# Patient Record
Sex: Female | Born: 1955 | Race: White | Hispanic: No | Marital: Married
Health system: Midwestern US, Academic
[De-identification: ages and names within clinical notes are randomized; demographics above are authoritative.]

---

## 2016-08-02 ENCOUNTER — Encounter: Admit: 2016-08-02 | Discharge: 2016-08-02 | Payer: Private Health Insurance - Indemnity

## 2016-08-02 NOTE — Telephone Encounter
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 Per her PCP's note, she also has chronic leukopenia with her most recent WBC count of 4.0 on 07/21/16, decreased from 4.7 on 05/18/16. She is now being referred to hematology for further evaluation.

## 2016-08-08 ENCOUNTER — Encounter: Admit: 2016-08-08 | Discharge: 2016-08-08 | Payer: Private Health Insurance - Indemnity

## 2016-08-08 DIAGNOSIS — E785 Hyperlipidemia, unspecified: Principal | ICD-10-CM

## 2016-08-08 DIAGNOSIS — Z87891 Personal history of nicotine dependence: ICD-10-CM

## 2016-08-08 DIAGNOSIS — D696 Thrombocytopenia, unspecified: ICD-10-CM

## 2016-08-08 DIAGNOSIS — N904 Leukoplakia of vulva: ICD-10-CM

## 2016-08-08 DIAGNOSIS — Z0389 Encounter for observation for other suspected diseases and conditions ruled out: ICD-10-CM

## 2016-08-08 DIAGNOSIS — D693 Immune thrombocytopenic purpura: ICD-10-CM

## 2016-08-08 DIAGNOSIS — D72819 Decreased white blood cell count, unspecified: ICD-10-CM

## 2016-08-08 DIAGNOSIS — I10 Essential (primary) hypertension: ICD-10-CM

## 2016-08-08 DIAGNOSIS — I519 Heart disease, unspecified: ICD-10-CM

## 2016-08-08 LAB — CBC AND DIFF
Lab: 0.1 10*3/uL (ref 0–0.20)
Lab: 0.1 10*3/uL (ref 0–0.45)
Lab: 0.4 10*3/uL (ref 0–0.80)
Lab: 1.3 10*3/uL (ref 1.0–4.8)
Lab: 29 % (ref 24–44)
Lab: 4 M/UL (ref 4.0–5.0)
Lab: 4.6 10*3/uL (ref 4.5–11.0)
Lab: 7.5 FL (ref 7–11)

## 2016-08-09 LAB — TSH WITH FREE T4 REFLEX: Lab: 1.5 uU/mL (ref 0.35–5.00)

## 2016-08-09 LAB — HEPATITIS PANEL, ACUTE
Lab: NEGATIVE % (ref 0–2)
Lab: NEGATIVE % (ref 0–5)
Lab: NEGATIVE % (ref 4–12)

## 2016-08-09 LAB — VITAMIN B12: Lab: 313 pg/mL — ABNORMAL LOW (ref 180–914)

## 2016-08-09 LAB — IRON + BINDING CAPACITY + %SAT+ FERRITIN
Lab: 207 ng/mL — ABNORMAL HIGH (ref 10–200)
Lab: 362 ug/dL (ref 270–380)
Lab: 75 ug/dL (ref 50–160)

## 2016-08-09 LAB — PERIPHERAL SMEAR

## 2016-08-09 LAB — FOLATE, SERUM: Lab: 12 ng/mL — ABNORMAL LOW (ref >3.9)

## 2016-08-16 ENCOUNTER — Encounter: Admit: 2016-08-16 | Discharge: 2016-08-16 | Payer: Private Health Insurance - Indemnity

## 2016-08-16 MED ORDER — LOSARTAN 25 MG PO TAB
25 mg | ORAL_TABLET | Freq: Every day | ORAL | 3 refills | 90.00000 days | Status: AC
Start: 2016-08-16 — End: 2017-03-23

## 2016-10-26 ENCOUNTER — Ambulatory Visit: Admit: 2016-10-26 | Discharge: 2016-10-26 | Payer: Private Health Insurance - Indemnity

## 2016-10-26 ENCOUNTER — Encounter: Admit: 2016-10-26 | Discharge: 2016-10-26 | Payer: Private Health Insurance - Indemnity

## 2016-10-26 DIAGNOSIS — I519 Heart disease, unspecified: ICD-10-CM

## 2016-10-26 DIAGNOSIS — N904 Leukoplakia of vulva: ICD-10-CM

## 2016-10-26 DIAGNOSIS — I10 Essential (primary) hypertension: ICD-10-CM

## 2016-10-26 DIAGNOSIS — Z0389 Encounter for observation for other suspected diseases and conditions ruled out: ICD-10-CM

## 2016-10-26 DIAGNOSIS — D693 Immune thrombocytopenic purpura: ICD-10-CM

## 2016-10-26 DIAGNOSIS — Z1231 Encounter for screening mammogram for malignant neoplasm of breast: Principal | ICD-10-CM

## 2016-10-26 DIAGNOSIS — D72819 Decreased white blood cell count, unspecified: ICD-10-CM

## 2016-10-26 DIAGNOSIS — E785 Hyperlipidemia, unspecified: Principal | ICD-10-CM

## 2016-10-26 NOTE — Progress Notes
Date of Service: 10/26/2016    Subjective:             Teresa Trevino is a 61 y.o. female.    History of Present Illness  For 6 month f/u. Flare LS started 10/11/16. Sore hard area with white pimple on right inner labia minora. Pain largely resolved. On clobetasol BID since. No bleeding,itching or dc     Review of Systems   Constitutional: Negative.  Negative for fatigue, fever and unexpected weight change.   HENT: Negative.    Eyes: Negative.    Respiratory: Negative.  Negative for cough and shortness of breath.    Cardiovascular: Negative.  Negative for chest pain and leg swelling.   Gastrointestinal: Negative.  Negative for abdominal pain, blood in stool, constipation, diarrhea, nausea and vomiting.   Endocrine: Negative.    Genitourinary: Negative.  Negative for difficulty urinating, dyspareunia, dysuria, enuresis, frequency, genital sores, hematuria, menstrual problem, pelvic pain, urgency, vaginal bleeding, vaginal discharge and vaginal pain.   Musculoskeletal: Negative.  Negative for arthralgias and back pain.   Skin: Negative.    Allergic/Immunologic: Negative.    Neurological: Negative.  Negative for light-headedness and headaches.   Hematological: Negative.  Negative for adenopathy. Does not bruise/bleed easily.   Psychiatric/Behavioral: Negative.  Negative for confusion. The patient is not nervous/anxious.    All other systems reviewed and are negative.    Past Medical History:   Diagnosis Date   ??? Chronic leukopenia    ??? Heart disease    ??? Hyperlipemia 08/20/2015   ??? Hypertension    ??? ITP (idiopathic thrombocytopenic purpura)     as a kid, resolved   ??? Lichen sclerosus et atrophicus of the vulva 09/22/2015   ??? Observation for suspected cardiovascular disease 08/20/2015    04/21/15  Stress Echo :  1.  No exercise induced chest pain.  2.  No ECG evidence of ischemia.  3.  No echocardiographic evidence of ischemia.  4.  Normal  Hemodynamic response to exercise.  5.  No significant exercise induced arrhythmias.  6.  Low risk scan.  7.  Consider medical management if indicated.     Past Surgical History:   Procedure Laterality Date   ??? HX HYSTERECTOMY  2008   ??? BLADDER SURGERY  2013    bladder sling   ??? COLONOSCOPY     ??? FOOT SURGERY     ??? TONSILLECTOMY       Social History   Substance Use Topics   ??? Smoking status: Former Smoker     Types: Cigarettes   ??? Smokeless tobacco: Never Used      Comment: only smoked very briefly asa teen   ??? Alcohol use 1.0 oz/week     2 Standard drinks or equivalent per week     Family History   Problem Relation Age of Onset   ??? Diabetes Mother    ??? Hypertension Mother    ??? High Cholesterol Mother    ??? Stroke Mother    ??? Thyroid Disease Mother    ??? Kidney Failure Mother         on HD   ??? Diabetes Father         since age 3   ??? Heart Failure Father    ??? Diabetes Paternal Uncle    ??? Cancer Maternal Grandmother    ??? Diabetes Maternal Grandfather    ??? None Reported Sister          Objective:         ???  ALPRAZolam (XANAX) 0.25 mg tablet Take 0.25 mg by mouth at bedtime as needed.   ??? clobetasol (TEMOVATE) 0.05 % topical ointment Apply to vulva for flares twice daily for 4 weeks then taper as directed   ??? ergocalciferol (VITAMIN D-2) 50,000 unit capsule Take 1 Cap by mouth every 7 days.   ??? estrogens, conjugated(+) (PREMARIN) 0.625 mg/g vaginal cream Apply 0.5 gms to the vaginal opening daily for 2 weeks then twice weekly for maintenance   ??? losartan (COZAAR) 25 mg tablet Take 1 tablet by mouth daily.   ??? omeprazole DR(+) (PRILOSEC) 20 mg capsule Take 20 mg by mouth daily before breakfast.   ??? rosuvastatin (CRESTOR) 20 mg tablet Take 1 tablet by mouth daily.     Vitals:    10/26/16 1325   Weight: 88.7 kg (195 lb 9.6 oz)   Height: 176.8 cm (69.6)     Body mass index is 28.39 kg/m???.     Physical Exam  Pleasant female in NAD  Normal external female genitalia  Vulva wickham's striae at hart's line bilaterally without mass  Vagina physiologic d/c no lesions Cervix uterus and adnexa absent           Assessment and Plan:  LS flare. topifcal clobetasol BID x 2 more weeks then taper as directed. Recommend BIW application clobetasol at baseline to prevent VIN/SCCA

## 2016-11-15 ENCOUNTER — Encounter: Admit: 2016-11-15 | Discharge: 2016-11-15 | Payer: Private Health Insurance - Indemnity

## 2016-11-15 DIAGNOSIS — E781 Pure hyperglyceridemia: Principal | ICD-10-CM

## 2016-11-24 ENCOUNTER — Encounter: Admit: 2016-11-24 | Discharge: 2016-11-24 | Payer: Private Health Insurance - Indemnity

## 2016-11-24 DIAGNOSIS — E781 Pure hyperglyceridemia: Principal | ICD-10-CM

## 2016-11-24 LAB — LIPID PROFILE
Lab: 155
Lab: 210
Lab: 38
Lab: 113

## 2016-11-24 NOTE — Progress Notes
Labs are to be reviewed at the patients upcoming office visit.  , RN

## 2016-12-19 ENCOUNTER — Encounter: Admit: 2016-12-19 | Discharge: 2016-12-19 | Payer: Private Health Insurance - Indemnity

## 2016-12-19 DIAGNOSIS — R69 Illness, unspecified: Principal | ICD-10-CM

## 2016-12-20 ENCOUNTER — Ambulatory Visit: Admit: 2016-12-20 | Discharge: 2016-12-20 | Payer: Private Health Insurance - Indemnity

## 2016-12-20 DIAGNOSIS — Z1231 Encounter for screening mammogram for malignant neoplasm of breast: Principal | ICD-10-CM

## 2016-12-23 ENCOUNTER — Encounter: Admit: 2016-12-23 | Discharge: 2016-12-23 | Payer: Private Health Insurance - Indemnity

## 2016-12-23 DIAGNOSIS — R928 Other abnormal and inconclusive findings on diagnostic imaging of breast: Principal | ICD-10-CM

## 2017-01-04 ENCOUNTER — Encounter: Admit: 2017-01-04 | Discharge: 2017-01-04 | Payer: Private Health Insurance - Indemnity

## 2017-01-04 ENCOUNTER — Ambulatory Visit: Admit: 2017-01-04 | Discharge: 2017-01-05 | Payer: Private Health Insurance - Indemnity

## 2017-01-04 ENCOUNTER — Ambulatory Visit: Admit: 2017-01-04 | Discharge: 2017-01-04 | Payer: Private Health Insurance - Indemnity

## 2017-01-04 DIAGNOSIS — R928 Other abnormal and inconclusive findings on diagnostic imaging of breast: Principal | ICD-10-CM

## 2017-01-04 NOTE — Progress Notes
Helped pt get scheduled for biopsy 01/19/17 Verified time, date and location of service. Pt agreed to arrive at 1230 for a 1300 procedure start time. Pt educated on procedure and post-procedure care. Confirmed that pt does not take ASA or Anticoagulant. Urged pt to eat breakfast and wear a supportive bra to appointment. Pt states understanding and denies any questions or concerns at this time.

## 2017-01-17 NOTE — Progress Notes
Called pt to confirm appointment with [facility] on 01/19/17. Pt did not answer. Left message containing date, time, address, call back number. Asked pt to arrive at 1230 for a 1300 procedure start time.

## 2017-01-18 ENCOUNTER — Encounter: Admit: 2017-01-18 | Discharge: 2017-01-18 | Payer: Private Health Insurance - Indemnity

## 2017-01-18 DIAGNOSIS — N63 Unspecified lump in unspecified breast: Principal | ICD-10-CM

## 2017-01-19 ENCOUNTER — Ambulatory Visit: Admit: 2017-01-19 | Discharge: 2017-01-20 | Payer: Private Health Insurance - Indemnity

## 2017-01-19 ENCOUNTER — Ambulatory Visit: Admit: 2017-01-19 | Discharge: 2017-01-19 | Payer: Private Health Insurance - Indemnity

## 2017-01-19 DIAGNOSIS — N63 Unspecified lump in unspecified breast: Principal | ICD-10-CM

## 2017-01-19 DIAGNOSIS — R928 Other abnormal and inconclusive findings on diagnostic imaging of breast: ICD-10-CM

## 2017-01-19 MED ORDER — LIDOCAINE 1% (BUFFERED) SYRINGE (BREAST CENTER)
1 mL | Freq: Once | INTRAMUSCULAR | 0 refills | Status: CP
Start: 2017-01-19 — End: ?

## 2017-01-19 MED ORDER — LIDOCAINE 1%-EPINEPHRINE 1:100000 (BUFFERED) VIAL (BREAST CENTER)
7 mL | Freq: Once | INTRAMUSCULAR | 0 refills | Status: CP
Start: 2017-01-19 — End: ?

## 2017-01-19 NOTE — Progress Notes
1320- Pt arrived in [place] in stable condition. Pt educated on Ultrasound Breast Biopsy, procedure, possible complications. Pt states understanding and denies questions or concerns. Pt verified does not take aspirin or anticoagulant.   1340- Consent signes with Dr. Francesco Sor, Pt, this RN. Skin above right breast marked per protocol. Pt denies questions at this time, agrees to proceed.  1410- Stereotactic biopsy complete, pressure held to biopsy site x10 minutes. Insicion closed with skin glue. Discharge instructions given at this time. Pt provided with discharge instruction sheet, phone number and Ice pack. Post clip placement mammogram to follow.

## 2017-01-20 ENCOUNTER — Encounter: Admit: 2017-01-20 | Discharge: 2017-01-20 | Payer: Private Health Insurance - Indemnity

## 2017-01-20 NOTE — Progress Notes
I spoke with Teresa Trevino and relayed her pathology results.  Unfortunately, she has been diagnosed with breast cancer. Emotional support provided, and I gave her my call back number for any questions prior to her appointment.  I informed her that I have relayed this information to her ordering provider.  She verbalized understanding and all of her questions were answered.  I transferred her to Breast Nurse Navigation, so that she could make the appropriate appointments.

## 2017-01-24 ENCOUNTER — Encounter: Admit: 2017-01-24 | Discharge: 2017-01-24 | Payer: Private Health Insurance - Indemnity

## 2017-01-24 DIAGNOSIS — R69 Illness, unspecified: Principal | ICD-10-CM

## 2017-01-24 NOTE — Telephone Encounter
Navigation Intake Assessment    Patient Name:  Teresa Trevino  DOB: October 22, 1955  Insurance:  [facility]  Direct Referral: NA  Appointment Info:    Future Appointments   Date Time Provider Department Center   01/25/2017 10:15 AM Ruthy Dick, DO CCC2 [place] Exam   01/26/2017  1:20 PM Alwyn Pea, DO [facility] [place] Exam   01/30/2017  2:00 PM Sabino Gasser, MD [facility] [place]   05/17/2017  1:30 PM Tilden Fossa, MD [facility]         Diagnosis & Reason for Visit:  Invasive ductal carcinoma, right breast, grade 3    Physician Info:  ? Referring Physician:  Rockwell Germany PA  ??? Contact Name & Number:  [phone]  ??? Surgeon:  NA  ??? Medical Oncologist:  NA  ??? Radiation Oncologist:  NA  ??? PCP:  Melissa Huntington PA  ??? Other:   NA    Location of Films:   IN Lobbyist of Pathology:  IN HOUSE  History of Present Illness:  Patient is a 61 yo female referred to medical and surgical oncology for invasive ductal carcinoma of the right breast. No prior history of any breast biopsies or breast surgery.   TIMELINE:  12/20/2016 Screening/Tomo Bilateral Mammogram   [place]    01/04/2017 Diagnostic Right Mammogram   [place]    01/04/2017 [place] Right Breast  [place]    01/19/2017 US-Guided Right Breast Needle Biopsy   [facility]  A. Breast, right breast 4:00, 4cm FTN, needle biopsy:   Invasive ductal carcinoma, histologic grade 3.       Family History of Breast Cancer:   Maternal Grandmother- Breast cancer dx age late 49s  No known family history of ovarian or prostate cancers.    Personal History of Other Cancers: Patient denies a personal history of any other cancers        NEEDS Assessment:    Genetic Counseling:  Assessment:  Genetic Assessment: No identified risk factors      Intervention:  Genetic Intervention: Provided information about available services     Nutrition:  Assessment:  Recent Weight Loss Without Trying?: No  Eating Poorly Due to Decreased Appetite?: No  Score: Malnutrition Screening Tool (MST): 0 Intervention:  Nutrition Intervention: Provided information about available services    Social & Financial:  Assessment:  Social and Financial Assessment: Reports adequate support system;No needs identified    Intervention:  Social and Financial Intervention: Provided information about available services;Services declined at this time       Spiritual & Emotional:  Assessment:   Spiritual and Emotional Assessment: Reports adequate support system;No needs identified    Intervention:  Spiritual and Emotional Intervention: Provided information about available services;Services declined at this time    Physical:  Assessment:  Fall Risk: None identified       Communication:  Assessment:  Communication Barrier: No       Onc Fertility:   Assessment:  Onc Fertility Assessment: Not applicable

## 2017-01-25 ENCOUNTER — Encounter: Admit: 2017-01-25 | Discharge: 2017-01-25 | Payer: Private Health Insurance - Indemnity

## 2017-01-25 DIAGNOSIS — N904 Leukoplakia of vulva: ICD-10-CM

## 2017-01-25 DIAGNOSIS — I10 Essential (primary) hypertension: ICD-10-CM

## 2017-01-25 DIAGNOSIS — C50311 Malignant neoplasm of lower-inner quadrant of right female breast: Principal | ICD-10-CM

## 2017-01-25 DIAGNOSIS — Z87891 Personal history of nicotine dependence: ICD-10-CM

## 2017-01-25 DIAGNOSIS — D693 Immune thrombocytopenic purpura: ICD-10-CM

## 2017-01-25 DIAGNOSIS — D72819 Decreased white blood cell count, unspecified: ICD-10-CM

## 2017-01-25 DIAGNOSIS — Z171 Estrogen receptor negative status [ER-]: ICD-10-CM

## 2017-01-25 DIAGNOSIS — C50919 Malignant neoplasm of unspecified site of unspecified female breast: ICD-10-CM

## 2017-01-25 DIAGNOSIS — I519 Heart disease, unspecified: ICD-10-CM

## 2017-01-25 DIAGNOSIS — Z01818 Encounter for other preprocedural examination: Secondary | ICD-10-CM

## 2017-01-25 DIAGNOSIS — E785 Hyperlipidemia, unspecified: Principal | ICD-10-CM

## 2017-01-25 DIAGNOSIS — Z0389 Encounter for observation for other suspected diseases and conditions ruled out: ICD-10-CM

## 2017-01-25 LAB — COMPREHENSIVE METABOLIC PANEL
Lab: 135 MMOL/L — ABNORMAL LOW (ref 137–147)
Lab: 4.3 MMOL/L (ref 3.5–5.1)

## 2017-01-25 LAB — CBC AND DIFF
Lab: 14 g/dL (ref 12.0–15.0)
Lab: 32 pg (ref 26–34)
Lab: 34 g/dL (ref 32.0–36.0)
Lab: 4.3 M/UL (ref 4.0–5.0)
Lab: 41 % (ref 36–45)
Lab: 5.5 K/UL (ref 4.5–11.0)
Lab: 94 FL (ref 80–100)

## 2017-01-25 NOTE — Progress Notes
Name: Teresa Trevino          MRN: [id number]      DOB: [birth year]      AGE: 61 y.o.   DATE OF SERVICE: 01/25/2017                    Cancer Staging  Malignant neoplasm of lower-inner quadrant of right breast of female, estrogen receptor negative (HCC)  Staging form: Breast, AJCC 8th Edition  - Clinical stage from 01/19/2017: Stage IB (cT1b, cN0, cM0, G3, ER: Negative, PR: Negative, HER2: Negative) - Signed by Dimas Alexandria, PA-C on 01/24/2017    DIAGNOSIS:  Right grade 3 IDC (ER/PR0%, HER2 1+, Ki-67 88%) at 4:00, dx 01/2017    History of Present Illness        Teresa Trevino is a caucasian female who presented to the [facility] on 01/25/2017 at age 61 for evaluation of right breast cancer. Teresa Trevino had no complaints prior to her screening mammogram. A new right breast asymmetry was present on screening mammogram. There are was suspicious on diagnostic imaging and biopsy was recommended. Right breast sono-guided biopsy 01/19/17 ([employer]) revealed grade 3 invasive ductal carcinoma.    BREAST IMAGING:  Mammogram:    -- Bilateral screening mammogram 12/20/16 ([facility]) revealed scattered fibroglandular densities. There was a focal asymmetry present within the central posterior right breast. Additional diagnostic images and ultrasound were recommended. No abnormalities were present on the left breast.  -- Right diagnostic mammogram 01/04/17 ([facility]) revealed an approximately 1.2 cm x 0.6 cm x 0.5 cm low-density mass at 4:00 with associated calcifications. This was considered suspicious and further evaluation with ultrasound will be performed.    Ultrasound:    -- Targeted right breast ultrasound 01/04/17 ([place]) revealed at 4:00 there was an irregular hypoechoic mass measuring approximately 6 mm x 9 mm x 8 mm with associated microcalcifications. This was thought to likely correlate with the mammographic abnormality and was considered somewhat suspicious. Ultrasound-guided biopsy was recommended. If the clip marker form sono guided biopsy of this mass did not correlate to the mammographic finding, additional stereotactic biopsy might be required. These findings recommendations were discussed in detail with the patient at the time of today's procedure.     REPRODUCTIVE HEALTH:  Age at first Menarche: Unknown   Age at Menopause:  Hysterectomy/BSO at 21, HRT cream for several months    PROCEDURE: pending  PERTINENT PMH:  HTN, chronic leukopenia  FAMILY HISTORY:  Maternal Grandmother- Breast cancer dx age late 50s  PHYSICAL EXAM on PRESENTATION: Right - No palpable breast masses. No skin, nipple, or areolar change. Left - No palpable breast masses. No skin, nipple, or areolar change. No supraclavicular or axillary adenopathy.   MEDICAL ONCOLOGY:  Dr. Olin Pia  REFERRED BY:  [facility]       Review of Systems    Constitutional: Negative for fever, chills, appetite change and fatigue.   HENT: Negative for hearing loss, congestion, rhinorrhea and tinnitus.    Eyes: Negative for pain, discharge and itching.   Respiratory: Negative for cough, chest tightness and shortness of breath.    Cardiovascular: Negative for chest pain and palpitations.   Gastrointestinal: Negative for abdominal distention, pain, nausea, vomiting, and diarrhea.   Genitourinary: Negative for frequency, vaginal bleeding, difficulty urinating and pelvic pain.   Musculoskeletal: Negative for myalgias, back pain, joint swelling and arthralgias.   Skin: Negative for rash.   Neurological: Negative for dizziness, weakness, light-headedness and headaches.  Hematological: Does not bruise/bleed easily.   Psychiatric/Behavioral: Negative for disturbed wake/sleep cycle. The patient is not nervous/anxious.    Allergies   Allergen Reactions   ??? Erythromycin NAUSEA AND VOMITING   ??? Macrobid [Nitrofurantoin Monohyd/M-Cryst] NAUSEA AND VOMITING   ??? Bactrim [Sulfamethoxazole-Trimethoprim] SEE COMMENTS     Makes me feel ill       Past Medical History: Diagnosis Date   ??? Breast cancer (HCC) 01/2017    right IDC   ??? Chronic leukopenia    ??? Heart disease    ??? Hyperlipemia 08/20/2015   ??? Hypertension    ??? ITP (idiopathic thrombocytopenic purpura)     as a kid, resolved   ??? Lichen sclerosus et atrophicus of the vulva 09/22/2015   ??? Observation for suspected cardiovascular disease 08/20/2015    04/21/15  Stress Echo :  1.  No exercise induced chest pain.  2.  No ECG evidence of ischemia.  3.  No echocardiographic evidence of ischemia.  4.  Normal  Hemodynamic response to exercise.  5.  No significant exercise induced arrhythmias.  6.  Low risk scan.  7.  Consider medical management if indicated.     Past Surgical History:   Procedure Laterality Date   ??? HX HYSTERECTOMY  2008   ??? BLADDER SURGERY  2013    bladder sling   ??? COLONOSCOPY     ??? FOOT SURGERY     ??? TONSILLECTOMY       Family History   Problem Relation Age of Onset   ??? Diabetes Mother    ??? Hypertension Mother    ??? High Cholesterol Mother    ??? Stroke Mother    ??? Thyroid Disease Mother    ??? Kidney Failure Mother         on HD   ??? Diabetes Father         since age 59   ??? Heart Failure Father    ??? Diabetes Paternal Uncle    ??? Cancer Maternal Grandmother    ??? Diabetes Maternal Grandfather    ??? None Reported Sister      Social History     Socioeconomic History   ??? Marital status: Married     Spouse name: Not on file   ??? Number of children: Not on file   ??? Years of education: Not on file   ??? Highest education level: Not on file   Social Needs   ??? Financial resource strain: Not on file   ??? Food insecurity - worry: Not on file   ??? Food insecurity - inability: Not on file   ??? Transportation needs - medical: Not on file   ??? Transportation needs - non-medical: Not on file   Occupational History   ??? Not on file   Tobacco Use   ??? Smoking status: Former Smoker     Types: Cigarettes   ??? Smokeless tobacco: Never Used   ??? Tobacco comment: only smoked very briefly asa teen   Substance and Sexual Activity   ??? Alcohol use: Yes Alcohol/week: 1.0 oz     Types: 2 Standard drinks or equivalent per week   ??? Drug use: No   ??? Sexual activity: Yes     Partners: Male   Other Topics Concern   ??? Not on file   Social History Narrative   ??? Not on file           Objective:         ???  ALPRAZolam (XANAX) 0.25 mg tablet Take 0.25 mg by mouth at bedtime as needed.   ??? clobetasol (TEMOVATE) 0.05 % topical ointment Apply to vulva for flares twice daily for 4 weeks then taper as directed   ??? ergocalciferol (VITAMIN D-2) 50,000 unit capsule Take 1 Cap by mouth every 7 days.   ??? estrogens, conjugated(+) (PREMARIN) 0.625 mg/g vaginal cream Apply 0.5 gms to the vaginal opening daily for 2 weeks then twice weekly for maintenance   ??? losartan (COZAAR) 25 mg tablet Take 1 tablet by mouth daily.   ??? omeprazole DR(+) (PRILOSEC) 20 mg capsule Take 20 mg by mouth daily before breakfast.     Vitals:    01/25/17 1022 01/25/17 1026   BP: (P) 155/74    Pulse: (P) 75    Resp: (P) 16    Temp: (P) 36.8 ???C (98.2 ???F)    TempSrc: (P) Oral    SpO2: (P) 100%    Weight: (P) 85.4 kg (188 lb 3.2 oz)    Height: (P) 176.8 cm (69.61) 174.5 cm (68.7)     Body mass index is 28.03 kg/m??? (pended).     Pain Score: (P) Zero         Pain Addressed:  N/A    Patient Evaluated for a Clinical Trial: Discussed clinical trial evaluation with patient and patient declines     [place] Cooperative Oncology Group performance status is 0, Fully active, able to carry on all pre-disease performance without restriction.Marland Kitchen     Physical Exam   Vitals reviewed.  RIGHT BREAST EXAM:  Breast:  No palpable masses  Skin Erythema:  No  Attachment of Overlying Skin:  No  Peau d' orange:  No  Chest Wall Attachment:  No  Nipple Inversion:  No  Nipple Discharge: No    LEFT BREAST EXAM:  Breast: No palpable masses  Skin Erythema:  No  Attachment of Overlying Skin:  No  Peau d' orange:  No  Chest Wall Attachment: No  Nipple Inversion:  No  Nipple Discharge:  No    RIGHT NODAL BASIN EXAM:  Axillary:  negative Infraclavicular:  negative  Supraclavicular:  negative    LEFT NODAL BASIN EXAM:  Axillary:  negative  Infraclavicular: negative  Supraclavicular:  negative    Constitutional: Well-developed and well-nourished. No acute distress.  HEENT:  Head: Normocephalic and atraumatic.  Ears: Tympanic membranes pearly-gray. No effusion.  Eyes: Pupils are equal, round and reactive to light. No discharge. No scleral icterus.  Cardiovascular: Normal rate, regular rhythm and normal heart sounds. No murmur or gallop.  Pulmonary/Chest: Effort normal and breath sounds normal. No respiratory distress. No wheezes. No rales.   Musculoskeletal: Normal range of motion. No edema.  Neurological: Alert and oriented to person, place and time. No cranial nerve deficit.  Skin: Warm and dry. No rash noted. No erythema. No pallor.  Psychiatric: Normal mood and affect. Behavior is normal. Judgement and thought content normal.              Assessment and Plan:  Right grade 3 IDC (ER/PR0%, HER2 1+, Ki-67 88%) at 4:00, dx 01/2017    The diagnosis, including type of breast cancer, tumor markers, grade, and stage were discussed today. We discussed the need for a multidisciplinary approach to breast cancer treatment with local and systemic therapy.  We reviewed her imaging results including the additional targeted imaging. Breast MRI was discussed including the breast MRI trial we are currently enrolling. It was explained that the advantage  of breast MRI is increased sensitivity over other breast imaging and therefore, may find something that was not previously seen. The disadvantage, is that due to the increased sensitivity there is a 20% rate of false positives, which may lead to additional biopsies that are negative. There is a 10-15% risk of finding another breast cancer in the ipsilateral breast and a 3-4% chance in the contralateral breast. Ms. Fassnacht is not interested in participating in the trial. We then discussed coordination of local and systemic therapy. Chemotherapy can be given in the neoadjuvant or adjuvant setting. Based on the very small size of the cancer she would most benefit from proceeding with surgery first to obtain complete pathologic staging to include the pathologic tumor size and nodal assessment which can help tailor the chemotherapy needed. We then discussed local treatment options in detail. Discussed options of BCT versus mastectomy. Similar risk of local/regional recurrence with BCT and mastectomy, with no survival benefit for mastectomy.  Lumpectomy was discussed in detail including the use of local tissue rearrangement to improve cosmesis and reduce seroma formation. The details of mastectomy with and without reconstruction were discussed including paresthesia, NAC removal and reconstruction as a phased process. Ms. Cypert is an excellent candidate for BCT and would like to proceed. A SLNB at the time of lumpectomy is recommended to evaluate for nodal metastasis and an ALND if a SLN is found to be positive. We discussed the risk of arm lymphedema associated with nodal surgery and breast lymphedema with RT. She will be seen in the Lymphedema Clinic pre op for education and measurements. We will refer her to radiation oncology post op. Ms. Liggins has a history of chronic leukopenia and has seen Dr. Olin Pia in the past and did not need further follow up. She has a follow up with Dr. Freida Busman tomorrow for treatment of her new breast cancer. Ms. Trembley has a history of hypertension and an elevated calcium score. She follows with Dr. Doristine Counter in cardiology. She has an appointment with him on Monday, 01/30/17, and we will contact his office to obtain a letter for cardiac clearance. We discussed genetic testing, but do not feel Ms. Wickware qualifies at this time base on her personal and family history. Ms. Cale will have a CBC and CMP today for surgical clearance. She is to return to the clinic 1-2 weeks post op. Ms. Omeara, her husband and daughter expressed their understanding of our conversation. They were given ample time to ask questions all of which were answered to their satisfaction.    1. Right RSL lumpectomy/SLNB/pALND  2. Keep appointment with Dr. Freida Busman  3. Refer to radiation oncology post op  4. Lymphedema Clinic pre op  5. CBC/CMP today  6. Cardiac clearance from Dr. Doristine Counter  7. RTC 1-2 weeks post op    Dimas Alexandria, PA-C  ATTESTATION    I personally performed the key portions of the E/M visit, discussed case with Physician Assistant and concur with documentation of history, physical exam, assessment, and treatment plan unless otherwise noted.    Staff name:  Ruthy Dick, DO Date:  01/25/2017

## 2017-01-25 NOTE — Progress Notes
Spoke with Ms. Binion regarding RIGHT RSL Lumpectomy, IOLM SLNB and the confirmed surgery date of 02/20/17.  The patient was informed that she would receive a call from the surgery department the day prior to surgery to confirm time of arrival.  The patient was given detailed instructions about where and when to check in the day of surgery.    A pre op packet describing the surgical procedure and pre and post operative instructions has been given to the patient and reviewed in detail. Informed pt that we will see her 1-2 weeks post op to review final pathology report in detail and assess surgical recovery.     The patient has been informed of the medications that need to be stopped 7-10 days prior to surgery and the NPO requirements starting at midnight the night before surgery. Also informed that a driver will need to accompany pt due to the influence of anesthesia including narcotics post procedure. Informed pt that the PAT department will call to discuss lab work, medication review, health history, anesthesia/ surgical history, and any other additional recommended testing for clearance for surgery.     The patient verbalized understanding of the information given.  The patient was encouraged to call with any questions or concerns.

## 2017-01-26 ENCOUNTER — Encounter: Admit: 2017-01-26 | Discharge: 2017-01-26 | Payer: Private Health Insurance - Indemnity

## 2017-01-26 DIAGNOSIS — C50311 Malignant neoplasm of lower-inner quadrant of right female breast: Principal | ICD-10-CM

## 2017-01-26 DIAGNOSIS — I10 Essential (primary) hypertension: ICD-10-CM

## 2017-01-26 DIAGNOSIS — Z171 Estrogen receptor negative status [ER-]: ICD-10-CM

## 2017-01-26 DIAGNOSIS — D72819 Decreased white blood cell count, unspecified: ICD-10-CM

## 2017-01-26 DIAGNOSIS — N904 Leukoplakia of vulva: ICD-10-CM

## 2017-01-26 DIAGNOSIS — D693 Immune thrombocytopenic purpura: ICD-10-CM

## 2017-01-26 DIAGNOSIS — D696 Thrombocytopenia, unspecified: ICD-10-CM

## 2017-01-26 DIAGNOSIS — Z0389 Encounter for observation for other suspected diseases and conditions ruled out: ICD-10-CM

## 2017-01-26 DIAGNOSIS — C50919 Malignant neoplasm of unspecified site of unspecified female breast: ICD-10-CM

## 2017-01-26 DIAGNOSIS — I519 Heart disease, unspecified: ICD-10-CM

## 2017-01-26 DIAGNOSIS — E785 Hyperlipidemia, unspecified: Principal | ICD-10-CM

## 2017-01-26 MED ORDER — APREPITANT 7.2 MG/ML IV EMUL
130 mg | Freq: Once | INTRAVENOUS | 0 refills | Status: CN
Start: 2017-01-26 — End: ?

## 2017-01-26 MED ORDER — DEXAMETHASONE IVPB
12 mg | Freq: Once | INTRAVENOUS | 0 refills | Status: CN
Start: 2017-01-26 — End: ?

## 2017-01-26 MED ORDER — PEGFILGRASTIM 6 MG/0.6ML SC SYRG
6 mg | Freq: Once | SUBCUTANEOUS | 0 refills | Status: CN
Start: 2017-01-26 — End: ?

## 2017-01-26 MED ORDER — PALONOSETRON 0.25 MG/5 ML IV SOLN
.25 mg | Freq: Once | INTRAVENOUS | 0 refills | Status: CN
Start: 2017-01-26 — End: ?

## 2017-01-26 MED ORDER — DOCETAXEL IVPB
75 mg/m2 | Freq: Once | INTRAVENOUS | 0 refills | Status: CN
Start: 2017-01-26 — End: ?

## 2017-01-26 MED ORDER — ONDANSETRON HCL 4 MG PO TAB
4 mg | ORAL_TABLET | ORAL | 1 refills | 8.00000 days | Status: AC | PRN
Start: 2017-01-26 — End: 2017-06-22

## 2017-01-26 MED ORDER — LIDOCAINE-PRILOCAINE 2.5-2.5 % TP CREA
0 refills | Status: AC | PRN
Start: 2017-01-26 — End: 2017-06-28

## 2017-01-26 MED ORDER — CARBOPLATIN IVPB (BY AUC-SWOG)
762.6 mg | Freq: Once | INTRAVENOUS | 0 refills | Status: CN
Start: 2017-01-26 — End: ?

## 2017-01-26 MED ORDER — OLANZAPINE 10 MG PO TAB
ORAL_TABLET | ORAL | 0 refills | 30.00000 days | Status: AC
Start: 2017-01-26 — End: 2017-06-22

## 2017-01-26 NOTE — Progress Notes
Social Work Case Management Note    PLAN:  New patient assessment- support & financial resources for breast cancer patients, [place].      INTERVENTIONS:   Review of patient & information, Dx with breast cancer. Patient is married, lives in [place], [place], [place]. Patient has a daughter. Patient has [employer] with Rx. SW provided information on support & financial resources for breast cancer patients & [facility]. SW checked mileage and patient is 50 miles away so is eligible for [facility]. Introduced SW role, services & number, will follow.      Anice Paganini,  LMSW  Social Work Case Manager  Phone: [phone]

## 2017-01-26 NOTE — Progress Notes
01/26/2017    Name: Teresa Trevino  DOB: [birth year]  MRN: [id number]    Primary Care Physician: Rockwell Germany   Surgeon: Dr. Ruthy Dick     Chief Complaint:   Chief Complaint   Patient presents with   ??? Heme/Onc Care       Hematology/Oncology History:  Cancer Staging  Malignant neoplasm of lower-inner quadrant of right breast of female, estrogen receptor negative (HCC)  Staging form: Breast, AJCC 8th Edition  - Clinical stage from 01/19/2017: Stage IB (cT1b, cN0, cM0, G3, ER: Negative, PR: Negative, HER2: Negative) - Signed by Dimas Alexandria, PA-C on 01/24/2017    1.  History of chronic intermittent leukopenia and thrombocytopenia, clinically insignificant.  May have autoimmune component and appeared to be her baseline.  However, labs in 01/2017 were unremarkable.  2.  November 2018: Noted on screening mammogram to have 1.2 cm abnormality in the right breast.  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  3.  Current plan: Neoadjuvant carboplatin/docetaxel to be followed by surgery/radiation    Interval Events  Teresa Trevino returns to the clinic today with her husband and daughter for further discussion on her recently found breast cancer.  I had previously followed her for mild cytopenias which have resolved on her most recent labs.  I did again review and update her past medical, surgical, family, and social histories.  I also reviewed both PCP and surgical oncology notes, mammograms, ultrasound, labs, pathology for the visit today.  This small lesion in her right breast was found on routine screening mammography.  She has not had any recurrent headaches, vision changes, shortness of breath, chest pain, abdominal pain.  She chronically has had some low back pain on her left for several years.  She saw Dr. Loreta Ave yesterday and is planning to undergo lumpectomy in the future.  She is interested in neoadjuvant chemotherapy.    Past medical, surgical, and social histories were reviewed and updated. See EMR.      Medications    Current Outpatient Medications:   ???  ALPRAZolam (XANAX) 0.25 mg tablet, Take 0.25 mg by mouth at bedtime as needed., Disp: , Rfl:   ???  clobetasol (TEMOVATE) 0.05 % topical ointment, Apply to vulva for flares twice daily for 4 weeks then taper as directed, Disp: 30 g, Rfl: 2  ???  ergocalciferol (VITAMIN D-2) 50,000 unit capsule, Take 1 Cap by mouth every 7 days., Disp: , Rfl:   ???  lidocaine/prilocaine (EMLA) 2.5/2.5 % topical cream, Apply to port site as needed before chemotherapy, Disp: 30 g, Rfl: 0  ???  losartan (COZAAR) 25 mg tablet, Take 1 tablet by mouth daily., Disp: 90 tablet, Rfl: 3  ???  OLANZapine (ZYPREXA) 10 mg tablet, Take 1 tab each evening on days 1-4 of each cycle, Disp: 30 tablet, Rfl: 0  ???  omeprazole DR(+) (PRILOSEC) 20 mg capsule, Take 20 mg by mouth daily before breakfast., Disp: , Rfl:   ???  ondansetron (ZOFRAN) 4 mg tablet, Take one tablet by mouth every 6 hours as needed for Nausea or Vomiting., Disp: 60 tablet, Rfl: 1     Allergies:   Allergies   Allergen Reactions   ??? Erythromycin NAUSEA AND VOMITING   ??? Macrobid [Nitrofurantoin Monohyd/M-Cryst] NAUSEA AND VOMITING   ??? Bactrim [Sulfamethoxazole-Trimethoprim] SEE COMMENTS     Makes me feel ill       Review of Systems  Review of Systems   Constitutional: Negative.    HENT: Negative.  Eyes: Negative.    Respiratory: Negative.    Cardiovascular: Negative.    Gastrointestinal: Negative.    Endocrine: Negative.    Genitourinary: Negative.    Musculoskeletal: Positive for arthralgias and joint swelling.   Skin: Negative.    Allergic/Immunologic: Negative.    Neurological: Negative.    Hematological: Negative.    Psychiatric/Behavioral: Negative.    All other systems reviewed and are negative.      Pain Score: 0     Pain Addressed: N/A    Patient Evaluated for a Clinical Trial: No treatment clinical trial available for this patient. Size < 1 cm on ultrasound [place] Cooperative Oncology Group performance status is 0, Fully active, able to carry on all pre-disease performance without restriction.    Physical Exam  Vitals:    01/26/17 1313 01/26/17 1314   BP: 142/82    Pulse: 105    Resp: 18    Temp: 36.7 ???C (98 ???F)    TempSrc: Oral Oral   SpO2: 98%    Weight: 87.6 kg (193 lb 3.2 oz)    Height: 174.5 cm (68.7)       Physical Exam   Constitutional: She is oriented to person, place, and time and well-developed, well-nourished, and in no distress. No distress.   Eyes: EOM are normal. No scleral icterus.   Cardiovascular: Normal rate, regular rhythm, normal heart sounds and intact distal pulses.   Pulmonary/Chest: Effort normal and breath sounds normal. No respiratory distress. She has no wheezes. Right breast exhibits no inverted nipple, no mass, no nipple discharge, no skin change and no tenderness. Left breast exhibits no inverted nipple, no mass, no nipple discharge, no skin change and no tenderness.   Abdominal: Soft. She exhibits no distension. There is no tenderness.   Musculoskeletal: She exhibits no edema.   Lymphadenopathy:     She has no cervical adenopathy.     She has no axillary adenopathy.        Right: No supraclavicular adenopathy present.        Left: No supraclavicular adenopathy present.   Neurological: She is alert and oriented to person, place, and time. No cranial nerve deficit. She exhibits normal muscle tone. Gait normal. Coordination normal.   Skin: No pallor.   Psychiatric: Mood, memory, affect and judgment normal.   Vitals reviewed.         Labs/ Imaging /Pathology     Reviewed as above    Assessment & Plan:  Teresa Trevino is a 61 year old female with the following medical problems:    1.  T1b N0 M0 stage IB triple negative cancer of the RIGHT breast  2.  Prior mild leukopenia, thrombocytopenia-- Normal on recent labs.    Current plan:  1.  Curative neoadjuvant IV carboplatin/docetaxel every 3 weeks x 6 cycles. 2.  Referral to Dr. Arlana Pouch, general surgeon in [place], for a port placement.  3.  NP teach   4.  Repeat CBC, CMP on day of NP teach.  5.  Prescription sent to her local pharmacy for dexamethasone, olanzapine, Zofran, EMLA cream.  6.  If possible, she would like to receive Neulasta at home with her daughter, who is a Engineer, civil (consulting). Will attempt insurance authorization.  7.  In the future, she plans to undergo lumpectomy/radiation after chemo is completed.  8.  I will see her again for a 1 week toxicity check and also on return just prior to cycle 2.    Thank you for allowing me to  participate in her care.    I spent 45 minutes with the patient and her husband from 120 to 2:05 PM discussing her diagnosis, staging, prognosis, treatment options, general side effects, plans for monitoring.  >50% of the time was spent in patient counseling.    Parts of this note were created with voice recognition software. Please excuse any grammatical or typographical errors.

## 2017-01-30 ENCOUNTER — Encounter: Admit: 2017-01-30 | Discharge: 2017-01-30 | Payer: Private Health Insurance - Indemnity

## 2017-01-30 ENCOUNTER — Ambulatory Visit: Admit: 2017-01-30 | Discharge: 2017-01-31 | Payer: Private Health Insurance - Indemnity

## 2017-01-30 DIAGNOSIS — Z9189 Other specified personal risk factors, not elsewhere classified: Principal | ICD-10-CM

## 2017-01-30 DIAGNOSIS — D72819 Decreased white blood cell count, unspecified: ICD-10-CM

## 2017-01-30 DIAGNOSIS — C50919 Malignant neoplasm of unspecified site of unspecified female breast: ICD-10-CM

## 2017-01-30 DIAGNOSIS — R931 Abnormal findings on diagnostic imaging of heart and coronary circulation: ICD-10-CM

## 2017-01-30 DIAGNOSIS — Z0389 Encounter for observation for other suspected diseases and conditions ruled out: ICD-10-CM

## 2017-01-30 DIAGNOSIS — E785 Hyperlipidemia, unspecified: Principal | ICD-10-CM

## 2017-01-30 DIAGNOSIS — E781 Pure hyperglyceridemia: Principal | ICD-10-CM

## 2017-01-30 DIAGNOSIS — I519 Heart disease, unspecified: ICD-10-CM

## 2017-01-30 DIAGNOSIS — I2510 Atherosclerotic heart disease of native coronary artery without angina pectoris: ICD-10-CM

## 2017-01-30 DIAGNOSIS — N904 Leukoplakia of vulva: ICD-10-CM

## 2017-01-30 DIAGNOSIS — D693 Immune thrombocytopenic purpura: ICD-10-CM

## 2017-01-30 DIAGNOSIS — I10 Essential (primary) hypertension: ICD-10-CM

## 2017-01-30 MED ORDER — ATORVASTATIN 20 MG PO TAB
20 mg | ORAL_TABLET | Freq: Every day | ORAL | 3 refills | Status: AC
Start: 2017-01-30 — End: 2018-01-17

## 2017-01-30 NOTE — Progress Notes
LYMPHEDEMA DATASHEET-BASELINE    DATE:  01/30/2017    PATIENT NAME:   Teresa Trevino  DATE OF BIRTH:   [birth year]  MRN:   [id number]    Enrollment Visit Type:  Pre-Operative  Patient Completed Questionnaire?:  No    BASELINE DIAGNOSTIC ASSESSMENT    Diagnosis performed at [facility]?  Yes  Diagnostic Procedure:  Core Biopsy:  Ultrasound guided       Surgery performed at [facility]?   Yes  Surgical Procedure:  Other LN Dissection, Seed Localization, Segmental Mastectomy and SLN Mapping:     Plan:  Neo Adjuvant and Radiation    BASELINE MEASUREMENTS      Handedness:  right handed    Circumferential Measurements  Bioimpedance Analysis   RUE/LUE  Unilateral:    Hand:  19.0/18.0   1.3 baseline    Wrist:  15.0/14.1       8 cm:  19.5/18.5     16 cm:  22.0/21.0     Elbow cm:  24.5/24.0     8 cm:  25.5/24.5     16 cm:  28.0/27.0     24 cm:  29.5/29.0     BMI 28.8  Notes:                 Medical Team:   Surgeon: Loreta Ave  Plastic surgeon:   Medical oncologist: Freida Busman  Radiation oncologist:     Huntley Dec and her daughter, who is an LPN waiting to take her boards for RN are here today prior to starting neoadjuvant chemotherapy for her triple negative right breast cancer for pre-operative lymphedema education/evaluation.     Risk factors for the development of breast cancer treatment related lymphedema include:   1. Right  lumpectomy   2. Right SLNBX/pALND   3. Radiation Therapy  4. Chemotherapy - TC x 6 to start 02/09/17     Lymphedema evaluation, teaching and care plan:     1. General review of Lymphedema pathophysiology in breast cancer patients. Patient reports no previous history or experience/personal knowledge of lymphedema. No barriers to learning noted, patient willing and active participant.  Very interested in learning about topic, prevention.  Thoughtful questions demonstrated good insight into etiology and prevention.      2. Baseline assessment and measurements completed including:  Bilateral arm circumference utilizing a Juzo tape measure and L-Dex Bioimpedence test.   Strength, ROM and functional status assessed. All Assessments WNL.     3. Signs and Symptoms to watch for once initial symptoms from surgery have resolved reviewed including but not limited to:  achiness, tingling, discomfort, or increased warmth in the hand, arm, chest, breast, or underarm areas. Feelings of fullness or heaviness in the hand, arm, chest, breast, or underarm. Tightness or decreased flexibility in nearby joints, such as the shoulder, hand, or wrist. Trouble fitting the arm into a jacket or shirt sleeve that fit well before. Difficulty getting watches, rings, or bracelets on and off    4. Risk Factors:  BMI, Obesity and weight gain.  Optimal weight management is ideal for overall health. Discussed current BMI.   Nutritional resources available if needed.  Exercise when approved by surgeon maybe resumed.  Exercise is beneficial as well as the benefit of upper body strengthening.  Precautions regarding meticulous skin care reviewed.  This includes but not limited to sunscreen, burns, gardening, manicures, cuts, scrapes, signs of infection.  Avoidance of affect arm  needlestick's, blood pressures, finger sticks and other applicable prevention discussion completed.  5. When to call the office:  Sudden swelling or swelling that appears very quickly. Hand, arm, or other body parts seem to suddenly ???blow up??? may necessitate an evaluation.     6. RTC : Routine follow up with BIS surveillance. BIS to be coordinated with routine surgical follow ups. Written handouts regarding covered material provided at time of today's visit. Information regarding local support chapters including [place] also provided.     Total time 40 minutes. Estimated counseling time 35 minutes regarding signs symptoms, risk factors, monitoring, when to contact us and follow up plan.  Patient agreeable to follow up plan. Provided patient with contact information for follow up should questions or concerns arise.

## 2017-01-31 ENCOUNTER — Encounter: Admit: 2017-01-31 | Discharge: 2017-01-31 | Payer: Private Health Insurance - Indemnity

## 2017-02-01 NOTE — Telephone Encounter
Call returned to [place]. She was inquiring about getting a flu shot prior to starting treatment.   I have reviewed that with the plan to start chemo as well as get a port in the next few days we will hold off and wait until a later date. It will take about 14 days for her to build an immune response once she gets the injection and I do not want to risk her becoming ill prior to starting chemo.  She has verbalized an understanding and will call back with further questions.

## 2017-02-03 ENCOUNTER — Encounter: Admit: 2017-02-03 | Discharge: 2017-02-03 | Payer: Private Health Insurance - Indemnity

## 2017-02-03 DIAGNOSIS — Z171 Estrogen receptor negative status [ER-]: ICD-10-CM

## 2017-02-03 DIAGNOSIS — C50919 Malignant neoplasm of unspecified site of unspecified female breast: ICD-10-CM

## 2017-02-03 DIAGNOSIS — I10 Essential (primary) hypertension: ICD-10-CM

## 2017-02-03 DIAGNOSIS — Z87891 Personal history of nicotine dependence: ICD-10-CM

## 2017-02-03 DIAGNOSIS — C50311 Malignant neoplasm of lower-inner quadrant of right female breast: Principal | ICD-10-CM

## 2017-02-03 DIAGNOSIS — M545 Low back pain: ICD-10-CM

## 2017-02-03 DIAGNOSIS — D72819 Decreased white blood cell count, unspecified: ICD-10-CM

## 2017-02-03 DIAGNOSIS — I519 Heart disease, unspecified: ICD-10-CM

## 2017-02-03 DIAGNOSIS — N904 Leukoplakia of vulva: ICD-10-CM

## 2017-02-03 DIAGNOSIS — Z0389 Encounter for observation for other suspected diseases and conditions ruled out: ICD-10-CM

## 2017-02-03 DIAGNOSIS — D693 Immune thrombocytopenic purpura: ICD-10-CM

## 2017-02-03 DIAGNOSIS — E785 Hyperlipidemia, unspecified: Principal | ICD-10-CM

## 2017-02-03 LAB — CBC AND DIFF
Lab: 12 % (ref 11–15)
Lab: 13 g/dL (ref 12.0–15.0)
Lab: 32 pg (ref 26–34)
Lab: 34 g/dL (ref 32.0–36.0)
Lab: 39 % (ref 36–45)
Lab: 4.2 M/UL — ABNORMAL HIGH (ref >60)
Lab: 4.3 K/UL — ABNORMAL LOW (ref >60)
Lab: 7.6 FL (ref 7–11)

## 2017-02-03 LAB — COMPREHENSIVE METABOLIC PANEL
Lab: 137 MMOL/L — ABNORMAL HIGH (ref 137–147)
Lab: 4.2 MMOL/L (ref 3.5–5.1)

## 2017-02-03 MED ORDER — PROCHLORPERAZINE MALEATE 10 MG PO TAB
10 mg | ORAL_TABLET | ORAL | 3 refills | Status: AC | PRN
Start: 2017-02-03 — End: 2017-06-22

## 2017-02-03 MED ORDER — DEXAMETHASONE 4 MG PO TAB
ORAL_TABLET | INTRAMUSCULAR | 0 refills | 14.00000 days | Status: AC
Start: 2017-02-03 — End: 2017-06-22

## 2017-02-03 NOTE — Progress Notes
Name: Teresa Trevino          MRN: [id number]      DOB: [birth year]      AGE: 61 y.o.   DATE OF SERVICE: 02/03/2017    Subjective:             Reason for Visit:  Heme/Onc Care      Teresa Trevino is a 61 y.o. female.     Cancer Staging  Malignant neoplasm of lower-inner quadrant of right breast of female, estrogen receptor negative (HCC)  Staging form: Breast, AJCC 8th Edition  - Clinical stage from 01/19/2017: Stage IB (cT1b, cN0, cM0, G3, ER: Negative, PR: Negative, HER2: Negative) - Signed by Dimas Alexandria, PA-C on 01/24/2017      History of Present Illness  Teresa Trevino is a 61 y.o. female patient of Dr. Freida Busman with breast cancer. She presents for education on taxotere/carboplatin and neulasta. Her treatment is scheduled for 02/09/17. She will have her port placed on 02/06/17 by general surgeon in [place]. She would like to do her neulasta injections at home. Her husband is a Education officer, community and feels he can give her the shots. She is mildly anxious which is to be expected. She is accompanied by her husband today.       Oncology history:  1.  History of chronic intermittent leukopenia and thrombocytopenia, clinically insignificant.  May have autoimmune component and appeared to be her baseline.  However, labs in 01/2017 were unremarkable.  2.  November 2018: Noted on screening mammogram to have 1.2 cm abnormality in the right breast.  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  3.  Current plan: Neoadjuvant carboplatin/docetaxel to be followed by surgery/radiation  ???  Interval Events  Teresa Trevino returns to the clinic today with her husband and daughter for further discussion on her recently found breast cancer.  I had previously followed her for mild cytopenias which have resolved on her most recent labs.  I did again review and update her past medical, surgical, family, and social histories.  I also reviewed both PCP and surgical oncology notes, mammograms, ultrasound, labs, pathology for the visit today.  This small lesion in her right breast was found on routine screening mammography.  She has not had any recurrent headaches, vision changes, shortness of breath, chest pain, abdominal pain.  She chronically has had some low back pain on her left for several years.  She saw Dr. Loreta Ave yesterday and is planning to undergo lumpectomy in the future.  She is interested in neoadjuvant chemotherapy.  ???  02/06/17: Port to be placed.   02/09/17: Plan to start taxotere/carboplatin+neulasta.       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  No exercise induced chest pain.  2.  No ECG evidence of ischemia.  3.  No echocardiographic evidence of ischemia.  4.  Normal  Hemodynamic response to exercise.  5.  No significant exercise induced arrhythmias.  6.  Low risk scan.  7.  Consider medical management if indicated.).  Past surgical history:  has a past surgical history that includes Bladder surgery (2013) (bladder sling); hysterectomy (2008); colonoscopy; tonsillectomy; and Foot surgery.  Family history:  family history includes Cancer in her maternal grandmother;  Diabetes in her father, maternal grandfather, mother, and paternal uncle; Heart Failure in her father; High Cholesterol in her mother; Hypertension in her mother; Kidney Failure in her mother; None Reported in her sister; Stroke in her mother; Thyroid Disease in her mother.  Social history:  reports that she has quit smoking. Her smoking use included cigarettes. she has never used smokeless tobacco. She reports that she drinks about 1.0 oz of alcohol per week. She reports that she does not use drugs.       Review of Systems   Constitutional: Negative.    HENT: Negative.    Eyes: Negative.    Respiratory: Negative.    Cardiovascular: Negative.    Gastrointestinal: Negative.    Endocrine: Negative.    Genitourinary: Negative.    Musculoskeletal: Negative.    Allergic/Immunologic: Negative.    Neurological: Negative.    Hematological: Negative.    Psychiatric/Behavioral: Negative for agitation and behavioral problems. The patient is nervous/anxious.          Objective:         ??? ALPRAZolam (XANAX) 0.25 mg tablet Take 0.25 mg by mouth at bedtime as needed.   ??? atorvastatin (LIPITOR) 20 mg tablet Take one tablet by mouth daily.   ??? ciprofloxacin (CIPRO) 500 mg tablet Take 500 mg by mouth twice daily.   ??? clobetasol (TEMOVATE) 0.05 % topical ointment Apply to vulva for flares twice daily for 4 weeks then taper as directed   ??? dexamethasone (DECADRON) 4 mg tablet Take 12 mg po 12 hr before, morning of, and 12 hr after chemo   ??? ergocalciferol (VITAMIN D-2) 50,000 unit capsule Take 1 Cap by mouth every 7 days.   ??? lidocaine/prilocaine (EMLA) 2.5/2.5 % topical cream Apply to port site as needed before chemotherapy   ??? losartan (COZAAR) 25 mg tablet Take 1 tablet by mouth daily.   ??? OLANZapine (ZYPREXA) 10 mg tablet Take 1 tab each evening on days 1-4 of each cycle   ??? omeprazole DR(+) (PRILOSEC) 20 mg capsule Take 20 mg by mouth daily before breakfast.   ??? ondansetron (ZOFRAN) 4 mg tablet Take one tablet by mouth every 6 hours as needed for Nausea or Vomiting.   ??? prochlorperazine maleate (COMPAZINE) 10 mg tablet Take one tablet by mouth every 6 hours as needed for Nausea or Vomiting.     Vitals:    02/03/17 0800 02/03/17 0801   BP: 128/82    Pulse: 80    Resp: 18    Temp: 36.7 ???C (98 ???F)    TempSrc: Oral Oral   SpO2: 98%    Weight: 84.9 kg (187 lb 3.2 oz)    Height: 172.7 cm (68)      Body mass index is 28.46 kg/m???. Pain Score: Zero       Hospital Outpatient Visit on 02/03/2017   Component Date Value Ref Range Status   ??? White Blood Cells 02/03/2017 4.3* 4.5 - 11.0 K/UL Final   ??? RBC 02/03/2017 4.26  4.0 - 5.0 M/UL Final   ??? Hemoglobin 02/03/2017 13.7  12.0 - 15.0 GM/DL Final   ??? Hematocrit 02/03/2017 39.6  36 - 45 % Final   ??? MCV 02/03/2017 93.1  80 - 100 FL Final   ??? MCH 02/03/2017 32.2  26 - 34 PG Final   ??? MCHC 02/03/2017 34.6  32.0 - 36.0 G/DL Final   ??? RDW 16/11/9602 12.1  11 - 15 % Final   ??? Platelet Count 02/03/2017 144*  150 - 400 K/UL Final   ??? MPV 02/03/2017 7.6  7 - 11 FL Final   ??? Neutrophils 02/03/2017 60  41 - 77 % Final   ??? Lymphocytes 02/03/2017 29  24 - 44 % Final   ??? Monocytes 02/03/2017 9  4 - 12 % Final   ??? Eosinophils 02/03/2017 1  0 - 5 % Final   ??? Basophils 02/03/2017 1  0 - 2 % Final   ??? Absolute Neutrophil Count 02/03/2017 2.60  1.8 - 7.0 K/UL Final   ??? Absolute Lymph Count 02/03/2017 1.20  1.0 - 4.8 K/UL Final   ??? Absolute Monocyte Count 02/03/2017 0.40  0 - 0.80 K/UL Final   ??? Absolute Eosinophil Count 02/03/2017 0.00  0 - 0.45 K/UL Final   ??? Absolute Basophil Count 02/03/2017 0.00  0 - 0.20 K/UL Final       Pain Addressed:  N/A    Patient Evaluated for a Clinical Trial: No treatment clinical trial available for this patient.     [place] Cooperative Oncology Group performance status is 0, Fully active, able to carry on all pre-disease performance without restriction.Marland Kitchen     Physical Exam   Constitutional: She is oriented to person, place, and time. She appears well-developed and well-nourished. No distress.   HENT:   Head: Normocephalic and atraumatic.   Eyes: EOM are normal.   Neck: Normal range of motion.   Cardiovascular: Normal rate.   Pulmonary/Chest: Effort normal. No respiratory distress.   Abdominal: She exhibits no distension.   Musculoskeletal: Normal range of motion.   Lymphadenopathy:     She has no cervical adenopathy.        Right: No inguinal adenopathy present. Left: No inguinal adenopathy present.   Neurological: She is alert and oriented to person, place, and time.   Skin: Skin is warm and dry.   Psychiatric: She has a normal mood and affect. Her behavior is normal.   Vitals reviewed.            Assessment and Plan:    ICD-9-CM ICD-10-CM    1. Malignant neoplasm of lower-inner quadrant of right breast of female, estrogen receptor negative (HCC) 174.3 C50.311 dexamethasone (DECADRON) 4 mg tablet    V86.1 Z17.1 CBC AND DIFF      COMPREHENSIVE METABOLIC PANEL          Teresa Trevino is a 61 year old female with the following medical problems:  ???  1.  T1b N0 M0 stage IB triple negative cancer of the RIGHT breast  2.  Prior mild leukopenia, thrombocytopenia-- Normal on recent labs.  3. UTI  4. History of lichen sclerosus  ???  Current plan:  1.  Curative neoadjuvant IV carboplatin/docetaxel every 3 weeks x 6 cycles.  2.  Referral to Dr. Arlana Pouch, general surgeon in [place], for a port placement.  3.  NP teach   4.  Repeat CBC, CMP on day of NP teach.  5.  Prescription sent to her local pharmacy for dexamethasone, olanzapine, Zofran, EMLA cream.  6.  If possible, she would like to receive Neulasta at home with her daughter, who is a Engineer, civil (consulting). Will attempt insurance authorization.  7.  In the future, she plans to undergo lumpectomy/radiation after chemo is completed.  8. UTI- currently on cipro. Plans to start probiotic as she has diarrhea with abx.   9. History of lichen sclerosus- uses topical clobetasol when she has a flare. She has not had a flare in 4 months.  She follows with Dr. Garlan Fair ([place]). If she has issues we can evaluate in office or have her return to Dr. Garlan Fair.   10. Return to see Dr. Freida Busman for 1 week toxicity check.       Education Encounter  IV Chemotherapy: The following is a summary of the patient's IV Chemotherapy Education.    A thorough pre-assessment and teaching session explaining the mechanism of action, possible side effects, precautions and instructions regarding taxotere, Ledell Noss, neulasta for curative intent was conducted. The patient will return on 02/09/17 to initiate treatment. The cycle will repeat every 21 days for a total of 6  cycles.  Plan of administration was reviewed.    Both written and verbal information were given to the patient.    The planned course of treatment, anticipated benefits, material risks and potential side effects that may occur with this course of treatment were explained to the patient.  Side effects and their management were discussed in detail and include, but are not limited to: fatigue, hair loss, low blood counts, nausea, vomiting, loss of appetite, diarrhea, constipation, neuropathy, rash.      Prescriptions for supportive medications including compazine and dexamethasone were e-scripted to their pharmacy and discussed in detail how to take. Drug-drug interactions were reviewed as applicable.     The patient has received contact information for the clinic and was instructed on when and who to call.     The patient verbalized understanding, was given the opportunity to ask questions, and the consent form was signed.     Follow up appointment with physician in 1 week after treatment.    Lab 1 week after treatment.    This was a 60 minute face to face encounter with 60 minutes spent in counseling and coordination of care.                The above assessment and plan was reviewed with the patient and she verbalizes understanding and questions were answered to their satisfaction. she has contact information for the clinic and knows to call with any worsening symptoms or any questions or concerns.

## 2017-02-06 ENCOUNTER — Encounter: Admit: 2017-02-06 | Discharge: 2017-02-06 | Payer: Private Health Insurance - Indemnity

## 2017-02-06 NOTE — Telephone Encounter
Teresa Trevino has been called back with the directions on her dexamethasone. Clarification has been provided as there were 3 different instructions she had gotten on how to take the dexamethasone.  Also let Victor know that I have reached out to [employer] and due to the [place] the office will be closed until after Christmas.  She has verbalized and understanding of both items and will  call if she has further questions.

## 2017-02-09 ENCOUNTER — Encounter: Admit: 2017-02-09 | Discharge: 2017-02-09 | Payer: Private Health Insurance - Indemnity

## 2017-02-09 ENCOUNTER — Encounter: Admit: 2017-02-09 | Discharge: 2017-02-10 | Payer: Private Health Insurance - Indemnity

## 2017-02-09 DIAGNOSIS — Z5111 Encounter for antineoplastic chemotherapy: ICD-10-CM

## 2017-02-09 DIAGNOSIS — Z171 Estrogen receptor negative status [ER-]: ICD-10-CM

## 2017-02-09 DIAGNOSIS — C50311 Malignant neoplasm of lower-inner quadrant of right female breast: Principal | ICD-10-CM

## 2017-02-09 LAB — CBC AND DIFF
Lab: 0 % (ref 0–2)
Lab: 0 10*3/uL (ref 0–0.20)
Lab: 0 10*3/uL (ref 0–0.45)
Lab: 12 % (ref 11–15)
Lab: 34 g/dL — ABNORMAL HIGH (ref 32.0–36.0)
Lab: 4.1 M/UL — ABNORMAL LOW (ref 4.0–5.0)
Lab: 9.7 10*3/uL — ABNORMAL LOW (ref 4.5–11.0)

## 2017-02-09 LAB — COMPREHENSIVE METABOLIC PANEL
Lab: 0.4 mg/dL (ref 0.3–1.2)
Lab: 100 MMOL/L — ABNORMAL HIGH (ref 98–110)
Lab: 13 mg/dL — ABNORMAL HIGH (ref 7–25)
Lab: 16 U/L — ABNORMAL LOW (ref >60)
Lab: 3.9 MMOL/L — ABNORMAL HIGH (ref 3.5–5.1)
Lab: 37 U/L — ABNORMAL LOW (ref >60)
Lab: 4.6 g/dL — ABNORMAL HIGH (ref 3.5–5.0)
Lab: 7 K/UL — ABNORMAL HIGH (ref 3–12)
Lab: 7.2 g/dL (ref 6.0–8.0)
Lab: >60 mL/min (ref >60)
Lab: >60 mL/min — ABNORMAL LOW (ref >60)

## 2017-02-09 LAB — POC CREATININE, RAD: Lab: 0.5 mg/dL (ref 0.4–1.00)

## 2017-02-09 MED ORDER — DEXAMETHASONE IVPB
12 mg | Freq: Once | INTRAVENOUS | 0 refills | Status: CP
Start: 2017-02-09 — End: ?
  Administered 2017-02-09 (×2): 12 mg via INTRAVENOUS

## 2017-02-09 MED ORDER — PALONOSETRON 0.25 MG/5 ML IV SOLN
.25 mg | Freq: Once | INTRAVENOUS | 0 refills | Status: CP
Start: 2017-02-09 — End: ?
  Administered 2017-02-09: 17:00:00 0.25 mg via INTRAVENOUS

## 2017-02-09 MED ORDER — CARBOPLATIN IVPB (BY AUC-SWOG)
752.4 mg | Freq: Once | INTRAVENOUS | 0 refills | Status: CP
Start: 2017-02-09 — End: ?
  Administered 2017-02-09: 19:00:00 752.4 mg via INTRAVENOUS

## 2017-02-09 MED ORDER — APREPITANT 7.2 MG/ML IV EMUL
130 mg | Freq: Once | INTRAVENOUS | 0 refills | Status: CP
Start: 2017-02-09 — End: ?
  Administered 2017-02-09: 17:00:00 130 mg via INTRAVENOUS

## 2017-02-09 MED ORDER — DOCETAXEL IVPB
75 mg/m2 | Freq: Once | INTRAVENOUS | 0 refills | Status: CP
Start: 2017-02-09 — End: ?
  Administered 2017-02-09 (×2): 154.5 mg via INTRAVENOUS

## 2017-02-09 MED ORDER — HEPARIN, PORCINE (PF) 100 UNIT/ML IV SYRG
500 [IU] | Freq: Once | 0 refills | Status: CP
Start: 2017-02-09 — End: ?

## 2017-02-09 NOTE — Progress Notes
CHEMO NOTE  Verified chemo consent signed and in chart.    Verified initiate chemo order in O2    Blood return positive via: Port (Single)    BSA and dose double checked (agree with orders as written) with: yes T. Clydene Fake, RN and Clint Guy, RN     Labs/applicable tests checked: CBC and Comprehensive Metabolic Panel (CMP)    Chemo regime: Drug/cycle/day C1 D1 Taxotere and Carbo     Rate verified and armband double checkwith second RN: yes    Patient education offered and stated understanding. Denies questions at this time.

## 2017-02-10 ENCOUNTER — Encounter: Admit: 2017-02-10 | Discharge: 2017-02-10 | Payer: Private Health Insurance - Indemnity

## 2017-02-10 DIAGNOSIS — Z171 Estrogen receptor negative status [ER-]: ICD-10-CM

## 2017-02-10 DIAGNOSIS — Z5111 Encounter for antineoplastic chemotherapy: ICD-10-CM

## 2017-02-10 DIAGNOSIS — C50311 Malignant neoplasm of lower-inner quadrant of right female breast: Principal | ICD-10-CM

## 2017-02-10 MED ORDER — PEGFILGRASTIM 6 MG/0.6ML SC SYRG
6 mg | Freq: Once | SUBCUTANEOUS | 0 refills | Status: CP
Start: 2017-02-10 — End: ?
  Administered 2017-02-10: 15:00:00 6 mg via SUBCUTANEOUS

## 2017-02-10 NOTE — Progress Notes
Pt presents today for neulasta injection. Pt complains of diarrhea, encouraged to start imodium. Pt denies N/V or other symptoms. Pt tolerated injection without difficulty. Pt released in stable condition.

## 2017-02-10 NOTE — Telephone Encounter
Teresa Trevino was contacted in regard to cycle 1, day 1 chemotherapy.     Patient was asked:    1. Are you experiencing any symptoms or side effects (nausea/vomiting/diarrhea/joint pain)? diarrhea    2. Are you utilizing your supportive medications? imodium    3. Are you able to eat and drink? yes - it seems as though the diarrhea happens very soon after she eats or drinks anything. She will continue to eat and hydrate. The loose BM have slowed with the use of the imodium      4. Were there any prescriptions you were unable to pick up? no    5. Do you have any questions I can answer for you? only the question about diarrhea. this has been reviewed and let [name] know that if she needs to take additional imodium she can but not to exceed 4 tabs daily.    6. Are you scheduled for your next treatment and/or office visit? Tox check 02-16-17    Teresa Trevino verbalized understanding and denied further need at this time.

## 2017-02-16 ENCOUNTER — Encounter: Admit: 2017-02-16 | Discharge: 2017-02-17 | Payer: Private Health Insurance - Indemnity

## 2017-02-16 ENCOUNTER — Encounter: Admit: 2017-02-16 | Discharge: 2017-02-16 | Payer: Private Health Insurance - Indemnity

## 2017-02-16 DIAGNOSIS — R197 Diarrhea, unspecified: ICD-10-CM

## 2017-02-16 DIAGNOSIS — Z171 Estrogen receptor negative status [ER-]: ICD-10-CM

## 2017-02-16 DIAGNOSIS — R5383 Other fatigue: ICD-10-CM

## 2017-02-16 DIAGNOSIS — I519 Heart disease, unspecified: ICD-10-CM

## 2017-02-16 DIAGNOSIS — C50311 Malignant neoplasm of lower-inner quadrant of right female breast: Principal | ICD-10-CM

## 2017-02-16 DIAGNOSIS — D72819 Decreased white blood cell count, unspecified: ICD-10-CM

## 2017-02-16 DIAGNOSIS — D6959 Other secondary thrombocytopenia: ICD-10-CM

## 2017-02-16 DIAGNOSIS — T732XXA Exhaustion due to exposure, initial encounter: ICD-10-CM

## 2017-02-16 DIAGNOSIS — R110 Nausea: ICD-10-CM

## 2017-02-16 DIAGNOSIS — Z0389 Encounter for observation for other suspected diseases and conditions ruled out: ICD-10-CM

## 2017-02-16 DIAGNOSIS — I10 Essential (primary) hypertension: ICD-10-CM

## 2017-02-16 DIAGNOSIS — D693 Immune thrombocytopenic purpura: ICD-10-CM

## 2017-02-16 DIAGNOSIS — E785 Hyperlipidemia, unspecified: Principal | ICD-10-CM

## 2017-02-16 DIAGNOSIS — C50919 Malignant neoplasm of unspecified site of unspecified female breast: ICD-10-CM

## 2017-02-16 DIAGNOSIS — N904 Leukoplakia of vulva: ICD-10-CM

## 2017-02-16 LAB — CBC AND DIFF
Lab: 12 % (ref 11–15)
Lab: 13 g/dL (ref 12.0–15.0)
Lab: 32 pg (ref 26–34)
Lab: 35 g/dL (ref 32.0–36.0)
Lab: 4 M/UL (ref 4.0–5.0)
Lab: 5.4 10*3/uL — ABNORMAL LOW (ref 4.5–11.0)
Lab: 92 FL (ref 80–100)

## 2017-02-16 MED ORDER — SODIUM CHLORIDE 0.9 % IV SOLP
1000 mL | Freq: Once | INTRAVENOUS | 0 refills | Status: CP
Start: 2017-02-16 — End: ?
  Administered 2017-02-16: 20:00:00 1000 mL via INTRAVENOUS

## 2017-02-16 MED ORDER — HEPARIN, PORCINE (PF) 100 UNIT/ML IV SYRG
500 [IU] | Freq: Once | 0 refills | Status: CP
Start: 2017-02-16 — End: ?

## 2017-02-16 MED ORDER — SODIUM CHLORIDE 0.9 % IV SOLP
1000 mL | Freq: Once | INTRAVENOUS | 0 refills | Status: CN
Start: 2017-02-16 — End: ?

## 2017-02-17 ENCOUNTER — Encounter: Admit: 2017-02-17 | Discharge: 2017-02-17 | Payer: Private Health Insurance - Indemnity

## 2017-02-17 DIAGNOSIS — Z171 Estrogen receptor negative status [ER-]: ICD-10-CM

## 2017-02-17 DIAGNOSIS — K521 Toxic gastroenteritis and colitis: Principal | ICD-10-CM

## 2017-02-17 DIAGNOSIS — C50311 Malignant neoplasm of lower-inner quadrant of right female breast: ICD-10-CM

## 2017-02-17 DIAGNOSIS — T451X5A Adverse effect of antineoplastic and immunosuppressive drugs, initial encounter: ICD-10-CM

## 2017-02-17 LAB — COMPREHENSIVE METABOLIC PANEL
Lab: 123 MMOL/L — ABNORMAL LOW (ref 137–147)
Lab: 4 MMOL/L (ref 3.5–5.1)

## 2017-02-17 MED ORDER — HEPARIN, PORCINE (PF) 100 UNIT/ML IV SYRG
500 [IU] | Freq: Once | 0 refills | Status: CP
Start: 2017-02-17 — End: ?

## 2017-02-17 MED ORDER — SODIUM CHLORIDE 0.9 % IV SOLP
1000 mL | Freq: Once | INTRAVENOUS | 0 refills | Status: CP
Start: 2017-02-17 — End: ?
  Administered 2017-02-17: 19:00:00 1000 mL via INTRAVENOUS

## 2017-02-22 ENCOUNTER — Encounter: Admit: 2017-02-22 | Discharge: 2017-02-22 | Payer: Private Health Insurance - Indemnity

## 2017-02-26 ENCOUNTER — Encounter: Admit: 2017-02-26 | Discharge: 2017-02-26 | Payer: Private Health Insurance - Indemnity

## 2017-03-01 ENCOUNTER — Encounter: Admit: 2017-03-01 | Discharge: 2017-03-01 | Payer: Private Health Insurance - Indemnity

## 2017-03-02 ENCOUNTER — Encounter: Admit: 2017-03-02 | Discharge: 2017-03-02 | Payer: Private Health Insurance - Indemnity

## 2017-03-02 ENCOUNTER — Encounter: Admit: 2017-03-02 | Discharge: 2017-03-03 | Payer: Private Health Insurance - Indemnity

## 2017-03-02 DIAGNOSIS — T732XXA Exhaustion due to exposure, initial encounter: ICD-10-CM

## 2017-03-02 DIAGNOSIS — R197 Diarrhea, unspecified: ICD-10-CM

## 2017-03-02 DIAGNOSIS — Z171 Estrogen receptor negative status [ER-]: ICD-10-CM

## 2017-03-02 DIAGNOSIS — C50311 Malignant neoplasm of lower-inner quadrant of right female breast: Principal | ICD-10-CM

## 2017-03-02 DIAGNOSIS — D72819 Decreased white blood cell count, unspecified: ICD-10-CM

## 2017-03-02 DIAGNOSIS — D6959 Other secondary thrombocytopenia: ICD-10-CM

## 2017-03-02 DIAGNOSIS — R110 Nausea: ICD-10-CM

## 2017-03-02 DIAGNOSIS — L280 Lichen simplex chronicus: ICD-10-CM

## 2017-03-02 DIAGNOSIS — I10 Essential (primary) hypertension: ICD-10-CM

## 2017-03-02 DIAGNOSIS — R5383 Other fatigue: ICD-10-CM

## 2017-03-02 DIAGNOSIS — I519 Heart disease, unspecified: ICD-10-CM

## 2017-03-02 DIAGNOSIS — Z5111 Encounter for antineoplastic chemotherapy: ICD-10-CM

## 2017-03-02 DIAGNOSIS — N904 Leukoplakia of vulva: ICD-10-CM

## 2017-03-02 DIAGNOSIS — Z0389 Encounter for observation for other suspected diseases and conditions ruled out: ICD-10-CM

## 2017-03-02 DIAGNOSIS — D693 Immune thrombocytopenic purpura: ICD-10-CM

## 2017-03-02 DIAGNOSIS — E785 Hyperlipidemia, unspecified: Principal | ICD-10-CM

## 2017-03-02 DIAGNOSIS — C50919 Malignant neoplasm of unspecified site of unspecified female breast: ICD-10-CM

## 2017-03-02 LAB — CBC AND DIFF: Lab: 11 10*3/uL — ABNORMAL HIGH (ref 4.5–11.0)

## 2017-03-02 LAB — COMPREHENSIVE METABOLIC PANEL
Lab: 0.8 mg/dL (ref 0.4–1.00)
Lab: 101 MMOL/L — ABNORMAL LOW (ref 98–110)
Lab: 131 mg/dL — ABNORMAL HIGH (ref 70–100)
Lab: 135 MMOL/L — ABNORMAL LOW (ref 137–147)
Lab: 20 mg/dL (ref 7–25)
Lab: 4.5 MMOL/L — ABNORMAL LOW (ref 3.5–5.1)
Lab: 9.7 mg/dL (ref 8.5–10.6)

## 2017-03-02 LAB — POC CREATININE, RAD: Lab: 0.8 mg/dL (ref 0.4–1.00)

## 2017-03-02 MED ORDER — CARBOPLATIN IVPB (BY AUC-SWOG)
610.08 mg | Freq: Once | INTRAVENOUS | 0 refills | Status: CP
Start: 2017-03-02 — End: ?
  Administered 2017-03-02 (×2): 610.08 mg via INTRAVENOUS

## 2017-03-02 MED ORDER — DEXAMETHASONE IVPB
12 mg | Freq: Once | INTRAVENOUS | 0 refills | Status: CN
Start: 2017-03-02 — End: ?

## 2017-03-02 MED ORDER — SODIUM CHLORIDE 0.9 % IV SOLP
1000 mL | Freq: Once | INTRAVENOUS | 0 refills | Status: CN
Start: 2017-03-02 — End: ?

## 2017-03-02 MED ORDER — APREPITANT 7.2 MG/ML IV EMUL
130 mg | Freq: Once | INTRAVENOUS | 0 refills | Status: CP
Start: 2017-03-02 — End: ?
  Administered 2017-03-02: 16:00:00 130 mg via INTRAVENOUS

## 2017-03-02 MED ORDER — APREPITANT 7.2 MG/ML IV EMUL
130 mg | Freq: Once | INTRAVENOUS | 0 refills | Status: CN
Start: 2017-03-02 — End: ?

## 2017-03-02 MED ORDER — HEPARIN, PORCINE (PF) 100 UNIT/ML IV SYRG
500 [IU] | Freq: Once | 0 refills | Status: CP
Start: 2017-03-02 — End: ?

## 2017-03-02 MED ORDER — PALONOSETRON 0.25 MG/5 ML IV SOLN
.25 mg | Freq: Once | INTRAVENOUS | 0 refills | Status: CP
Start: 2017-03-02 — End: ?
  Administered 2017-03-02: 16:00:00 0.25 mg via INTRAVENOUS

## 2017-03-02 MED ORDER — CARBOPLATIN IVPB (BY AUC-SWOG)
610.08 mg | Freq: Once | INTRAVENOUS | 0 refills | Status: CN
Start: 2017-03-02 — End: ?

## 2017-03-02 MED ORDER — ALTEPLASE 2 MG IK SOLR
2 mg | Freq: Once | INTRAMUSCULAR | 0 refills | Status: CP
Start: 2017-03-02 — End: ?
  Administered 2017-03-02: 15:00:00 2 mg via INTRAMUSCULAR

## 2017-03-02 MED ORDER — DOCETAXEL IVPB
60 mg/m2 | Freq: Once | INTRAVENOUS | 0 refills | Status: CN
Start: 2017-03-02 — End: ?

## 2017-03-02 MED ORDER — DEXAMETHASONE IVPB
12 mg | Freq: Once | INTRAVENOUS | 0 refills | Status: CP
Start: 2017-03-02 — End: ?
  Administered 2017-03-02 (×2): 12 mg via INTRAVENOUS

## 2017-03-02 MED ORDER — PALONOSETRON 0.25 MG/5 ML IV SOLN
.25 mg | Freq: Once | INTRAVENOUS | 0 refills | Status: CN
Start: 2017-03-02 — End: ?

## 2017-03-02 MED ORDER — DOCETAXEL IVPB
60 mg/m2 | Freq: Once | INTRAVENOUS | 0 refills | Status: CP
Start: 2017-03-02 — End: ?
  Administered 2017-03-02 (×2): 123.6 mg via INTRAVENOUS

## 2017-03-02 MED ORDER — PEGFILGRASTIM 6 MG/0.6ML SC SYRG
6 mg | Freq: Once | SUBCUTANEOUS | 0 refills | Status: CN
Start: 2017-03-02 — End: ?

## 2017-03-03 ENCOUNTER — Encounter: Admit: 2017-03-03 | Discharge: 2017-03-03 | Payer: Private Health Insurance - Indemnity

## 2017-03-03 DIAGNOSIS — C50311 Malignant neoplasm of lower-inner quadrant of right female breast: ICD-10-CM

## 2017-03-03 DIAGNOSIS — Z5111 Encounter for antineoplastic chemotherapy: Principal | ICD-10-CM

## 2017-03-03 DIAGNOSIS — Z171 Estrogen receptor negative status [ER-]: ICD-10-CM

## 2017-03-03 MED ORDER — PEGFILGRASTIM 6 MG/0.6ML SC SYRG
6 mg | Freq: Once | SUBCUTANEOUS | 0 refills | Status: CP
Start: 2017-03-03 — End: ?
  Administered 2017-03-03: 15:00:00 6 mg via SUBCUTANEOUS

## 2017-03-07 ENCOUNTER — Encounter: Admit: 2017-03-07 | Discharge: 2017-03-07 | Payer: Private Health Insurance - Indemnity

## 2017-03-07 DIAGNOSIS — C50311 Malignant neoplasm of lower-inner quadrant of right female breast: Principal | ICD-10-CM

## 2017-03-07 DIAGNOSIS — Z171 Estrogen receptor negative status [ER-]: ICD-10-CM

## 2017-03-07 MED ORDER — HEPARIN, PORCINE (PF) 100 UNIT/ML IV SYRG
500 [IU] | Freq: Once | INTRAVENOUS | 0 refills | Status: CP
Start: 2017-03-07 — End: ?
  Administered 2017-03-07: 20:00:00 500 [IU] via INTRAVENOUS

## 2017-03-07 MED ORDER — SODIUM CHLORIDE 0.9 % IV SOLP
1000 mL | Freq: Once | INTRAVENOUS | 0 refills | Status: CP
Start: 2017-03-07 — End: ?

## 2017-03-09 ENCOUNTER — Encounter: Admit: 2017-03-09 | Discharge: 2017-03-09 | Payer: Private Health Insurance - Indemnity

## 2017-03-09 DIAGNOSIS — C50311 Malignant neoplasm of lower-inner quadrant of right female breast: Principal | ICD-10-CM

## 2017-03-09 DIAGNOSIS — Z171 Estrogen receptor negative status [ER-]: ICD-10-CM

## 2017-03-09 MED ORDER — SODIUM CHLORIDE 0.9 % IV SOLP
1000 mL | Freq: Once | INTRAVENOUS | 0 refills | Status: CP
Start: 2017-03-09 — End: ?

## 2017-03-09 MED ORDER — HEPARIN, PORCINE (PF) 100 UNIT/ML IV SYRG
500 [IU] | Freq: Once | 0 refills | Status: CP
Start: 2017-03-09 — End: ?

## 2017-03-14 ENCOUNTER — Encounter: Admit: 2017-03-14 | Discharge: 2017-03-14 | Payer: Private Health Insurance - Indemnity

## 2017-03-16 ENCOUNTER — Encounter: Admit: 2017-03-16 | Discharge: 2017-03-16 | Payer: Private Health Insurance - Indemnity

## 2017-03-16 ENCOUNTER — Ambulatory Visit: Admit: 2017-03-16 | Discharge: 2017-03-17 | Payer: Private Health Insurance - Indemnity

## 2017-03-16 DIAGNOSIS — I10 Essential (primary) hypertension: ICD-10-CM

## 2017-03-16 DIAGNOSIS — E785 Hyperlipidemia, unspecified: Principal | ICD-10-CM

## 2017-03-16 DIAGNOSIS — N904 Leukoplakia of vulva: ICD-10-CM

## 2017-03-16 DIAGNOSIS — C50919 Malignant neoplasm of unspecified site of unspecified female breast: ICD-10-CM

## 2017-03-16 DIAGNOSIS — D693 Immune thrombocytopenic purpura: ICD-10-CM

## 2017-03-16 DIAGNOSIS — Z0389 Encounter for observation for other suspected diseases and conditions ruled out: ICD-10-CM

## 2017-03-16 DIAGNOSIS — I519 Heart disease, unspecified: ICD-10-CM

## 2017-03-16 DIAGNOSIS — D72819 Decreased white blood cell count, unspecified: ICD-10-CM

## 2017-03-16 MED ORDER — CLOBETASOL 0.05 % TP OINT
Freq: Two times a day (BID) | OPHTHALMIC | 2 refills | 30.00000 days | Status: AC
Start: 2017-03-16 — End: 2017-10-25

## 2017-03-17 ENCOUNTER — Ambulatory Visit: Admit: 2017-03-17 | Discharge: 2017-03-31 | Payer: Private Health Insurance - Indemnity

## 2017-03-17 DIAGNOSIS — N904 Leukoplakia of vulva: Principal | ICD-10-CM

## 2017-03-17 DIAGNOSIS — Z171 Estrogen receptor negative status [ER-]: Secondary | ICD-10-CM

## 2017-03-20 ENCOUNTER — Encounter: Admit: 2017-03-20 | Discharge: 2017-03-20 | Payer: Private Health Insurance - Indemnity

## 2017-03-20 DIAGNOSIS — Z0389 Encounter for observation for other suspected diseases and conditions ruled out: ICD-10-CM

## 2017-03-20 DIAGNOSIS — N904 Leukoplakia of vulva: ICD-10-CM

## 2017-03-20 DIAGNOSIS — C50311 Malignant neoplasm of lower-inner quadrant of right female breast: Principal | ICD-10-CM

## 2017-03-20 DIAGNOSIS — C50919 Malignant neoplasm of unspecified site of unspecified female breast: ICD-10-CM

## 2017-03-20 DIAGNOSIS — Z171 Estrogen receptor negative status [ER-]: ICD-10-CM

## 2017-03-20 DIAGNOSIS — D693 Immune thrombocytopenic purpura: ICD-10-CM

## 2017-03-20 DIAGNOSIS — Z87891 Personal history of nicotine dependence: ICD-10-CM

## 2017-03-20 DIAGNOSIS — I10 Essential (primary) hypertension: ICD-10-CM

## 2017-03-20 DIAGNOSIS — D72819 Decreased white blood cell count, unspecified: ICD-10-CM

## 2017-03-20 DIAGNOSIS — I519 Heart disease, unspecified: ICD-10-CM

## 2017-03-20 DIAGNOSIS — E785 Hyperlipidemia, unspecified: Principal | ICD-10-CM

## 2017-03-20 MED ORDER — CEFAZOLIN INJ 1GM IVP
2 g | Freq: Once | INTRAVENOUS | 0 refills | Status: CN
Start: 2017-03-20 — End: ?

## 2017-03-22 ENCOUNTER — Encounter: Admit: 2017-03-22 | Discharge: 2017-03-22 | Payer: Private Health Insurance - Indemnity

## 2017-03-23 ENCOUNTER — Encounter: Admit: 2017-03-23 | Discharge: 2017-03-23 | Payer: Private Health Insurance - Indemnity

## 2017-03-23 ENCOUNTER — Encounter: Admit: 2017-03-23 | Discharge: 2017-03-24 | Payer: Private Health Insurance - Indemnity

## 2017-03-23 DIAGNOSIS — Z171 Estrogen receptor negative status [ER-]: ICD-10-CM

## 2017-03-23 DIAGNOSIS — T732XXA Exhaustion due to exposure, initial encounter: ICD-10-CM

## 2017-03-23 DIAGNOSIS — C50311 Malignant neoplasm of lower-inner quadrant of right female breast: Principal | ICD-10-CM

## 2017-03-23 DIAGNOSIS — I10 Essential (primary) hypertension: ICD-10-CM

## 2017-03-23 DIAGNOSIS — D693 Immune thrombocytopenic purpura: ICD-10-CM

## 2017-03-23 DIAGNOSIS — K521 Toxic gastroenteritis and colitis: ICD-10-CM

## 2017-03-23 DIAGNOSIS — Z0389 Encounter for observation for other suspected diseases and conditions ruled out: ICD-10-CM

## 2017-03-23 DIAGNOSIS — D72819 Decreased white blood cell count, unspecified: ICD-10-CM

## 2017-03-23 DIAGNOSIS — E785 Hyperlipidemia, unspecified: Principal | ICD-10-CM

## 2017-03-23 DIAGNOSIS — I519 Heart disease, unspecified: ICD-10-CM

## 2017-03-23 DIAGNOSIS — N904 Leukoplakia of vulva: ICD-10-CM

## 2017-03-23 DIAGNOSIS — C50919 Malignant neoplasm of unspecified site of unspecified female breast: ICD-10-CM

## 2017-03-23 LAB — CBC AND DIFF
Lab: 0 10*3/uL (ref 0–0.20)
Lab: 0 10*3/uL (ref 0–0.45)
Lab: 9.5 10*3/uL (ref 4.5–11.0)

## 2017-03-23 LAB — COMPREHENSIVE METABOLIC PANEL
Lab: 0.4 mg/dL (ref 0.3–1.2)
Lab: 0.8 mg/dL (ref 0.4–1.00)
Lab: 10 10*3/uL — ABNORMAL HIGH (ref 3–12)
Lab: 101 MMOL/L — ABNORMAL LOW (ref 98–110)
Lab: 135 MMOL/L — ABNORMAL LOW (ref 137–147)
Lab: 14 U/L — ABNORMAL LOW (ref 7–40)
Lab: 15 U/L (ref 7–56)
Lab: 16 mg/dL (ref 7–25)
Lab: 162 mg/dL — ABNORMAL HIGH (ref 70–100)
Lab: 24 MMOL/L (ref 21–30)
Lab: 4 MMOL/L — ABNORMAL LOW (ref 3.5–5.1)
Lab: 4.1 g/dL — ABNORMAL HIGH (ref 3.5–5.0)
Lab: 53 U/L — ABNORMAL LOW (ref 25–110)
Lab: 6.2 g/dL — ABNORMAL LOW (ref 6.0–8.0)
Lab: 9.3 mg/dL (ref 8.5–10.6)
Lab: >60 mL/min (ref >60)
Lab: >60 mL/min — ABNORMAL LOW (ref >60)

## 2017-03-23 MED ORDER — PALONOSETRON 0.25 MG/5 ML IV SOLN
.25 mg | Freq: Once | INTRAVENOUS | 0 refills | Status: CP
Start: 2017-03-23 — End: ?
  Administered 2017-03-23: 17:00:00 0.25 mg via INTRAVENOUS

## 2017-03-23 MED ORDER — PEGFILGRASTIM 6 MG/0.6ML SC SYRG
6 mg | Freq: Once | SUBCUTANEOUS | 0 refills | Status: CN
Start: 2017-03-23 — End: ?

## 2017-03-23 MED ORDER — DEXAMETHASONE IVPB
12 mg | Freq: Once | INTRAVENOUS | 0 refills | Status: CN
Start: 2017-03-23 — End: ?

## 2017-03-23 MED ORDER — APREPITANT 7.2 MG/ML IV EMUL
130 mg | Freq: Once | INTRAVENOUS | 0 refills | Status: CP
Start: 2017-03-23 — End: ?
  Administered 2017-03-23: 17:00:00 130 mg via INTRAVENOUS

## 2017-03-23 MED ORDER — DOCETAXEL IVPB
60 mg/m2 | Freq: Once | INTRAVENOUS | 0 refills | Status: CP
Start: 2017-03-23 — End: ?
  Administered 2017-03-23 (×2): 123.6 mg via INTRAVENOUS

## 2017-03-23 MED ORDER — CARBOPLATIN IVPB (BY AUC-SWOG)
633 mg | Freq: Once | INTRAVENOUS | 0 refills | Status: CN
Start: 2017-03-23 — End: ?

## 2017-03-23 MED ORDER — DEXAMETHASONE IVPB
12 mg | Freq: Once | INTRAVENOUS | 0 refills | Status: CP
Start: 2017-03-23 — End: ?
  Administered 2017-03-23 (×2): 12 mg via INTRAVENOUS

## 2017-03-23 MED ORDER — PALONOSETRON 0.25 MG/5 ML IV SOLN
.25 mg | Freq: Once | INTRAVENOUS | 0 refills | Status: CN
Start: 2017-03-23 — End: ?

## 2017-03-23 MED ORDER — CARBOPLATIN IVPB (BY AUC-SWOG)
633 mg | Freq: Once | INTRAVENOUS | 0 refills | Status: CP
Start: 2017-03-23 — End: ?
  Administered 2017-03-23: 19:00:00 633 mg via INTRAVENOUS

## 2017-03-23 MED ORDER — DOCETAXEL IVPB
60 mg/m2 | Freq: Once | INTRAVENOUS | 0 refills | Status: CN
Start: 2017-03-23 — End: ?

## 2017-03-23 MED ORDER — HEPARIN, PORCINE (PF) 100 UNIT/ML IV SYRG
500 [IU] | Freq: Once | 0 refills | Status: CP
Start: 2017-03-23 — End: ?

## 2017-03-23 MED ORDER — APREPITANT 7.2 MG/ML IV EMUL
130 mg | Freq: Once | INTRAVENOUS | 0 refills | Status: CN
Start: 2017-03-23 — End: ?

## 2017-03-24 ENCOUNTER — Encounter: Admit: 2017-03-24 | Discharge: 2017-03-25 | Payer: Private Health Insurance - Indemnity

## 2017-03-24 ENCOUNTER — Encounter: Admit: 2017-03-24 | Discharge: 2017-03-24 | Payer: Private Health Insurance - Indemnity

## 2017-03-24 ENCOUNTER — Ambulatory Visit: Admit: 2017-03-24 | Discharge: 2017-03-24 | Payer: Private Health Insurance - Indemnity

## 2017-03-24 DIAGNOSIS — D72819 Decreased white blood cell count, unspecified: ICD-10-CM

## 2017-03-24 DIAGNOSIS — I519 Heart disease, unspecified: ICD-10-CM

## 2017-03-24 DIAGNOSIS — N904 Leukoplakia of vulva: ICD-10-CM

## 2017-03-24 DIAGNOSIS — D693 Immune thrombocytopenic purpura: ICD-10-CM

## 2017-03-24 DIAGNOSIS — Z0389 Encounter for observation for other suspected diseases and conditions ruled out: ICD-10-CM

## 2017-03-24 DIAGNOSIS — I10 Essential (primary) hypertension: ICD-10-CM

## 2017-03-24 DIAGNOSIS — C50919 Malignant neoplasm of unspecified site of unspecified female breast: ICD-10-CM

## 2017-03-24 DIAGNOSIS — E785 Hyperlipidemia, unspecified: Principal | ICD-10-CM

## 2017-03-24 DIAGNOSIS — C50311 Malignant neoplasm of lower-inner quadrant of right female breast: Principal | ICD-10-CM

## 2017-03-24 MED ORDER — PEGFILGRASTIM 6 MG/0.6ML SC SYRG
6 mg | Freq: Once | SUBCUTANEOUS | 0 refills | Status: CP
Start: 2017-03-24 — End: ?
  Administered 2017-03-24: 16:00:00 6 mg via SUBCUTANEOUS

## 2017-03-28 ENCOUNTER — Encounter: Admit: 2017-03-28 | Discharge: 2017-03-28 | Payer: Private Health Insurance - Indemnity

## 2017-03-28 DIAGNOSIS — Z171 Estrogen receptor negative status [ER-]: ICD-10-CM

## 2017-03-28 DIAGNOSIS — R300 Dysuria: ICD-10-CM

## 2017-03-28 DIAGNOSIS — R350 Frequency of micturition: ICD-10-CM

## 2017-03-28 DIAGNOSIS — C50311 Malignant neoplasm of lower-inner quadrant of right female breast: Principal | ICD-10-CM

## 2017-03-28 LAB — URINALYSIS DIPSTICK
Lab: 1 (ref 1.003–1.035)
Lab: 6.5 (ref 5.0–8.0)
Lab: NEGATIVE
Lab: NEGATIVE
Lab: NEGATIVE
Lab: NEGATIVE
Lab: NEGATIVE
Lab: POSITIVE — AB

## 2017-03-28 MED ORDER — SODIUM CHLORIDE 0.9 % IV SOLP
1000 mL | Freq: Once | INTRAVENOUS | 0 refills | Status: CP
Start: 2017-03-28 — End: ?
  Administered 2017-03-28: 20:00:00 1000 mL via INTRAVENOUS

## 2017-03-28 MED ORDER — HEPARIN, PORCINE (PF) 100 UNIT/ML IV SYRG
500 [IU] | Freq: Once | 0 refills | Status: CP
Start: 2017-03-28 — End: ?

## 2017-03-28 MED ORDER — SODIUM CHLORIDE 0.9 % IV SOLP
1000 mL | Freq: Once | INTRAVENOUS | 0 refills | Status: CN
Start: 2017-03-28 — End: ?

## 2017-03-28 MED ORDER — AMOXICILLIN-POT CLAVULANATE 500-125 MG PO TAB
1 | ORAL_TABLET | Freq: Two times a day (BID) | ORAL | 0 refills | 7.00000 days | Status: AC
Start: 2017-03-28 — End: 2017-03-29

## 2017-03-29 ENCOUNTER — Encounter: Admit: 2017-03-29 | Discharge: 2017-03-29 | Payer: Private Health Insurance - Indemnity

## 2017-03-29 DIAGNOSIS — Z171 Estrogen receptor negative status [ER-]: ICD-10-CM

## 2017-03-29 DIAGNOSIS — C50311 Malignant neoplasm of lower-inner quadrant of right female breast: Principal | ICD-10-CM

## 2017-03-29 MED ORDER — HEPARIN, PORCINE (PF) 100 UNIT/ML IV SYRG
500 [IU] | Freq: Once | 0 refills | Status: CP
Start: 2017-03-29 — End: ?

## 2017-03-29 MED ORDER — SODIUM CHLORIDE 0.9 % IV SOLP
1000 mL | Freq: Once | INTRAVENOUS | 0 refills | Status: CP
Start: 2017-03-29 — End: ?
  Administered 2017-03-29: 19:00:00 1000 mL via INTRAVENOUS

## 2017-03-29 MED ORDER — CEFDINIR 300 MG PO CAP
300 mg | ORAL_CAPSULE | Freq: Two times a day (BID) | ORAL | 0 refills | 8.00000 days | Status: AC
Start: 2017-03-29 — End: 2017-04-13

## 2017-03-30 LAB — CULTURE-URINE W/SENSITIVITY

## 2017-03-31 DIAGNOSIS — Z5111 Encounter for antineoplastic chemotherapy: ICD-10-CM

## 2017-03-31 DIAGNOSIS — C50311 Malignant neoplasm of lower-inner quadrant of right female breast: Principal | ICD-10-CM

## 2017-03-31 DIAGNOSIS — Z171 Estrogen receptor negative status [ER-]: ICD-10-CM

## 2017-04-12 ENCOUNTER — Encounter: Admit: 2017-04-12 | Discharge: 2017-04-12 | Payer: Private Health Insurance - Indemnity

## 2017-04-12 DIAGNOSIS — E781 Pure hyperglyceridemia: Principal | ICD-10-CM

## 2017-04-13 ENCOUNTER — Encounter: Admit: 2017-04-13 | Discharge: 2017-04-14 | Payer: Private Health Insurance - Indemnity

## 2017-04-13 ENCOUNTER — Encounter: Admit: 2017-04-13 | Discharge: 2017-04-13 | Payer: Private Health Insurance - Indemnity

## 2017-04-13 DIAGNOSIS — Z171 Estrogen receptor negative status [ER-]: ICD-10-CM

## 2017-04-13 DIAGNOSIS — Z5111 Encounter for antineoplastic chemotherapy: ICD-10-CM

## 2017-04-13 DIAGNOSIS — E785 Hyperlipidemia, unspecified: Principal | ICD-10-CM

## 2017-04-13 DIAGNOSIS — N904 Leukoplakia of vulva: ICD-10-CM

## 2017-04-13 DIAGNOSIS — C50311 Malignant neoplasm of lower-inner quadrant of right female breast: Principal | ICD-10-CM

## 2017-04-13 DIAGNOSIS — Z0389 Encounter for observation for other suspected diseases and conditions ruled out: ICD-10-CM

## 2017-04-13 DIAGNOSIS — I10 Essential (primary) hypertension: ICD-10-CM

## 2017-04-13 DIAGNOSIS — D693 Immune thrombocytopenic purpura: ICD-10-CM

## 2017-04-13 DIAGNOSIS — I519 Heart disease, unspecified: ICD-10-CM

## 2017-04-13 DIAGNOSIS — D72819 Decreased white blood cell count, unspecified: ICD-10-CM

## 2017-04-13 DIAGNOSIS — C50919 Malignant neoplasm of unspecified site of unspecified female breast: ICD-10-CM

## 2017-04-13 LAB — COMPREHENSIVE METABOLIC PANEL
Lab: 0.4 mg/dL (ref 0.3–1.2)
Lab: 0.7 mg/dL (ref 0.4–1.00)
Lab: 103 MMOL/L — ABNORMAL LOW (ref 98–110)
Lab: 122 mg/dL — ABNORMAL HIGH (ref 70–100)
Lab: 133 MMOL/L — ABNORMAL LOW (ref 137–147)
Lab: 14 U/L — ABNORMAL LOW (ref 7–40)
Lab: 17 U/L (ref 7–56)
Lab: 20 mg/dL (ref 7–25)
Lab: 24 MMOL/L (ref 21–30)
Lab: 4.2 g/dL — ABNORMAL HIGH (ref 3.5–5.0)
Lab: 4.3 MMOL/L — ABNORMAL LOW (ref 3.5–5.1)
Lab: 51 U/L — ABNORMAL LOW (ref 25–110)
Lab: 6 10*3/uL (ref 3–12)
Lab: 6.6 g/dL — ABNORMAL LOW (ref 6.0–8.0)
Lab: 8.6 mg/dL — ABNORMAL HIGH (ref 8.5–10.6)
Lab: >60 mL/min (ref >60)
Lab: >60 mL/min — ABNORMAL LOW (ref >60)

## 2017-04-13 LAB — CBC AND DIFF
Lab: 0 10*3/uL (ref 0–0.20)
Lab: 0 10*3/uL (ref 0–0.45)
Lab: 6.1 10*3/uL (ref 4.5–11.0)

## 2017-04-13 MED ORDER — DEXAMETHASONE IVPB
12 mg | Freq: Once | INTRAVENOUS | 0 refills | Status: CN
Start: 2017-04-13 — End: ?

## 2017-04-13 MED ORDER — HEPARIN, PORCINE (PF) 100 UNIT/ML IV SYRG
500 [IU] | Freq: Once | INTRAVENOUS | 0 refills | Status: CP
Start: 2017-04-13 — End: ?
  Administered 2017-04-13: 20:00:00 500 [IU] via INTRAVENOUS

## 2017-04-13 MED ORDER — APREPITANT 7.2 MG/ML IV EMUL
130 mg | Freq: Once | INTRAVENOUS | 0 refills | Status: CP
Start: 2017-04-13 — End: ?
  Administered 2017-04-13: 17:00:00 130 mg via INTRAVENOUS

## 2017-04-13 MED ORDER — APREPITANT 7.2 MG/ML IV EMUL
130 mg | Freq: Once | INTRAVENOUS | 0 refills | Status: CN
Start: 2017-04-13 — End: ?

## 2017-04-13 MED ORDER — DOCETAXEL IVPB
60 mg/m2 | Freq: Once | INTRAVENOUS | 0 refills | Status: CP
Start: 2017-04-13 — End: ?
  Administered 2017-04-13: 18:00:00 123.6 mg via INTRAVENOUS

## 2017-04-13 MED ORDER — PEGFILGRASTIM 6 MG/0.6ML SC SYRG
6 mg | Freq: Once | SUBCUTANEOUS | 0 refills | Status: CN
Start: 2017-04-13 — End: ?

## 2017-04-13 MED ORDER — CARBOPLATIN IVPB (BY AUC-SWOG)
638 mg | Freq: Once | INTRAVENOUS | 0 refills | Status: CP
Start: 2017-04-13 — End: ?
  Administered 2017-04-13 (×2): 638 mg via INTRAVENOUS

## 2017-04-13 MED ORDER — CARBOPLATIN IVPB (BY AUC-SWOG)
638 mg | Freq: Once | INTRAVENOUS | 0 refills | Status: CN
Start: 2017-04-13 — End: ?

## 2017-04-13 MED ORDER — DOCETAXEL IVPB
60 mg/m2 | Freq: Once | INTRAVENOUS | 0 refills | Status: CN
Start: 2017-04-13 — End: ?

## 2017-04-13 MED ORDER — PALONOSETRON 0.25 MG/5 ML IV SOLN
.25 mg | Freq: Once | INTRAVENOUS | 0 refills | Status: CP
Start: 2017-04-13 — End: ?
  Administered 2017-04-13: 17:00:00 0.25 mg via INTRAVENOUS

## 2017-04-13 MED ORDER — DEXAMETHASONE IVPB
12 mg | Freq: Once | INTRAVENOUS | 0 refills | Status: CP
Start: 2017-04-13 — End: ?
  Administered 2017-04-13 (×2): 12 mg via INTRAVENOUS

## 2017-04-13 MED ORDER — PALONOSETRON 0.25 MG/5 ML IV SOLN
.25 mg | Freq: Once | INTRAVENOUS | 0 refills | Status: CN
Start: 2017-04-13 — End: ?

## 2017-04-14 ENCOUNTER — Encounter: Admit: 2017-04-14 | Discharge: 2017-04-14 | Payer: Private Health Insurance - Indemnity

## 2017-04-14 DIAGNOSIS — Z5111 Encounter for antineoplastic chemotherapy: Principal | ICD-10-CM

## 2017-04-14 DIAGNOSIS — C50311 Malignant neoplasm of lower-inner quadrant of right female breast: ICD-10-CM

## 2017-04-14 DIAGNOSIS — Z171 Estrogen receptor negative status [ER-]: ICD-10-CM

## 2017-04-14 MED ORDER — PEGFILGRASTIM 6 MG/0.6ML SC SYRG
6 mg | Freq: Once | SUBCUTANEOUS | 0 refills | Status: CP
Start: 2017-04-14 — End: ?
  Administered 2017-04-14: 20:00:00 6 mg via SUBCUTANEOUS

## 2017-04-18 ENCOUNTER — Encounter: Admit: 2017-04-18 | Discharge: 2017-04-18 | Payer: Private Health Insurance - Indemnity

## 2017-04-18 DIAGNOSIS — Z171 Estrogen receptor negative status [ER-]: ICD-10-CM

## 2017-04-18 DIAGNOSIS — E860 Dehydration: Principal | ICD-10-CM

## 2017-04-18 DIAGNOSIS — C50311 Malignant neoplasm of lower-inner quadrant of right female breast: ICD-10-CM

## 2017-04-18 MED ORDER — SODIUM CHLORIDE 0.9 % IV SOLP
1000 mL | Freq: Once | INTRAVENOUS | 0 refills | Status: CP
Start: 2017-04-18 — End: ?
  Administered 2017-04-18: 19:00:00 1000 mL via INTRAVENOUS

## 2017-04-18 MED ORDER — HEPARIN, PORCINE (PF) 100 UNIT/ML IV SYRG
500 [IU] | Freq: Once | 0 refills | Status: CP
Start: 2017-04-18 — End: ?

## 2017-04-19 ENCOUNTER — Encounter: Admit: 2017-04-19 | Discharge: 2017-04-19 | Payer: Private Health Insurance - Indemnity

## 2017-04-19 DIAGNOSIS — C50311 Malignant neoplasm of lower-inner quadrant of right female breast: Principal | ICD-10-CM

## 2017-04-19 DIAGNOSIS — Z171 Estrogen receptor negative status [ER-]: ICD-10-CM

## 2017-04-19 MED ORDER — SODIUM CHLORIDE 0.9 % IV SOLP
1000 mL | Freq: Once | INTRAVENOUS | 0 refills | Status: CP
Start: 2017-04-19 — End: ?
  Administered 2017-04-19: 19:00:00 1000 mL via INTRAVENOUS

## 2017-04-19 MED ORDER — HEPARIN, PORCINE (PF) 100 UNIT/ML IV SYRG
500 [IU] | Freq: Once | 0 refills | Status: CP
Start: 2017-04-19 — End: ?

## 2017-04-21 ENCOUNTER — Encounter: Admit: 2017-04-21 | Discharge: 2017-04-21 | Payer: Private Health Insurance - Indemnity

## 2017-04-21 DIAGNOSIS — E781 Pure hyperglyceridemia: Principal | ICD-10-CM

## 2017-04-21 LAB — LIPID PROFILE
Lab: 14
Lab: 3
Lab: 33 — ABNORMAL LOW (ref 35–60)
Lab: 43
Lab: 98 — ABNORMAL LOW (ref 150–200)
Lab: 70

## 2017-04-24 ENCOUNTER — Ambulatory Visit: Admit: 2017-04-24 | Discharge: 2017-04-25 | Payer: Private Health Insurance - Indemnity

## 2017-04-24 ENCOUNTER — Encounter: Admit: 2017-04-24 | Discharge: 2017-04-24 | Payer: Private Health Insurance - Indemnity

## 2017-04-24 DIAGNOSIS — R931 Abnormal findings on diagnostic imaging of heart and coronary circulation: ICD-10-CM

## 2017-04-24 DIAGNOSIS — D72819 Decreased white blood cell count, unspecified: ICD-10-CM

## 2017-04-24 DIAGNOSIS — Z0389 Encounter for observation for other suspected diseases and conditions ruled out: ICD-10-CM

## 2017-04-24 DIAGNOSIS — I10 Essential (primary) hypertension: Principal | ICD-10-CM

## 2017-04-24 DIAGNOSIS — C50311 Malignant neoplasm of lower-inner quadrant of right female breast: ICD-10-CM

## 2017-04-24 DIAGNOSIS — E785 Hyperlipidemia, unspecified: Principal | ICD-10-CM

## 2017-04-24 DIAGNOSIS — I2510 Atherosclerotic heart disease of native coronary artery without angina pectoris: ICD-10-CM

## 2017-04-24 DIAGNOSIS — D693 Immune thrombocytopenic purpura: ICD-10-CM

## 2017-04-24 DIAGNOSIS — C50919 Malignant neoplasm of unspecified site of unspecified female breast: ICD-10-CM

## 2017-04-24 DIAGNOSIS — N904 Leukoplakia of vulva: ICD-10-CM

## 2017-04-24 DIAGNOSIS — I519 Heart disease, unspecified: ICD-10-CM

## 2017-04-25 ENCOUNTER — Encounter: Admit: 2017-04-25 | Discharge: 2017-04-25 | Payer: Private Health Insurance - Indemnity

## 2017-04-27 ENCOUNTER — Encounter: Admit: 2017-04-27 | Discharge: 2017-04-27 | Payer: Private Health Insurance - Indemnity

## 2017-04-27 DIAGNOSIS — R0789 Other chest pain: ICD-10-CM

## 2017-04-27 DIAGNOSIS — C50311 Malignant neoplasm of lower-inner quadrant of right female breast: Principal | ICD-10-CM

## 2017-04-27 DIAGNOSIS — R0600 Dyspnea, unspecified: Principal | ICD-10-CM

## 2017-04-27 LAB — CBC AND DIFF

## 2017-04-27 MED ORDER — SODIUM CHLORIDE 0.9 % IV SOLP
1000 mL | Freq: Once | INTRAVENOUS | 0 refills | Status: CN
Start: 2017-04-27 — End: ?

## 2017-04-28 ENCOUNTER — Encounter: Admit: 2017-04-28 | Discharge: 2017-04-28 | Payer: Private Health Insurance - Indemnity

## 2017-04-28 DIAGNOSIS — R1310 Dysphagia, unspecified: Principal | ICD-10-CM

## 2017-04-28 DIAGNOSIS — C50311 Malignant neoplasm of lower-inner quadrant of right female breast: Principal | ICD-10-CM

## 2017-04-28 DIAGNOSIS — K219 Gastro-esophageal reflux disease without esophagitis: ICD-10-CM

## 2017-04-28 DIAGNOSIS — Z171 Estrogen receptor negative status [ER-]: ICD-10-CM

## 2017-04-28 MED ORDER — DIPHENHYDRAMINE(#)/LIDOCAINE/ANTACID 1:1:1 PO SUSP
30 mL | Freq: Three times a day (TID) | ORAL | 0 refills | 30.00000 days | Status: AC | PRN
Start: 2017-04-28 — End: 2017-06-22

## 2017-04-28 MED ORDER — SODIUM CHLORIDE 0.9 % IV SOLP
1000 mL | Freq: Once | INTRAVENOUS | 0 refills | Status: CP
Start: 2017-04-28 — End: ?
  Administered 2017-04-28: 18:00:00 1000 mL via INTRAVENOUS

## 2017-04-28 MED ORDER — HEPARIN, PORCINE (PF) 100 UNIT/ML IV SYRG
500 [IU] | Freq: Once | 0 refills | Status: CP
Start: 2017-04-28 — End: ?

## 2017-04-28 MED ORDER — OMEPRAZOLE 40 MG PO CPDR
40 mg | ORAL_CAPSULE | Freq: Two times a day (BID) | ORAL | 0 refills | Status: AC
Start: 2017-04-28 — End: 2017-06-30

## 2017-05-04 ENCOUNTER — Encounter: Admit: 2017-05-04 | Discharge: 2017-05-04 | Payer: Private Health Insurance - Indemnity

## 2017-05-04 ENCOUNTER — Encounter: Admit: 2017-05-04 | Discharge: 2017-05-05 | Payer: Private Health Insurance - Indemnity

## 2017-05-04 DIAGNOSIS — N904 Leukoplakia of vulva: ICD-10-CM

## 2017-05-04 DIAGNOSIS — K521 Toxic gastroenteritis and colitis: ICD-10-CM

## 2017-05-04 DIAGNOSIS — Z0389 Encounter for observation for other suspected diseases and conditions ruled out: ICD-10-CM

## 2017-05-04 DIAGNOSIS — C50311 Malignant neoplasm of lower-inner quadrant of right female breast: Principal | ICD-10-CM

## 2017-05-04 DIAGNOSIS — D649 Anemia, unspecified: Principal | ICD-10-CM

## 2017-05-04 DIAGNOSIS — C50919 Malignant neoplasm of unspecified site of unspecified female breast: ICD-10-CM

## 2017-05-04 DIAGNOSIS — D6959 Other secondary thrombocytopenia: ICD-10-CM

## 2017-05-04 DIAGNOSIS — R0600 Dyspnea, unspecified: Principal | ICD-10-CM

## 2017-05-04 DIAGNOSIS — R002 Palpitations: ICD-10-CM

## 2017-05-04 DIAGNOSIS — T732XXA Exhaustion due to exposure, initial encounter: ICD-10-CM

## 2017-05-04 DIAGNOSIS — Z87891 Personal history of nicotine dependence: ICD-10-CM

## 2017-05-04 DIAGNOSIS — R110 Nausea: ICD-10-CM

## 2017-05-04 DIAGNOSIS — I10 Essential (primary) hypertension: ICD-10-CM

## 2017-05-04 DIAGNOSIS — Z171 Estrogen receptor negative status [ER-]: ICD-10-CM

## 2017-05-04 DIAGNOSIS — D72819 Decreased white blood cell count, unspecified: ICD-10-CM

## 2017-05-04 DIAGNOSIS — T451X5A Adverse effect of antineoplastic and immunosuppressive drugs, initial encounter: ICD-10-CM

## 2017-05-04 DIAGNOSIS — R0789 Other chest pain: ICD-10-CM

## 2017-05-04 DIAGNOSIS — I519 Heart disease, unspecified: ICD-10-CM

## 2017-05-04 DIAGNOSIS — E785 Hyperlipidemia, unspecified: Principal | ICD-10-CM

## 2017-05-04 DIAGNOSIS — D693 Immune thrombocytopenic purpura: ICD-10-CM

## 2017-05-04 LAB — VITAMIN B12: Lab: 427 pg/mL — ABNORMAL HIGH (ref 180–914)

## 2017-05-04 LAB — IRON + BINDING CAPACITY + %SAT+ FERRITIN
Lab: 27 % — ABNORMAL LOW (ref 28–42)
Lab: 273 ug/dL (ref 270–380)
Lab: 472 ng/mL — ABNORMAL HIGH (ref 10–200)
Lab: 75 ug/dL (ref 50–160)

## 2017-05-04 LAB — CBC AND DIFF: Lab: 6.4 10*3/uL (ref 4.5–11.0)

## 2017-05-04 LAB — COMPREHENSIVE METABOLIC PANEL
Lab: 0.6 mg/dL (ref 0.4–1.00)
Lab: 100 MMOL/L — ABNORMAL LOW (ref 98–110)
Lab: 133 MMOL/L — ABNORMAL LOW (ref 137–147)
Lab: 17 mg/dL — ABNORMAL HIGH (ref 7–25)
Lab: 209 mg/dL — ABNORMAL HIGH (ref 70–100)
Lab: 3.7 MMOL/L — ABNORMAL LOW (ref 3.5–5.1)

## 2017-05-04 LAB — URINALYSIS DIPSTICK
Lab: NEGATIVE
Lab: NEGATIVE
Lab: NEGATIVE
Lab: NEGATIVE

## 2017-05-04 LAB — POC CREATININE, RAD: Lab: 0.5 mg/dL (ref 0.4–1.00)

## 2017-05-04 LAB — FOLATE, SERUM: Lab: 13 ng/mL (ref >3.9)

## 2017-05-04 MED ORDER — DOCETAXEL IVPB
60 mg/m2 | Freq: Once | INTRAVENOUS | 0 refills | Status: CN
Start: 2017-05-04 — End: ?

## 2017-05-04 MED ORDER — LORAZEPAM 0.5 MG PO TAB
1 | ORAL_TABLET | ORAL | 0 refills | 12.00000 days | Status: AC | PRN
Start: 2017-05-04 — End: 2017-06-28

## 2017-05-04 MED ORDER — PALONOSETRON 0.25 MG/5 ML IV SOLN
.25 mg | Freq: Once | INTRAVENOUS | 0 refills | Status: CN
Start: 2017-05-04 — End: ?

## 2017-05-04 MED ORDER — HEPARIN, PORCINE (PF) 100 UNIT/ML IV SYRG
500 [IU] | Freq: Once | 0 refills | Status: CP
Start: 2017-05-04 — End: ?

## 2017-05-04 MED ORDER — PEGFILGRASTIM 6 MG/0.6ML SC SYRG
6 mg | Freq: Once | SUBCUTANEOUS | 0 refills | Status: CN
Start: 2017-05-04 — End: ?

## 2017-05-04 MED ORDER — CARBOPLATIN IVPB (BY AUC-SWOG)
631 mg | Freq: Once | INTRAVENOUS | 0 refills | Status: CN
Start: 2017-05-04 — End: ?

## 2017-05-04 MED ORDER — DEXAMETHASONE IVPB
12 mg | Freq: Once | INTRAVENOUS | 0 refills | Status: CN
Start: 2017-05-04 — End: ?

## 2017-05-04 MED ORDER — APREPITANT 7.2 MG/ML IV EMUL
130 mg | Freq: Once | INTRAVENOUS | 0 refills | Status: CN
Start: 2017-05-04 — End: ?

## 2017-05-05 ENCOUNTER — Encounter: Admit: 2017-05-05 | Discharge: 2017-05-05 | Payer: Private Health Insurance - Indemnity

## 2017-05-05 DIAGNOSIS — R0609 Other forms of dyspnea: ICD-10-CM

## 2017-05-05 DIAGNOSIS — R0600 Dyspnea, unspecified: ICD-10-CM

## 2017-05-05 DIAGNOSIS — Z170 Estrogen receptor positive status [ER+]: ICD-10-CM

## 2017-05-05 DIAGNOSIS — D649 Anemia, unspecified: ICD-10-CM

## 2017-05-05 DIAGNOSIS — C50311 Malignant neoplasm of lower-inner quadrant of right female breast: Principal | ICD-10-CM

## 2017-05-05 LAB — CULTURE-URINE W/SENSITIVITY: Lab: <10

## 2017-05-05 MED ORDER — HEPARIN, PORCINE (PF) 100 UNIT/ML IV SYRG
500 [IU] | Freq: Once | INTRAVENOUS | 0 refills | Status: CP
Start: 2017-05-05 — End: ?
  Administered 2017-05-05: 17:00:00 500 [IU] via INTRAVENOUS

## 2017-05-08 ENCOUNTER — Encounter: Admit: 2017-05-08 | Discharge: 2017-05-08 | Payer: Private Health Insurance - Indemnity

## 2017-05-10 ENCOUNTER — Encounter: Admit: 2017-05-10 | Discharge: 2017-05-10 | Payer: Private Health Insurance - Indemnity

## 2017-05-10 DIAGNOSIS — R0600 Dyspnea, unspecified: Principal | ICD-10-CM

## 2017-05-11 ENCOUNTER — Encounter: Admit: 2017-05-11 | Discharge: 2017-05-12 | Payer: Private Health Insurance - Indemnity

## 2017-05-11 ENCOUNTER — Encounter: Admit: 2017-05-11 | Discharge: 2017-05-11 | Payer: Private Health Insurance - Indemnity

## 2017-05-11 DIAGNOSIS — R110 Nausea: ICD-10-CM

## 2017-05-11 DIAGNOSIS — R5383 Other fatigue: ICD-10-CM

## 2017-05-11 DIAGNOSIS — R002 Palpitations: ICD-10-CM

## 2017-05-11 DIAGNOSIS — D72819 Decreased white blood cell count, unspecified: ICD-10-CM

## 2017-05-11 DIAGNOSIS — R197 Diarrhea, unspecified: ICD-10-CM

## 2017-05-11 DIAGNOSIS — Z171 Estrogen receptor negative status [ER-]: ICD-10-CM

## 2017-05-11 DIAGNOSIS — I10 Essential (primary) hypertension: ICD-10-CM

## 2017-05-11 DIAGNOSIS — D6959 Other secondary thrombocytopenia: ICD-10-CM

## 2017-05-11 DIAGNOSIS — C50311 Malignant neoplasm of lower-inner quadrant of right female breast: Principal | ICD-10-CM

## 2017-05-11 DIAGNOSIS — N904 Leukoplakia of vulva: ICD-10-CM

## 2017-05-11 DIAGNOSIS — D693 Immune thrombocytopenic purpura: ICD-10-CM

## 2017-05-11 DIAGNOSIS — K219 Gastro-esophageal reflux disease without esophagitis: ICD-10-CM

## 2017-05-11 DIAGNOSIS — T451X5A Adverse effect of antineoplastic and immunosuppressive drugs, initial encounter: ICD-10-CM

## 2017-05-11 DIAGNOSIS — D6481 Anemia due to antineoplastic chemotherapy: ICD-10-CM

## 2017-05-11 DIAGNOSIS — R0609 Other forms of dyspnea: ICD-10-CM

## 2017-05-11 DIAGNOSIS — E785 Hyperlipidemia, unspecified: Principal | ICD-10-CM

## 2017-05-11 DIAGNOSIS — C50919 Malignant neoplasm of unspecified site of unspecified female breast: ICD-10-CM

## 2017-05-11 DIAGNOSIS — I519 Heart disease, unspecified: ICD-10-CM

## 2017-05-11 DIAGNOSIS — Z5111 Encounter for antineoplastic chemotherapy: ICD-10-CM

## 2017-05-11 DIAGNOSIS — Z0389 Encounter for observation for other suspected diseases and conditions ruled out: ICD-10-CM

## 2017-05-11 LAB — COMPREHENSIVE METABOLIC PANEL
Lab: 0.4 mg/dL (ref 0.3–1.2)
Lab: 0.8 mg/dL (ref 0.4–1.00)
Lab: 100 MMOL/L — ABNORMAL LOW (ref 98–110)
Lab: 132 MMOL/L — ABNORMAL LOW (ref 137–147)
Lab: 188 mg/dL — ABNORMAL HIGH (ref 70–100)
Lab: 21 mg/dL — ABNORMAL HIGH (ref 7–25)
Lab: 3.9 MMOL/L — ABNORMAL LOW (ref 3.5–5.1)
Lab: 4.2 g/dL — ABNORMAL HIGH (ref 3.5–5.0)
Lab: 41 U/L — ABNORMAL LOW (ref 25–110)
Lab: 6.7 g/dL — ABNORMAL LOW (ref 6.0–8.0)
Lab: 9.1 mg/dL — ABNORMAL HIGH (ref 8.5–10.6)

## 2017-05-11 LAB — CBC AND DIFF: Lab: 6.7 10*3/uL (ref 4.5–11.0)

## 2017-05-11 MED ORDER — DEXAMETHASONE IVPB
12 mg | Freq: Once | INTRAVENOUS | 0 refills | Status: CP
Start: 2017-05-11 — End: ?
  Administered 2017-05-11: 15:00:00 12 mg via INTRAVENOUS

## 2017-05-11 MED ORDER — PALONOSETRON 0.25 MG/5 ML IV SOLN
.25 mg | Freq: Once | INTRAVENOUS | 0 refills | Status: CP
Start: 2017-05-11 — End: ?
  Administered 2017-05-11: 15:00:00 0.25 mg via INTRAVENOUS

## 2017-05-11 MED ORDER — DOCETAXEL IVPB
45 mg/m2 | Freq: Once | INTRAVENOUS | 0 refills | Status: CP
Start: 2017-05-11 — End: ?
  Administered 2017-05-11 (×2): 92.7 mg via INTRAVENOUS

## 2017-05-11 MED ORDER — HEPARIN, PORCINE (PF) 100 UNIT/ML IV SYRG
500 [IU] | Freq: Once | 0 refills | Status: CP
Start: 2017-05-11 — End: ?

## 2017-05-11 MED ORDER — CARBOPLATIN IVPB (BY AUC-SWOG)
457.56 mg | Freq: Once | INTRAVENOUS | 0 refills | Status: CP
Start: 2017-05-11 — End: ?
  Administered 2017-05-11 (×2): 457.56 mg via INTRAVENOUS

## 2017-05-11 MED ORDER — CARBOPLATIN IVPB (BY AUC-SWOG)
457.56 mg | Freq: Once | INTRAVENOUS | 0 refills | Status: CN
Start: 2017-05-11 — End: ?

## 2017-05-11 MED ORDER — APREPITANT 7.2 MG/ML IV EMUL
130 mg | Freq: Once | INTRAVENOUS | 0 refills | Status: CP
Start: 2017-05-11 — End: ?
  Administered 2017-05-11: 15:00:00 130 mg via INTRAVENOUS

## 2017-05-11 MED ORDER — DOCETAXEL IVPB
45 mg/m2 | Freq: Once | INTRAVENOUS | 0 refills | Status: CN
Start: 2017-05-11 — End: ?

## 2017-05-12 ENCOUNTER — Encounter: Admit: 2017-05-12 | Discharge: 2017-05-12 | Payer: Private Health Insurance - Indemnity

## 2017-05-12 DIAGNOSIS — C50311 Malignant neoplasm of lower-inner quadrant of right female breast: ICD-10-CM

## 2017-05-12 DIAGNOSIS — Z5111 Encounter for antineoplastic chemotherapy: Principal | ICD-10-CM

## 2017-05-12 DIAGNOSIS — Z171 Estrogen receptor negative status [ER-]: ICD-10-CM

## 2017-05-12 MED ORDER — SODIUM CHLORIDE 0.9 % IV SOLP
1000 mL | Freq: Once | INTRAVENOUS | 0 refills | Status: CP
Start: 2017-05-12 — End: ?
  Administered 2017-05-12: 18:00:00 1000 mL via INTRAVENOUS

## 2017-05-12 MED ORDER — PEGFILGRASTIM 6 MG/0.6ML SC SYRG
6 mg | Freq: Once | SUBCUTANEOUS | 0 refills | Status: CP
Start: 2017-05-12 — End: ?
  Administered 2017-05-12: 18:00:00 6 mg via SUBCUTANEOUS

## 2017-05-12 MED ORDER — HEPARIN, PORCINE (PF) 100 UNIT/ML IV SYRG
500 [IU] | Freq: Once | 0 refills | Status: CP
Start: 2017-05-12 — End: ?

## 2017-05-15 ENCOUNTER — Encounter: Admit: 2017-05-15 | Discharge: 2017-05-15 | Payer: Private Health Insurance - Indemnity

## 2017-05-15 DIAGNOSIS — C50311 Malignant neoplasm of lower-inner quadrant of right female breast: Secondary | ICD-10-CM

## 2017-05-15 DIAGNOSIS — Z5111 Encounter for antineoplastic chemotherapy: Principal | ICD-10-CM

## 2017-05-15 DIAGNOSIS — Z171 Estrogen receptor negative status [ER-]: ICD-10-CM

## 2017-05-15 MED ORDER — HEPARIN, PORCINE (PF) 100 UNIT/ML IV SYRG
500 [IU] | Freq: Once | 0 refills | Status: CP
Start: 2017-05-15 — End: ?

## 2017-05-15 MED ORDER — PALONOSETRON 0.25 MG/5 ML IV SOLN
0.25 mg | Freq: Once | INTRAVENOUS | 0 refills | Status: CP
Start: 2017-05-15 — End: ?
  Administered 2017-05-15: 18:00:00 0.25 mg via INTRAVENOUS

## 2017-05-15 MED ORDER — SODIUM CHLORIDE 0.9 % IV SOLP
1000 mL | Freq: Once | INTRAVENOUS | 0 refills | Status: CP
Start: 2017-05-15 — End: ?
  Administered 2017-05-15: 18:00:00 1000 mL via INTRAVENOUS

## 2017-05-16 ENCOUNTER — Encounter: Admit: 2017-05-16 | Discharge: 2017-05-16 | Payer: Private Health Insurance - Indemnity

## 2017-05-16 DIAGNOSIS — Z171 Estrogen receptor negative status [ER-]: ICD-10-CM

## 2017-05-16 DIAGNOSIS — C50311 Malignant neoplasm of lower-inner quadrant of right female breast: Principal | ICD-10-CM

## 2017-05-16 MED ORDER — HEPARIN, PORCINE (PF) 100 UNIT/ML IV SYRG
500 [IU] | Freq: Once | 0 refills | Status: CP
Start: 2017-05-16 — End: ?

## 2017-05-16 MED ORDER — SODIUM CHLORIDE 0.9 % IV SOLP
1000 mL | Freq: Once | INTRAVENOUS | 0 refills | Status: CP
Start: 2017-05-16 — End: ?
  Administered 2017-05-16: 18:00:00 1000 mL via INTRAVENOUS

## 2017-05-17 ENCOUNTER — Encounter: Admit: 2017-05-17 | Discharge: 2017-05-17 | Payer: Private Health Insurance - Indemnity

## 2017-05-17 ENCOUNTER — Encounter: Admit: 2017-05-17 | Discharge: 2017-05-18 | Payer: Private Health Insurance - Indemnity

## 2017-05-17 DIAGNOSIS — R309 Painful micturition, unspecified: ICD-10-CM

## 2017-05-17 DIAGNOSIS — C50311 Malignant neoplasm of lower-inner quadrant of right female breast: Principal | ICD-10-CM

## 2017-05-17 DIAGNOSIS — Z171 Estrogen receptor negative status [ER-]: ICD-10-CM

## 2017-05-17 LAB — URINALYSIS DIPSTICK
Lab: 1 (ref 1.003–1.035)
Lab: 6 (ref 5.0–8.0)
Lab: NEGATIVE
Lab: NEGATIVE
Lab: NEGATIVE
Lab: NEGATIVE
Lab: NEGATIVE
Lab: NEGATIVE
Lab: NEGATIVE

## 2017-05-17 MED ORDER — SODIUM CHLORIDE 0.9 % IV SOLP
1000 mL | Freq: Once | INTRAVENOUS | 0 refills | Status: CP
Start: 2017-05-17 — End: ?
  Administered 2017-05-17: 18:00:00 1000 mL via INTRAVENOUS

## 2017-05-17 MED ORDER — HEPARIN, PORCINE (PF) 100 UNIT/ML IV SYRG
500 [IU] | Freq: Once | 0 refills | Status: CP
Start: 2017-05-17 — End: ?

## 2017-05-19 ENCOUNTER — Encounter: Admit: 2017-05-19 | Discharge: 2017-05-19 | Payer: Private Health Insurance - Indemnity

## 2017-05-19 LAB — CULTURE-URINE W/SENSITIVITY: Lab: <10

## 2017-05-24 ENCOUNTER — Encounter: Admit: 2017-05-24 | Discharge: 2017-05-24 | Payer: Private Health Insurance - Indemnity

## 2017-05-26 ENCOUNTER — Ambulatory Visit: Admit: 2017-05-26 | Discharge: 2017-05-26 | Payer: Private Health Insurance - Indemnity

## 2017-05-26 ENCOUNTER — Encounter: Admit: 2017-05-26 | Discharge: 2017-05-26 | Payer: Private Health Insurance - Indemnity

## 2017-06-01 ENCOUNTER — Encounter: Admit: 2017-06-01 | Discharge: 2017-06-01 | Payer: Private Health Insurance - Indemnity

## 2017-06-01 ENCOUNTER — Encounter: Admit: 2017-06-01 | Discharge: 2017-06-02 | Payer: Private Health Insurance - Indemnity

## 2017-06-01 DIAGNOSIS — I10 Essential (primary) hypertension: ICD-10-CM

## 2017-06-01 DIAGNOSIS — K5903 Drug induced constipation: ICD-10-CM

## 2017-06-01 DIAGNOSIS — D61810 Antineoplastic chemotherapy induced pancytopenia: ICD-10-CM

## 2017-06-01 DIAGNOSIS — D6959 Other secondary thrombocytopenia: ICD-10-CM

## 2017-06-01 DIAGNOSIS — T732XXA Exhaustion due to exposure, initial encounter: ICD-10-CM

## 2017-06-01 DIAGNOSIS — R12 Heartburn: ICD-10-CM

## 2017-06-01 DIAGNOSIS — R002 Palpitations: ICD-10-CM

## 2017-06-01 DIAGNOSIS — Z171 Estrogen receptor negative status [ER-]: ICD-10-CM

## 2017-06-01 DIAGNOSIS — C50311 Malignant neoplasm of lower-inner quadrant of right female breast: Principal | ICD-10-CM

## 2017-06-01 DIAGNOSIS — T451X5A Adverse effect of antineoplastic and immunosuppressive drugs, initial encounter: ICD-10-CM

## 2017-06-01 DIAGNOSIS — D693 Immune thrombocytopenic purpura: ICD-10-CM

## 2017-06-01 DIAGNOSIS — R110 Nausea: ICD-10-CM

## 2017-06-01 DIAGNOSIS — E785 Hyperlipidemia, unspecified: Principal | ICD-10-CM

## 2017-06-01 DIAGNOSIS — Z5111 Encounter for antineoplastic chemotherapy: ICD-10-CM

## 2017-06-01 DIAGNOSIS — N904 Leukoplakia of vulva: ICD-10-CM

## 2017-06-01 DIAGNOSIS — K219 Gastro-esophageal reflux disease without esophagitis: ICD-10-CM

## 2017-06-01 DIAGNOSIS — Z0389 Encounter for observation for other suspected diseases and conditions ruled out: ICD-10-CM

## 2017-06-01 DIAGNOSIS — D72819 Decreased white blood cell count, unspecified: ICD-10-CM

## 2017-06-01 DIAGNOSIS — I519 Heart disease, unspecified: ICD-10-CM

## 2017-06-01 DIAGNOSIS — C50919 Malignant neoplasm of unspecified site of unspecified female breast: ICD-10-CM

## 2017-06-01 LAB — CBC AND DIFF
Lab: 1 % (ref 3–12)
Lab: 100 K/UL — ABNORMAL LOW (ref 150–400)
Lab: 4.4 10*3/uL (ref 1.8–7.0)
Lab: 6 % (ref 4–12)

## 2017-06-01 LAB — POC CREATININE, RAD: Lab: 0.6 mg/dL (ref 0.4–1.00)

## 2017-06-01 LAB — COMPREHENSIVE METABOLIC PANEL
Lab: 0.3 mg/dL (ref 0.3–1.2)
Lab: 4 g/dL (ref 3.5–5.0)
Lab: 51 U/L — ABNORMAL HIGH (ref 25–110)
Lab: 59 U/L — ABNORMAL HIGH (ref 7–40)
Lab: 67 U/L — ABNORMAL HIGH (ref 7–56)
Lab: 8.8 mg/dL — ABNORMAL HIGH (ref 8.5–10.6)
Lab: >60 mL/min (ref >60)
Lab: >60 mL/min (ref >60)

## 2017-06-01 MED ORDER — PALONOSETRON 0.25 MG/5 ML IV SOLN
.25 mg | Freq: Once | INTRAVENOUS | 0 refills | Status: CP
Start: 2017-06-01 — End: ?
  Administered 2017-06-01: 15:00:00 0.25 mg via INTRAVENOUS

## 2017-06-01 MED ORDER — CARBOPLATIN IVPB (BY AUC-SWOG)
457.56 mg | Freq: Once | INTRAVENOUS | 0 refills | Status: CN
Start: 2017-06-01 — End: ?

## 2017-06-01 MED ORDER — SODIUM CHLORIDE 0.9 % IV SOLP
1000 mL | Freq: Once | INTRAVENOUS | 0 refills | Status: CN
Start: 2017-06-01 — End: ?

## 2017-06-01 MED ORDER — PEGFILGRASTIM 6 MG/0.6ML SC SYRG
6 mg | Freq: Once | SUBCUTANEOUS | 0 refills | Status: CN
Start: 2017-06-01 — End: ?

## 2017-06-01 MED ORDER — PALONOSETRON 0.25 MG/5 ML IV SOLN
.25 mg | Freq: Once | INTRAVENOUS | 0 refills | Status: CN
Start: 2017-06-01 — End: ?

## 2017-06-01 MED ORDER — CARBOPLATIN IVPB (BY AUC-SWOG)
457.56 mg | Freq: Once | INTRAVENOUS | 0 refills | Status: CP
Start: 2017-06-01 — End: ?
  Administered 2017-06-01 (×2): 457.56 mg via INTRAVENOUS

## 2017-06-01 MED ORDER — DOCETAXEL IVPB
45 mg/m2 | Freq: Once | INTRAVENOUS | 0 refills | Status: CP
Start: 2017-06-01 — End: ?
  Administered 2017-06-01 (×2): 92.7 mg via INTRAVENOUS

## 2017-06-01 MED ORDER — APREPITANT 7.2 MG/ML IV EMUL
130 mg | Freq: Once | INTRAVENOUS | 0 refills | Status: CN
Start: 2017-06-01 — End: ?

## 2017-06-01 MED ORDER — HEPARIN, PORCINE (PF) 100 UNIT/ML IV SYRG
500 [IU] | Freq: Once | 0 refills | Status: CP
Start: 2017-06-01 — End: ?

## 2017-06-01 MED ORDER — APREPITANT 7.2 MG/ML IV EMUL
130 mg | Freq: Once | INTRAVENOUS | 0 refills | Status: CP
Start: 2017-06-01 — End: ?
  Administered 2017-06-01: 15:00:00 130 mg via INTRAVENOUS

## 2017-06-01 MED ORDER — DOCETAXEL IVPB
45 mg/m2 | Freq: Once | INTRAVENOUS | 0 refills | Status: CN
Start: 2017-06-01 — End: ?

## 2017-06-01 MED ORDER — DEXAMETHASONE IVPB
12 mg | Freq: Once | INTRAVENOUS | 0 refills | Status: CP
Start: 2017-06-01 — End: ?
  Administered 2017-06-01 (×2): 12 mg via INTRAVENOUS

## 2017-06-01 MED ORDER — DEXAMETHASONE IVPB
12 mg | Freq: Once | INTRAVENOUS | 0 refills | Status: CN
Start: 2017-06-01 — End: ?

## 2017-06-02 ENCOUNTER — Encounter: Admit: 2017-06-02 | Discharge: 2017-06-02 | Payer: Private Health Insurance - Indemnity

## 2017-06-02 DIAGNOSIS — C50311 Malignant neoplasm of lower-inner quadrant of right female breast: Secondary | ICD-10-CM

## 2017-06-02 DIAGNOSIS — Z5111 Encounter for antineoplastic chemotherapy: Principal | ICD-10-CM

## 2017-06-02 DIAGNOSIS — Z171 Estrogen receptor negative status [ER-]: ICD-10-CM

## 2017-06-02 MED ORDER — HEPARIN, PORCINE (PF) 100 UNIT/ML IV SYRG
500 [IU] | Freq: Once | 0 refills | Status: CP
Start: 2017-06-02 — End: ?

## 2017-06-02 MED ORDER — SODIUM CHLORIDE 0.9 % IV SOLP
1000 mL | Freq: Once | INTRAVENOUS | 0 refills | Status: CP
Start: 2017-06-02 — End: ?
  Administered 2017-06-02: 19:00:00 1000 mL via INTRAVENOUS

## 2017-06-02 MED ORDER — PEGFILGRASTIM 6 MG/0.6ML SC SYRG
6 mg | Freq: Once | SUBCUTANEOUS | 0 refills | Status: CP
Start: 2017-06-02 — End: ?
  Administered 2017-06-02: 19:00:00 6 mg via SUBCUTANEOUS

## 2017-06-05 ENCOUNTER — Encounter: Admit: 2017-06-05 | Discharge: 2017-06-05 | Payer: Private Health Insurance - Indemnity

## 2017-06-05 DIAGNOSIS — Z171 Estrogen receptor negative status [ER-]: ICD-10-CM

## 2017-06-05 DIAGNOSIS — C50311 Malignant neoplasm of lower-inner quadrant of right female breast: Principal | ICD-10-CM

## 2017-06-05 MED ORDER — PALONOSETRON 0.25 MG/5 ML IV SOLN
0.25 mg | Freq: Once | INTRAVENOUS | 0 refills | Status: AC
Start: 2017-06-05 — End: ?

## 2017-06-05 MED ORDER — SODIUM CHLORIDE 0.9 % IV SOLP
1000 mL | Freq: Once | INTRAVENOUS | 0 refills | Status: CP
Start: 2017-06-05 — End: ?
  Administered 2017-06-05: 18:00:00 1000 mL via INTRAVENOUS

## 2017-06-05 MED ORDER — HEPARIN, PORCINE (PF) 100 UNIT/ML IV SYRG
500 [IU] | Freq: Once | 0 refills | Status: CP
Start: 2017-06-05 — End: ?

## 2017-06-07 ENCOUNTER — Encounter: Admit: 2017-06-07 | Discharge: 2017-06-07 | Payer: Private Health Insurance - Indemnity

## 2017-06-07 DIAGNOSIS — Z0389 Encounter for observation for other suspected diseases and conditions ruled out: ICD-10-CM

## 2017-06-07 DIAGNOSIS — N904 Leukoplakia of vulva: ICD-10-CM

## 2017-06-07 DIAGNOSIS — I519 Heart disease, unspecified: ICD-10-CM

## 2017-06-07 DIAGNOSIS — K219 Gastro-esophageal reflux disease without esophagitis: ICD-10-CM

## 2017-06-07 DIAGNOSIS — Z171 Estrogen receptor negative status [ER-]: ICD-10-CM

## 2017-06-07 DIAGNOSIS — D693 Immune thrombocytopenic purpura: ICD-10-CM

## 2017-06-07 DIAGNOSIS — C50919 Malignant neoplasm of unspecified site of unspecified female breast: ICD-10-CM

## 2017-06-07 DIAGNOSIS — D72819 Decreased white blood cell count, unspecified: ICD-10-CM

## 2017-06-07 DIAGNOSIS — Z87891 Personal history of nicotine dependence: ICD-10-CM

## 2017-06-07 DIAGNOSIS — I10 Essential (primary) hypertension: ICD-10-CM

## 2017-06-07 DIAGNOSIS — C50311 Malignant neoplasm of lower-inner quadrant of right female breast: Principal | ICD-10-CM

## 2017-06-07 DIAGNOSIS — E785 Hyperlipidemia, unspecified: Principal | ICD-10-CM

## 2017-06-08 ENCOUNTER — Encounter: Admit: 2017-06-08 | Discharge: 2017-06-08 | Payer: Private Health Insurance - Indemnity

## 2017-06-15 ENCOUNTER — Encounter: Admit: 2017-06-15 | Discharge: 2017-06-15 | Payer: Private Health Insurance - Indemnity

## 2017-06-20 ENCOUNTER — Encounter: Admit: 2017-06-20 | Discharge: 2017-06-20 | Payer: Private Health Insurance - Indemnity

## 2017-06-20 ENCOUNTER — Encounter: Admit: 2017-06-20 | Discharge: 2017-06-21 | Payer: Private Health Insurance - Indemnity

## 2017-06-20 DIAGNOSIS — C50311 Malignant neoplasm of lower-inner quadrant of right female breast: Principal | ICD-10-CM

## 2017-06-20 DIAGNOSIS — Z171 Estrogen receptor negative status [ER-]: ICD-10-CM

## 2017-06-20 MED ORDER — SODIUM CHLORIDE 0.9 % IJ SOLN
50 mL | Freq: Once | INTRAVENOUS | 0 refills | Status: CP
Start: 2017-06-20 — End: ?
  Administered 2017-06-20: 20:00:00 50 mL via INTRAVENOUS

## 2017-06-20 MED ORDER — GADOBENATE DIMEGLUMINE 529 MG/ML (0.1MMOL/0.2ML) IV SOLN
19 mL | Freq: Once | INTRAVENOUS | 0 refills | Status: CP
Start: 2017-06-20 — End: ?
  Administered 2017-06-20: 20:00:00 19 mL via INTRAVENOUS

## 2017-06-22 ENCOUNTER — Encounter: Admit: 2017-06-22 | Discharge: 2017-06-22 | Payer: Private Health Insurance - Indemnity

## 2017-06-22 DIAGNOSIS — D6959 Other secondary thrombocytopenia: ICD-10-CM

## 2017-06-22 DIAGNOSIS — R12 Heartburn: ICD-10-CM

## 2017-06-22 DIAGNOSIS — E785 Hyperlipidemia, unspecified: Principal | ICD-10-CM

## 2017-06-22 DIAGNOSIS — I10 Essential (primary) hypertension: ICD-10-CM

## 2017-06-22 DIAGNOSIS — Z171 Estrogen receptor negative status [ER-]: ICD-10-CM

## 2017-06-22 DIAGNOSIS — N904 Leukoplakia of vulva: ICD-10-CM

## 2017-06-22 DIAGNOSIS — I519 Heart disease, unspecified: ICD-10-CM

## 2017-06-22 DIAGNOSIS — Z0389 Encounter for observation for other suspected diseases and conditions ruled out: ICD-10-CM

## 2017-06-22 DIAGNOSIS — T451X5A Adverse effect of antineoplastic and immunosuppressive drugs, initial encounter: ICD-10-CM

## 2017-06-22 DIAGNOSIS — C50311 Malignant neoplasm of lower-inner quadrant of right female breast: Principal | ICD-10-CM

## 2017-06-22 DIAGNOSIS — D72819 Decreased white blood cell count, unspecified: ICD-10-CM

## 2017-06-22 DIAGNOSIS — K219 Gastro-esophageal reflux disease without esophagitis: ICD-10-CM

## 2017-06-22 DIAGNOSIS — D6481 Anemia due to antineoplastic chemotherapy: ICD-10-CM

## 2017-06-22 DIAGNOSIS — D693 Immune thrombocytopenic purpura: ICD-10-CM

## 2017-06-22 DIAGNOSIS — C50919 Malignant neoplasm of unspecified site of unspecified female breast: ICD-10-CM

## 2017-06-22 LAB — CBC AND DIFF
Lab: 2.8 M/UL — ABNORMAL LOW (ref 4.0–5.0)
Lab: 28 % — ABNORMAL LOW (ref 36–45)
Lab: 6.1 K/UL (ref 4.5–11.0)

## 2017-06-23 ENCOUNTER — Encounter: Admit: 2017-06-23 | Discharge: 2017-06-23 | Payer: Private Health Insurance - Indemnity

## 2017-06-23 LAB — COMPREHENSIVE METABOLIC PANEL
Lab: 137 MMOL/L (ref 137–147)
Lab: 4.2 MMOL/L (ref 3.5–5.1)

## 2017-06-27 ENCOUNTER — Encounter: Admit: 2017-06-27 | Discharge: 2017-06-27 | Payer: Private Health Insurance - Indemnity

## 2017-06-27 DIAGNOSIS — Z9289 Personal history of other medical treatment: ICD-10-CM

## 2017-06-27 DIAGNOSIS — R1310 Dysphagia, unspecified: ICD-10-CM

## 2017-06-27 DIAGNOSIS — Z9221 Personal history of antineoplastic chemotherapy: ICD-10-CM

## 2017-06-27 DIAGNOSIS — Z0389 Encounter for observation for other suspected diseases and conditions ruled out: ICD-10-CM

## 2017-06-27 DIAGNOSIS — I10 Essential (primary) hypertension: ICD-10-CM

## 2017-06-27 DIAGNOSIS — K219 Gastro-esophageal reflux disease without esophagitis: ICD-10-CM

## 2017-06-27 DIAGNOSIS — D693 Immune thrombocytopenic purpura: ICD-10-CM

## 2017-06-27 DIAGNOSIS — N904 Leukoplakia of vulva: ICD-10-CM

## 2017-06-27 DIAGNOSIS — C50919 Malignant neoplasm of unspecified site of unspecified female breast: ICD-10-CM

## 2017-06-27 DIAGNOSIS — D72819 Decreased white blood cell count, unspecified: ICD-10-CM

## 2017-06-27 DIAGNOSIS — E785 Hyperlipidemia, unspecified: Principal | ICD-10-CM

## 2017-06-28 ENCOUNTER — Encounter: Admit: 2017-06-28 | Discharge: 2017-06-28 | Payer: Private Health Insurance - Indemnity

## 2017-06-28 ENCOUNTER — Ambulatory Visit: Admit: 2017-06-28 | Discharge: 2017-06-29 | Payer: Private Health Insurance - Indemnity

## 2017-06-28 DIAGNOSIS — D72819 Decreased white blood cell count, unspecified: ICD-10-CM

## 2017-06-28 DIAGNOSIS — C50919 Malignant neoplasm of unspecified site of unspecified female breast: ICD-10-CM

## 2017-06-28 DIAGNOSIS — Z9221 Personal history of antineoplastic chemotherapy: ICD-10-CM

## 2017-06-28 DIAGNOSIS — Z9289 Personal history of other medical treatment: ICD-10-CM

## 2017-06-28 DIAGNOSIS — E785 Hyperlipidemia, unspecified: Principal | ICD-10-CM

## 2017-06-28 DIAGNOSIS — D693 Immune thrombocytopenic purpura: ICD-10-CM

## 2017-06-28 DIAGNOSIS — K219 Gastro-esophageal reflux disease without esophagitis: ICD-10-CM

## 2017-06-28 DIAGNOSIS — R1310 Dysphagia, unspecified: ICD-10-CM

## 2017-06-28 DIAGNOSIS — Z0389 Encounter for observation for other suspected diseases and conditions ruled out: ICD-10-CM

## 2017-06-28 DIAGNOSIS — I10 Essential (primary) hypertension: ICD-10-CM

## 2017-06-28 DIAGNOSIS — N904 Leukoplakia of vulva: ICD-10-CM

## 2017-06-29 ENCOUNTER — Ambulatory Visit: Admit: 2017-06-29 | Discharge: 2017-06-29 | Payer: Private Health Insurance - Indemnity

## 2017-06-29 ENCOUNTER — Encounter: Admit: 2017-06-29 | Discharge: 2017-06-29 | Payer: Private Health Insurance - Indemnity

## 2017-06-29 DIAGNOSIS — R1310 Dysphagia, unspecified: ICD-10-CM

## 2017-06-29 DIAGNOSIS — Z9289 Personal history of other medical treatment: ICD-10-CM

## 2017-06-29 DIAGNOSIS — C50919 Malignant neoplasm of unspecified site of unspecified female breast: ICD-10-CM

## 2017-06-29 DIAGNOSIS — I10 Essential (primary) hypertension: ICD-10-CM

## 2017-06-29 DIAGNOSIS — R159 Full incontinence of feces: ICD-10-CM

## 2017-06-29 DIAGNOSIS — Z9221 Personal history of antineoplastic chemotherapy: ICD-10-CM

## 2017-06-29 DIAGNOSIS — R197 Diarrhea, unspecified: ICD-10-CM

## 2017-06-29 DIAGNOSIS — Z01818 Encounter for other preprocedural examination: Principal | ICD-10-CM

## 2017-06-29 DIAGNOSIS — K219 Gastro-esophageal reflux disease without esophagitis: ICD-10-CM

## 2017-06-29 DIAGNOSIS — D693 Immune thrombocytopenic purpura: ICD-10-CM

## 2017-06-29 DIAGNOSIS — Z0389 Encounter for observation for other suspected diseases and conditions ruled out: ICD-10-CM

## 2017-06-29 DIAGNOSIS — D72819 Decreased white blood cell count, unspecified: ICD-10-CM

## 2017-06-29 DIAGNOSIS — E785 Hyperlipidemia, unspecified: Principal | ICD-10-CM

## 2017-06-29 DIAGNOSIS — N904 Leukoplakia of vulva: ICD-10-CM

## 2017-06-30 ENCOUNTER — Encounter: Admit: 2017-06-30 | Discharge: 2017-06-30 | Payer: Private Health Insurance - Indemnity

## 2017-06-30 DIAGNOSIS — K219 Gastro-esophageal reflux disease without esophagitis: ICD-10-CM

## 2017-06-30 DIAGNOSIS — R1310 Dysphagia, unspecified: Principal | ICD-10-CM

## 2017-06-30 MED ORDER — OMEPRAZOLE 40 MG PO CPDR
40 mg | ORAL_CAPSULE | Freq: Two times a day (BID) | ORAL | 11 refills | Status: AC
Start: 2017-06-30 — End: 2017-12-11

## 2017-07-03 ENCOUNTER — Encounter: Admit: 2017-07-03 | Discharge: 2017-07-03 | Payer: Private Health Insurance - Indemnity

## 2017-07-04 ENCOUNTER — Encounter: Admit: 2017-07-04 | Discharge: 2017-07-04 | Payer: Private Health Insurance - Indemnity

## 2017-07-04 ENCOUNTER — Encounter: Admit: 2017-07-04 | Discharge: 2017-07-05 | Payer: Private Health Insurance - Indemnity

## 2017-07-04 DIAGNOSIS — Z171 Estrogen receptor negative status [ER-]: ICD-10-CM

## 2017-07-04 DIAGNOSIS — C50311 Malignant neoplasm of lower-inner quadrant of right female breast: Principal | ICD-10-CM

## 2017-07-04 MED ORDER — LIDOCAINE 1% (BUFFERED) SYRINGE (BREAST CENTER)
2.5 mL | Freq: Once | INTRAMUSCULAR | 0 refills | Status: CP
Start: 2017-07-04 — End: ?

## 2017-07-06 ENCOUNTER — Ambulatory Visit: Admit: 2017-06-28 | Discharge: 2017-06-28 | Payer: Private Health Insurance - Indemnity

## 2017-07-06 ENCOUNTER — Encounter: Admit: 2017-07-06 | Discharge: 2017-07-06 | Payer: Private Health Insurance - Indemnity

## 2017-07-06 ENCOUNTER — Ambulatory Visit: Admit: 2017-07-06 | Discharge: 2017-07-06 | Payer: Private Health Insurance - Indemnity

## 2017-07-06 DIAGNOSIS — E785 Hyperlipidemia, unspecified: ICD-10-CM

## 2017-07-06 DIAGNOSIS — I10 Essential (primary) hypertension: ICD-10-CM

## 2017-07-06 DIAGNOSIS — K219 Gastro-esophageal reflux disease without esophagitis: ICD-10-CM

## 2017-07-06 DIAGNOSIS — Z9289 Personal history of other medical treatment: ICD-10-CM

## 2017-07-06 DIAGNOSIS — Z0389 Encounter for observation for other suspected diseases and conditions ruled out: ICD-10-CM

## 2017-07-06 DIAGNOSIS — Z803 Family history of malignant neoplasm of breast: ICD-10-CM

## 2017-07-06 DIAGNOSIS — Z87891 Personal history of nicotine dependence: ICD-10-CM

## 2017-07-06 DIAGNOSIS — R1310 Dysphagia, unspecified: ICD-10-CM

## 2017-07-06 DIAGNOSIS — D693 Immune thrombocytopenic purpura: ICD-10-CM

## 2017-07-06 DIAGNOSIS — D72818 Other decreased white blood cell count: ICD-10-CM

## 2017-07-06 DIAGNOSIS — C50311 Malignant neoplasm of lower-inner quadrant of right female breast: Principal | ICD-10-CM

## 2017-07-06 DIAGNOSIS — Z171 Estrogen receptor negative status [ER-]: ICD-10-CM

## 2017-07-06 DIAGNOSIS — D72819 Decreased white blood cell count, unspecified: ICD-10-CM

## 2017-07-06 DIAGNOSIS — Z90710 Acquired absence of both cervix and uterus: ICD-10-CM

## 2017-07-06 DIAGNOSIS — Z9221 Personal history of antineoplastic chemotherapy: ICD-10-CM

## 2017-07-06 DIAGNOSIS — C50919 Malignant neoplasm of unspecified site of unspecified female breast: ICD-10-CM

## 2017-07-06 DIAGNOSIS — N904 Leukoplakia of vulva: ICD-10-CM

## 2017-07-06 MED ORDER — ONDANSETRON HCL (PF) 4 MG/2 ML IJ SOLN
4 mg | Freq: Once | INTRAVENOUS | 0 refills | Status: DC | PRN
Start: 2017-07-06 — End: 2017-07-06

## 2017-07-06 MED ORDER — ACETAMINOPHEN 500 MG PO TAB
1000 mg | Freq: Once | ORAL | 0 refills | Status: CP
Start: 2017-07-06 — End: ?
  Administered 2017-07-06: 15:00:00 1000 mg via ORAL

## 2017-07-06 MED ORDER — OXYCODONE 5 MG PO TAB
5-10 mg | Freq: Once | ORAL | 0 refills | Status: DC | PRN
Start: 2017-07-06 — End: 2017-07-06

## 2017-07-06 MED ORDER — SENNOSIDES-DOCUSATE SODIUM 8.6-50 MG PO TAB
1 | ORAL_TABLET | Freq: Two times a day (BID) | ORAL | 1 refills | Status: AC
Start: 2017-07-06 — End: 2017-07-17

## 2017-07-06 MED ORDER — CEFAZOLIN INJ 1GM IVP
2 g | Freq: Once | INTRAVENOUS | 0 refills | Status: DC
Start: 2017-07-06 — End: 2017-07-06

## 2017-07-06 MED ORDER — CELECOXIB 200 MG PO CAP
200 mg | Freq: Once | ORAL | 0 refills | Status: CP
Start: 2017-07-06 — End: ?
  Administered 2017-07-06: 15:00:00 200 mg via ORAL

## 2017-07-06 MED ORDER — PROPOFOL 10 MG/ML IV EMUL (INFUSION)(AM)(OR)
0 refills | Status: DC
Start: 2017-07-06 — End: 2017-07-06
  Administered 2017-07-06: 17:00:00 200 ug/kg/min via INTRAVENOUS

## 2017-07-06 MED ORDER — ISOSULFAN BLUE 1 % SC SOLN
0 refills | Status: DC
Start: 2017-07-06 — End: 2017-07-06
  Administered 2017-07-06: 17:00:00 2.5 mL via INTRAMUSCULAR

## 2017-07-06 MED ORDER — GABAPENTIN 300 MG PO CAP
300 mg | Freq: Once | ORAL | 0 refills | Status: CP
Start: 2017-07-06 — End: ?
  Administered 2017-07-06: 15:00:00 300 mg via ORAL

## 2017-07-06 MED ORDER — RP DX TC-99M SULF COLL MCI
.5 | Freq: Once | INTRAVENOUS | 0 refills | Status: CP
Start: 2017-07-06 — End: ?
  Administered 2017-07-06: 12:00:00 0.5 via INTRAVENOUS

## 2017-07-06 MED ORDER — FENTANYL CITRATE (PF) 50 MCG/ML IJ SOLN
50 ug | INTRAVENOUS | 0 refills | Status: DC | PRN
Start: 2017-07-06 — End: 2017-07-06
  Administered 2017-07-06: 19:00:00 50 ug via INTRAVENOUS

## 2017-07-06 MED ORDER — ISOSULFAN BLUE 1 % SC SOLN
25 mg | Freq: Once | INTRAMUSCULAR | 0 refills | Status: DC
Start: 2017-07-06 — End: 2017-07-06

## 2017-07-06 MED ORDER — DEXAMETHASONE SODIUM PHOSPHATE 4 MG/ML IJ SOLN
INTRAVENOUS | 0 refills | Status: DC
Start: 2017-07-06 — End: 2017-07-06
  Administered 2017-07-06: 17:00:00 10 mg via INTRAVENOUS

## 2017-07-06 MED ORDER — FENTANYL CITRATE (PF) 50 MCG/ML IJ SOLN
25 ug | INTRAVENOUS | 0 refills | Status: DC | PRN
Start: 2017-07-06 — End: 2017-07-06

## 2017-07-06 MED ORDER — ONDANSETRON HCL (PF) 4 MG/2 ML IJ SOLN
INTRAVENOUS | 0 refills | Status: DC
Start: 2017-07-06 — End: 2017-07-06
  Administered 2017-07-06: 17:00:00 4 mg via INTRAVENOUS

## 2017-07-06 MED ORDER — FAMOTIDINE (PF) 20 MG/2 ML IV SOLN
0 refills | Status: DC
Start: 2017-07-06 — End: 2017-07-06
  Administered 2017-07-06: 17:00:00 20 mg via INTRAVENOUS

## 2017-07-06 MED ORDER — LACTATED RINGERS IV SOLP
0 refills | Status: DC
Start: 2017-07-06 — End: 2017-07-06
  Administered 2017-07-06: 17:00:00 via INTRAVENOUS

## 2017-07-06 MED ORDER — HYDROCODONE-ACETAMINOPHEN 5-325 MG PO TAB
1-2 | ORAL_TABLET | ORAL | 0 refills | 15.00000 days | Status: AC | PRN
Start: 2017-07-06 — End: 2017-07-17

## 2017-07-06 MED ORDER — PROPOFOL INJ 10 MG/ML IV VIAL
0 refills | Status: DC
Start: 2017-07-06 — End: 2017-07-06
  Administered 2017-07-06: 17:00:00 200 mg via INTRAVENOUS

## 2017-07-06 MED ORDER — MEPERIDINE (PF) 25 MG/ML IJ SYRG
12.5 mg | INTRAVENOUS | 0 refills | Status: DC | PRN
Start: 2017-07-06 — End: 2017-07-06

## 2017-07-06 MED ORDER — PROMETHAZINE 25 MG/ML IJ SOLN
6.25 mg | INTRAVENOUS | 0 refills | Status: DC | PRN
Start: 2017-07-06 — End: 2017-07-06

## 2017-07-06 MED ORDER — FENTANYL CITRATE (PF) 50 MCG/ML IJ SOLN
0 refills | Status: DC
Start: 2017-07-06 — End: 2017-07-06
  Administered 2017-07-06: 17:00:00 50 ug via INTRAVENOUS
  Administered 2017-07-06 (×2): 25 ug via INTRAVENOUS

## 2017-07-06 MED ORDER — LIDOCAINE (PF) 200 MG/10 ML (2 %) IJ SYRG
0 refills | Status: DC
Start: 2017-07-06 — End: 2017-07-06
  Administered 2017-07-06: 17:00:00 80 mg via INTRAVENOUS

## 2017-07-06 MED ORDER — MIDAZOLAM 1 MG/ML IJ SOLN
INTRAVENOUS | 0 refills | Status: DC
Start: 2017-07-06 — End: 2017-07-06
  Administered 2017-07-06: 17:00:00 2 mg via INTRAVENOUS

## 2017-07-06 MED ORDER — DIPHENHYDRAMINE HCL 50 MG/ML IJ SOLN
0 refills | Status: DC
Start: 2017-07-06 — End: 2017-07-06
  Administered 2017-07-06: 17:00:00 12.5 mg via INTRAVENOUS

## 2017-07-06 MED ORDER — LACTATED RINGERS IV SOLP
INTRAVENOUS | 0 refills | Status: DC
Start: 2017-07-06 — End: 2017-07-06

## 2017-07-06 MED ORDER — LIDOCAINE (PF) 10 MG/ML (1 %) IJ SOLN
.1-2 mL | INTRAMUSCULAR | 0 refills | Status: DC | PRN
Start: 2017-07-06 — End: 2017-07-06

## 2017-07-06 MED ORDER — CEFAZOLIN 1 GRAM IJ SOLR
0 refills | Status: DC
Start: 2017-07-06 — End: 2017-07-06
  Administered 2017-07-06: 17:00:00 2 g via INTRAVENOUS

## 2017-07-06 MED ORDER — FENTANYL CITRATE (PF) 50 MCG/ML IJ SOLN
50 ug | INTRAVENOUS | 0 refills | Status: DC | PRN
Start: 2017-07-06 — End: 2017-07-06

## 2017-07-06 MED ORDER — HALOPERIDOL LACTATE 5 MG/ML IJ SOLN
1 mg | Freq: Once | INTRAVENOUS | 0 refills | Status: DC | PRN
Start: 2017-07-06 — End: 2017-07-06

## 2017-07-08 ENCOUNTER — Encounter: Admit: 2017-07-08 | Discharge: 2017-07-08 | Payer: Private Health Insurance - Indemnity

## 2017-07-08 DIAGNOSIS — K219 Gastro-esophageal reflux disease without esophagitis: ICD-10-CM

## 2017-07-08 DIAGNOSIS — N904 Leukoplakia of vulva: ICD-10-CM

## 2017-07-08 DIAGNOSIS — R1310 Dysphagia, unspecified: ICD-10-CM

## 2017-07-08 DIAGNOSIS — D72819 Decreased white blood cell count, unspecified: ICD-10-CM

## 2017-07-08 DIAGNOSIS — Z9289 Personal history of other medical treatment: ICD-10-CM

## 2017-07-08 DIAGNOSIS — I10 Essential (primary) hypertension: ICD-10-CM

## 2017-07-08 DIAGNOSIS — Z9221 Personal history of antineoplastic chemotherapy: ICD-10-CM

## 2017-07-08 DIAGNOSIS — E785 Hyperlipidemia, unspecified: Principal | ICD-10-CM

## 2017-07-08 DIAGNOSIS — D693 Immune thrombocytopenic purpura: ICD-10-CM

## 2017-07-08 DIAGNOSIS — C50919 Malignant neoplasm of unspecified site of unspecified female breast: ICD-10-CM

## 2017-07-08 DIAGNOSIS — Z0389 Encounter for observation for other suspected diseases and conditions ruled out: ICD-10-CM

## 2017-07-17 ENCOUNTER — Ambulatory Visit: Admit: 2017-07-17 | Discharge: 2017-07-18 | Payer: Private Health Insurance - Indemnity

## 2017-07-17 ENCOUNTER — Encounter: Admit: 2017-07-17 | Discharge: 2017-07-17 | Payer: Private Health Insurance - Indemnity

## 2017-07-17 DIAGNOSIS — E7800 Pure hypercholesterolemia, unspecified: ICD-10-CM

## 2017-07-17 DIAGNOSIS — I10 Essential (primary) hypertension: Principal | ICD-10-CM

## 2017-07-17 DIAGNOSIS — Z0389 Encounter for observation for other suspected diseases and conditions ruled out: ICD-10-CM

## 2017-07-17 DIAGNOSIS — Z9221 Personal history of antineoplastic chemotherapy: ICD-10-CM

## 2017-07-17 DIAGNOSIS — R1310 Dysphagia, unspecified: ICD-10-CM

## 2017-07-17 DIAGNOSIS — R002 Palpitations: ICD-10-CM

## 2017-07-17 DIAGNOSIS — C50919 Malignant neoplasm of unspecified site of unspecified female breast: ICD-10-CM

## 2017-07-17 DIAGNOSIS — D693 Immune thrombocytopenic purpura: ICD-10-CM

## 2017-07-17 DIAGNOSIS — N904 Leukoplakia of vulva: ICD-10-CM

## 2017-07-17 DIAGNOSIS — D72819 Decreased white blood cell count, unspecified: ICD-10-CM

## 2017-07-17 DIAGNOSIS — Z9289 Personal history of other medical treatment: ICD-10-CM

## 2017-07-17 DIAGNOSIS — R931 Abnormal findings on diagnostic imaging of heart and coronary circulation: ICD-10-CM

## 2017-07-17 DIAGNOSIS — K219 Gastro-esophageal reflux disease without esophagitis: ICD-10-CM

## 2017-07-17 DIAGNOSIS — E785 Hyperlipidemia, unspecified: Principal | ICD-10-CM

## 2017-07-17 DIAGNOSIS — I2510 Atherosclerotic heart disease of native coronary artery without angina pectoris: Principal | ICD-10-CM

## 2017-07-19 ENCOUNTER — Encounter: Admit: 2017-07-19 | Discharge: 2017-07-19 | Payer: Private Health Insurance - Indemnity

## 2017-07-19 DIAGNOSIS — R1310 Dysphagia, unspecified: ICD-10-CM

## 2017-07-19 DIAGNOSIS — Z9289 Personal history of other medical treatment: ICD-10-CM

## 2017-07-19 DIAGNOSIS — C50311 Malignant neoplasm of lower-inner quadrant of right female breast: Principal | ICD-10-CM

## 2017-07-19 DIAGNOSIS — K219 Gastro-esophageal reflux disease without esophagitis: ICD-10-CM

## 2017-07-19 DIAGNOSIS — D72819 Decreased white blood cell count, unspecified: ICD-10-CM

## 2017-07-19 DIAGNOSIS — Z9221 Personal history of antineoplastic chemotherapy: ICD-10-CM

## 2017-07-19 DIAGNOSIS — D693 Immune thrombocytopenic purpura: ICD-10-CM

## 2017-07-19 DIAGNOSIS — Z0389 Encounter for observation for other suspected diseases and conditions ruled out: ICD-10-CM

## 2017-07-19 DIAGNOSIS — I10 Essential (primary) hypertension: ICD-10-CM

## 2017-07-19 DIAGNOSIS — Z171 Estrogen receptor negative status [ER-]: ICD-10-CM

## 2017-07-19 DIAGNOSIS — C50919 Malignant neoplasm of unspecified site of unspecified female breast: ICD-10-CM

## 2017-07-19 DIAGNOSIS — N904 Leukoplakia of vulva: ICD-10-CM

## 2017-07-19 DIAGNOSIS — E785 Hyperlipidemia, unspecified: Principal | ICD-10-CM

## 2017-07-20 ENCOUNTER — Encounter: Admit: 2017-07-20 | Discharge: 2017-07-21 | Payer: Private Health Insurance - Indemnity

## 2017-07-20 ENCOUNTER — Encounter: Admit: 2017-07-20 | Discharge: 2017-07-20 | Payer: Private Health Insurance - Indemnity

## 2017-07-20 DIAGNOSIS — C50919 Malignant neoplasm of unspecified site of unspecified female breast: ICD-10-CM

## 2017-07-20 DIAGNOSIS — D6959 Other secondary thrombocytopenia: Principal | ICD-10-CM

## 2017-07-20 DIAGNOSIS — K219 Gastro-esophageal reflux disease without esophagitis: ICD-10-CM

## 2017-07-20 DIAGNOSIS — Z171 Estrogen receptor negative status [ER-]: ICD-10-CM

## 2017-07-20 DIAGNOSIS — T451X5A Adverse effect of antineoplastic and immunosuppressive drugs, initial encounter: ICD-10-CM

## 2017-07-20 DIAGNOSIS — Z9221 Personal history of antineoplastic chemotherapy: ICD-10-CM

## 2017-07-20 DIAGNOSIS — Z9289 Personal history of other medical treatment: ICD-10-CM

## 2017-07-20 DIAGNOSIS — C50311 Malignant neoplasm of lower-inner quadrant of right female breast: Principal | ICD-10-CM

## 2017-07-20 DIAGNOSIS — D6481 Anemia due to antineoplastic chemotherapy: ICD-10-CM

## 2017-07-20 DIAGNOSIS — D72819 Decreased white blood cell count, unspecified: ICD-10-CM

## 2017-07-20 DIAGNOSIS — R1310 Dysphagia, unspecified: ICD-10-CM

## 2017-07-20 DIAGNOSIS — N904 Leukoplakia of vulva: ICD-10-CM

## 2017-07-20 DIAGNOSIS — I10 Essential (primary) hypertension: ICD-10-CM

## 2017-07-20 DIAGNOSIS — D693 Immune thrombocytopenic purpura: ICD-10-CM

## 2017-07-20 DIAGNOSIS — Z0389 Encounter for observation for other suspected diseases and conditions ruled out: ICD-10-CM

## 2017-07-20 DIAGNOSIS — E785 Hyperlipidemia, unspecified: Principal | ICD-10-CM

## 2017-07-20 LAB — CBC AND DIFF
Lab: 10 g/dL — ABNORMAL LOW (ref 12.0–15.0)
Lab: 2.9 M/UL — ABNORMAL LOW (ref 4.0–5.0)
Lab: 29 % — ABNORMAL LOW (ref 36–45)
Lab: 7 10*3/uL (ref 4.5–11.0)

## 2017-07-20 MED ORDER — HEPARIN, PORCINE (PF) 100 UNIT/ML IV SYRG
500 [IU] | Freq: Once | 0 refills | Status: CP
Start: 2017-07-20 — End: ?

## 2017-07-21 ENCOUNTER — Encounter: Admit: 2017-07-21 | Discharge: 2017-07-21 | Payer: Private Health Insurance - Indemnity

## 2017-07-21 DIAGNOSIS — C50311 Malignant neoplasm of lower-inner quadrant of right female breast: Principal | ICD-10-CM

## 2017-07-27 ENCOUNTER — Encounter: Admit: 2017-07-27 | Discharge: 2017-07-27 | Payer: Private Health Insurance - Indemnity

## 2017-08-29 ENCOUNTER — Ambulatory Visit: Admit: 2017-08-29 | Discharge: 2017-09-13 | Payer: Private Health Insurance - Indemnity

## 2017-09-04 ENCOUNTER — Encounter: Admit: 2017-09-04 | Discharge: 2017-09-04 | Payer: Private Health Insurance - Indemnity

## 2017-09-07 ENCOUNTER — Encounter: Admit: 2017-09-07 | Discharge: 2017-09-07 | Payer: Private Health Insurance - Indemnity

## 2017-09-08 ENCOUNTER — Encounter: Admit: 2017-09-08 | Discharge: 2017-09-08 | Payer: Private Health Insurance - Indemnity

## 2017-09-11 ENCOUNTER — Encounter: Admit: 2017-09-11 | Discharge: 2017-09-11 | Payer: Private Health Insurance - Indemnity

## 2017-09-12 ENCOUNTER — Encounter: Admit: 2017-09-12 | Discharge: 2017-09-12 | Payer: Private Health Insurance - Indemnity

## 2017-09-13 ENCOUNTER — Encounter: Admit: 2017-09-13 | Discharge: 2017-09-13 | Payer: Private Health Insurance - Indemnity

## 2017-09-13 DIAGNOSIS — C50311 Malignant neoplasm of lower-inner quadrant of right female breast: Principal | ICD-10-CM

## 2017-09-14 ENCOUNTER — Ambulatory Visit: Admit: 2017-09-14 | Discharge: 2017-09-28 | Payer: Private Health Insurance - Indemnity

## 2017-09-14 ENCOUNTER — Encounter: Admit: 2017-09-14 | Discharge: 2017-09-14 | Payer: Private Health Insurance - Indemnity

## 2017-09-14 DIAGNOSIS — R300 Dysuria: Principal | ICD-10-CM

## 2017-09-14 DIAGNOSIS — N390 Urinary tract infection, site not specified: Principal | ICD-10-CM

## 2017-09-14 LAB — URINALYSIS DIPSTICK REFLEX TO CULTURE
Lab: 1 % — ABNORMAL HIGH (ref 1.003–1.035)
Lab: 6 % — ABNORMAL LOW (ref >60)
Lab: NEGATIVE 10*3/uL (ref 0–0.45)
Lab: NEGATIVE 10*3/uL (ref 0–0.80)
Lab: NEGATIVE 10*3/uL (ref 1.0–4.8)
Lab: NEGATIVE
Lab: NEGATIVE

## 2017-09-14 MED ORDER — SODIUM CHLORIDE 0.9 % IV SOLP
1000 mL | Freq: Once | INTRAVENOUS | 0 refills | Status: CN
Start: 2017-09-14 — End: ?

## 2017-09-14 MED ORDER — CIPROFLOXACIN HCL 500 MG PO TAB
500 mg | ORAL_TABLET | Freq: Two times a day (BID) | ORAL | 0 refills | 10.00000 days | Status: AC
Start: 2017-09-14 — End: 2017-09-25

## 2017-09-14 MED ORDER — SODIUM CHLORIDE 0.9 % IV SOLP
1000 mL | Freq: Once | INTRAVENOUS | 0 refills | Status: CP
Start: 2017-09-14 — End: ?
  Administered 2017-09-14: 17:00:00 1000 mL via INTRAVENOUS

## 2017-09-15 ENCOUNTER — Encounter: Admit: 2017-09-15 | Discharge: 2017-09-15 | Payer: Private Health Insurance - Indemnity

## 2017-09-16 LAB — CULTURE-URINE W/SENSITIVITY: Lab: <10

## 2017-09-18 ENCOUNTER — Encounter: Admit: 2017-09-18 | Discharge: 2017-09-18 | Payer: Private Health Insurance - Indemnity

## 2017-09-18 DIAGNOSIS — R1310 Dysphagia, unspecified: ICD-10-CM

## 2017-09-18 DIAGNOSIS — I10 Essential (primary) hypertension: ICD-10-CM

## 2017-09-18 DIAGNOSIS — Z9221 Personal history of antineoplastic chemotherapy: ICD-10-CM

## 2017-09-18 DIAGNOSIS — C50919 Malignant neoplasm of unspecified site of unspecified female breast: ICD-10-CM

## 2017-09-18 DIAGNOSIS — R300 Dysuria: Secondary | ICD-10-CM

## 2017-09-18 DIAGNOSIS — E785 Hyperlipidemia, unspecified: Principal | ICD-10-CM

## 2017-09-18 DIAGNOSIS — Z9289 Personal history of other medical treatment: ICD-10-CM

## 2017-09-18 DIAGNOSIS — K219 Gastro-esophageal reflux disease without esophagitis: ICD-10-CM

## 2017-09-18 DIAGNOSIS — D693 Immune thrombocytopenic purpura: ICD-10-CM

## 2017-09-18 DIAGNOSIS — Z0389 Encounter for observation for other suspected diseases and conditions ruled out: ICD-10-CM

## 2017-09-18 DIAGNOSIS — D72819 Decreased white blood cell count, unspecified: ICD-10-CM

## 2017-09-18 DIAGNOSIS — N904 Leukoplakia of vulva: ICD-10-CM

## 2017-09-19 ENCOUNTER — Encounter: Admit: 2017-09-19 | Discharge: 2017-09-19 | Payer: Private Health Insurance - Indemnity

## 2017-09-20 ENCOUNTER — Encounter: Admit: 2017-09-20 | Discharge: 2017-09-20 | Payer: Private Health Insurance - Indemnity

## 2017-09-21 ENCOUNTER — Encounter: Admit: 2017-09-21 | Discharge: 2017-09-21 | Payer: Private Health Insurance - Indemnity

## 2017-09-22 ENCOUNTER — Encounter: Admit: 2017-09-22 | Discharge: 2017-09-22 | Payer: Private Health Insurance - Indemnity

## 2017-09-25 ENCOUNTER — Encounter: Admit: 2017-09-25 | Discharge: 2017-09-25 | Payer: Private Health Insurance - Indemnity

## 2017-09-25 DIAGNOSIS — C50919 Malignant neoplasm of unspecified site of unspecified female breast: ICD-10-CM

## 2017-09-25 DIAGNOSIS — I10 Essential (primary) hypertension: ICD-10-CM

## 2017-09-25 DIAGNOSIS — E785 Hyperlipidemia, unspecified: Principal | ICD-10-CM

## 2017-09-25 DIAGNOSIS — Z9289 Personal history of other medical treatment: ICD-10-CM

## 2017-09-25 DIAGNOSIS — K219 Gastro-esophageal reflux disease without esophagitis: ICD-10-CM

## 2017-09-25 DIAGNOSIS — R1310 Dysphagia, unspecified: ICD-10-CM

## 2017-09-25 DIAGNOSIS — D693 Immune thrombocytopenic purpura: ICD-10-CM

## 2017-09-25 DIAGNOSIS — Z9221 Personal history of antineoplastic chemotherapy: ICD-10-CM

## 2017-09-25 DIAGNOSIS — Z0389 Encounter for observation for other suspected diseases and conditions ruled out: ICD-10-CM

## 2017-09-25 DIAGNOSIS — D72819 Decreased white blood cell count, unspecified: ICD-10-CM

## 2017-09-25 DIAGNOSIS — N904 Leukoplakia of vulva: ICD-10-CM

## 2017-09-26 ENCOUNTER — Encounter: Admit: 2017-09-26 | Discharge: 2017-09-26 | Payer: Private Health Insurance - Indemnity

## 2017-09-26 ENCOUNTER — Ambulatory Visit: Admit: 2017-09-26 | Discharge: 2017-09-27 | Payer: Private Health Insurance - Indemnity

## 2017-09-26 DIAGNOSIS — C50919 Malignant neoplasm of unspecified site of unspecified female breast: ICD-10-CM

## 2017-09-26 DIAGNOSIS — R1310 Dysphagia, unspecified: ICD-10-CM

## 2017-09-26 DIAGNOSIS — D72819 Decreased white blood cell count, unspecified: ICD-10-CM

## 2017-09-26 DIAGNOSIS — I10 Essential (primary) hypertension: ICD-10-CM

## 2017-09-26 DIAGNOSIS — E785 Hyperlipidemia, unspecified: Principal | ICD-10-CM

## 2017-09-26 DIAGNOSIS — K219 Gastro-esophageal reflux disease without esophagitis: ICD-10-CM

## 2017-09-26 DIAGNOSIS — Z9289 Personal history of other medical treatment: ICD-10-CM

## 2017-09-26 DIAGNOSIS — Z0389 Encounter for observation for other suspected diseases and conditions ruled out: ICD-10-CM

## 2017-09-26 DIAGNOSIS — N904 Leukoplakia of vulva: ICD-10-CM

## 2017-09-26 DIAGNOSIS — D693 Immune thrombocytopenic purpura: ICD-10-CM

## 2017-09-26 DIAGNOSIS — Z9221 Personal history of antineoplastic chemotherapy: ICD-10-CM

## 2017-09-26 MED ORDER — ESTRADIOL 0.01 % (0.1 MG/GRAM) VA CREA
11 refills | 43.00000 days | Status: AC
Start: 2017-09-26 — End: 2018-11-13

## 2017-09-27 ENCOUNTER — Encounter: Admit: 2017-09-27 | Discharge: 2017-09-27 | Payer: Private Health Insurance - Indemnity

## 2017-09-27 DIAGNOSIS — Z0389 Encounter for observation for other suspected diseases and conditions ruled out: ICD-10-CM

## 2017-09-27 DIAGNOSIS — R1310 Dysphagia, unspecified: ICD-10-CM

## 2017-09-27 DIAGNOSIS — K219 Gastro-esophageal reflux disease without esophagitis: ICD-10-CM

## 2017-09-27 DIAGNOSIS — Z9221 Personal history of antineoplastic chemotherapy: ICD-10-CM

## 2017-09-27 DIAGNOSIS — N904 Leukoplakia of vulva: ICD-10-CM

## 2017-09-27 DIAGNOSIS — D693 Immune thrombocytopenic purpura: ICD-10-CM

## 2017-09-27 DIAGNOSIS — C50919 Malignant neoplasm of unspecified site of unspecified female breast: ICD-10-CM

## 2017-09-27 DIAGNOSIS — E785 Hyperlipidemia, unspecified: Principal | ICD-10-CM

## 2017-09-27 DIAGNOSIS — Z9289 Personal history of other medical treatment: ICD-10-CM

## 2017-09-27 DIAGNOSIS — I10 Essential (primary) hypertension: ICD-10-CM

## 2017-09-27 DIAGNOSIS — D72819 Decreased white blood cell count, unspecified: ICD-10-CM

## 2017-09-28 ENCOUNTER — Encounter: Admit: 2017-09-28 | Discharge: 2017-09-28 | Payer: Private Health Insurance - Indemnity

## 2017-09-28 ENCOUNTER — Encounter: Admit: 2017-09-14 | Discharge: 2017-09-15 | Payer: Private Health Insurance - Indemnity

## 2017-09-28 DIAGNOSIS — C50311 Malignant neoplasm of lower-inner quadrant of right female breast: Principal | ICD-10-CM

## 2017-09-28 DIAGNOSIS — R300 Dysuria: Secondary | ICD-10-CM

## 2017-09-29 ENCOUNTER — Encounter: Admit: 2017-09-29 | Discharge: 2017-09-29 | Payer: Private Health Insurance - Indemnity

## 2017-10-02 ENCOUNTER — Encounter: Admit: 2017-10-02 | Discharge: 2017-10-02 | Payer: Private Health Insurance - Indemnity

## 2017-10-02 ENCOUNTER — Ambulatory Visit: Admit: 2017-09-29 | Discharge: 2017-10-14 | Payer: Private Health Insurance - Indemnity

## 2017-10-02 DIAGNOSIS — Z9289 Personal history of other medical treatment: ICD-10-CM

## 2017-10-02 DIAGNOSIS — K219 Gastro-esophageal reflux disease without esophagitis: ICD-10-CM

## 2017-10-02 DIAGNOSIS — N904 Leukoplakia of vulva: ICD-10-CM

## 2017-10-02 DIAGNOSIS — I10 Essential (primary) hypertension: ICD-10-CM

## 2017-10-02 DIAGNOSIS — Z0389 Encounter for observation for other suspected diseases and conditions ruled out: ICD-10-CM

## 2017-10-02 DIAGNOSIS — C50919 Malignant neoplasm of unspecified site of unspecified female breast: ICD-10-CM

## 2017-10-02 DIAGNOSIS — E785 Hyperlipidemia, unspecified: Principal | ICD-10-CM

## 2017-10-02 DIAGNOSIS — D693 Immune thrombocytopenic purpura: ICD-10-CM

## 2017-10-02 DIAGNOSIS — R1310 Dysphagia, unspecified: ICD-10-CM

## 2017-10-02 DIAGNOSIS — Z9221 Personal history of antineoplastic chemotherapy: ICD-10-CM

## 2017-10-02 DIAGNOSIS — D72819 Decreased white blood cell count, unspecified: ICD-10-CM

## 2017-10-03 ENCOUNTER — Encounter: Admit: 2017-10-03 | Discharge: 2017-10-03 | Payer: Private Health Insurance - Indemnity

## 2017-10-04 ENCOUNTER — Encounter: Admit: 2017-10-04 | Discharge: 2017-10-04 | Payer: Private Health Insurance - Indemnity

## 2017-10-05 ENCOUNTER — Encounter: Admit: 2017-10-05 | Discharge: 2017-10-05 | Payer: Private Health Insurance - Indemnity

## 2017-10-05 DIAGNOSIS — C50311 Malignant neoplasm of lower-inner quadrant of right female breast: Principal | ICD-10-CM

## 2017-10-13 ENCOUNTER — Encounter: Admit: 2017-10-13 | Discharge: 2017-10-13 | Payer: Private Health Insurance - Indemnity

## 2017-10-14 DIAGNOSIS — C50311 Malignant neoplasm of lower-inner quadrant of right female breast: Principal | ICD-10-CM

## 2017-10-25 ENCOUNTER — Encounter: Admit: 2017-10-25 | Discharge: 2017-10-25 | Payer: Private Health Insurance - Indemnity

## 2017-10-25 DIAGNOSIS — D693 Immune thrombocytopenic purpura: ICD-10-CM

## 2017-10-25 DIAGNOSIS — C50311 Malignant neoplasm of lower-inner quadrant of right female breast: Principal | ICD-10-CM

## 2017-10-25 DIAGNOSIS — T451X5A Adverse effect of antineoplastic and immunosuppressive drugs, initial encounter: ICD-10-CM

## 2017-10-25 DIAGNOSIS — N904 Leukoplakia of vulva: ICD-10-CM

## 2017-10-25 DIAGNOSIS — N390 Urinary tract infection, site not specified: ICD-10-CM

## 2017-10-25 DIAGNOSIS — C50312 Malignant neoplasm of lower-inner quadrant of left female breast: Principal | ICD-10-CM

## 2017-10-25 DIAGNOSIS — Z9221 Personal history of antineoplastic chemotherapy: ICD-10-CM

## 2017-10-25 DIAGNOSIS — D72819 Decreased white blood cell count, unspecified: ICD-10-CM

## 2017-10-25 DIAGNOSIS — K219 Gastro-esophageal reflux disease without esophagitis: ICD-10-CM

## 2017-10-25 DIAGNOSIS — D6959 Other secondary thrombocytopenia: ICD-10-CM

## 2017-10-25 DIAGNOSIS — C50919 Malignant neoplasm of unspecified site of unspecified female breast: ICD-10-CM

## 2017-10-25 DIAGNOSIS — E785 Hyperlipidemia, unspecified: Principal | ICD-10-CM

## 2017-10-25 DIAGNOSIS — Z0389 Encounter for observation for other suspected diseases and conditions ruled out: ICD-10-CM

## 2017-10-25 DIAGNOSIS — D701 Agranulocytosis secondary to cancer chemotherapy: ICD-10-CM

## 2017-10-25 DIAGNOSIS — Z171 Estrogen receptor negative status [ER-]: ICD-10-CM

## 2017-10-25 DIAGNOSIS — Z87440 Personal history of urinary (tract) infections: ICD-10-CM

## 2017-10-25 DIAGNOSIS — D6481 Anemia due to antineoplastic chemotherapy: ICD-10-CM

## 2017-10-25 DIAGNOSIS — Z923 Personal history of irradiation: ICD-10-CM

## 2017-10-25 DIAGNOSIS — Z9289 Personal history of other medical treatment: ICD-10-CM

## 2017-10-25 DIAGNOSIS — R1310 Dysphagia, unspecified: ICD-10-CM

## 2017-10-25 DIAGNOSIS — I10 Essential (primary) hypertension: ICD-10-CM

## 2017-11-09 ENCOUNTER — Encounter: Admit: 2017-11-09 | Discharge: 2017-11-09 | Payer: Private Health Insurance - Indemnity

## 2017-11-29 ENCOUNTER — Ambulatory Visit: Admit: 2017-11-29 | Discharge: 2017-12-14 | Payer: Private Health Insurance - Indemnity

## 2017-12-08 ENCOUNTER — Encounter: Admit: 2017-12-08 | Discharge: 2017-12-08 | Payer: Private Health Insurance - Indemnity

## 2017-12-08 ENCOUNTER — Ambulatory Visit: Admit: 2017-12-08 | Discharge: 2017-12-08 | Payer: Private Health Insurance - Indemnity

## 2017-12-08 DIAGNOSIS — Z9221 Personal history of antineoplastic chemotherapy: ICD-10-CM

## 2017-12-08 DIAGNOSIS — C50919 Malignant neoplasm of unspecified site of unspecified female breast: ICD-10-CM

## 2017-12-08 DIAGNOSIS — Z9289 Personal history of other medical treatment: ICD-10-CM

## 2017-12-08 DIAGNOSIS — E785 Hyperlipidemia, unspecified: Principal | ICD-10-CM

## 2017-12-08 DIAGNOSIS — C50311 Malignant neoplasm of lower-inner quadrant of right female breast: Secondary | ICD-10-CM

## 2017-12-08 DIAGNOSIS — D693 Immune thrombocytopenic purpura: ICD-10-CM

## 2017-12-08 DIAGNOSIS — D72819 Decreased white blood cell count, unspecified: ICD-10-CM

## 2017-12-08 DIAGNOSIS — K219 Gastro-esophageal reflux disease without esophagitis: ICD-10-CM

## 2017-12-08 DIAGNOSIS — Z0389 Encounter for observation for other suspected diseases and conditions ruled out: ICD-10-CM

## 2017-12-08 DIAGNOSIS — I10 Essential (primary) hypertension: ICD-10-CM

## 2017-12-08 DIAGNOSIS — N904 Leukoplakia of vulva: ICD-10-CM

## 2017-12-08 DIAGNOSIS — R1310 Dysphagia, unspecified: ICD-10-CM

## 2017-12-11 ENCOUNTER — Encounter: Admit: 2017-12-11 | Discharge: 2017-12-11 | Payer: Private Health Insurance - Indemnity

## 2017-12-11 ENCOUNTER — Ambulatory Visit: Admit: 2017-12-11 | Discharge: 2017-12-12 | Payer: Private Health Insurance - Indemnity

## 2017-12-11 DIAGNOSIS — N904 Leukoplakia of vulva: ICD-10-CM

## 2017-12-11 DIAGNOSIS — R159 Full incontinence of feces: ICD-10-CM

## 2017-12-11 DIAGNOSIS — E785 Hyperlipidemia, unspecified: Principal | ICD-10-CM

## 2017-12-11 DIAGNOSIS — C50919 Malignant neoplasm of unspecified site of unspecified female breast: ICD-10-CM

## 2017-12-11 DIAGNOSIS — D72819 Decreased white blood cell count, unspecified: ICD-10-CM

## 2017-12-11 DIAGNOSIS — Z9221 Personal history of antineoplastic chemotherapy: ICD-10-CM

## 2017-12-11 DIAGNOSIS — K219 Gastro-esophageal reflux disease without esophagitis: ICD-10-CM

## 2017-12-11 DIAGNOSIS — Z1211 Encounter for screening for malignant neoplasm of colon: ICD-10-CM

## 2017-12-11 DIAGNOSIS — R1310 Dysphagia, unspecified: ICD-10-CM

## 2017-12-11 DIAGNOSIS — D693 Immune thrombocytopenic purpura: ICD-10-CM

## 2017-12-11 DIAGNOSIS — I10 Essential (primary) hypertension: ICD-10-CM

## 2017-12-11 DIAGNOSIS — K5909 Other constipation: ICD-10-CM

## 2017-12-11 DIAGNOSIS — Z0389 Encounter for observation for other suspected diseases and conditions ruled out: ICD-10-CM

## 2017-12-11 DIAGNOSIS — K317 Polyp of stomach and duodenum: ICD-10-CM

## 2017-12-11 DIAGNOSIS — Z9289 Personal history of other medical treatment: ICD-10-CM

## 2017-12-11 MED ORDER — OMEPRAZOLE 20 MG PO CPDR
20 mg | ORAL_CAPSULE | Freq: Every day | ORAL | 3 refills | Status: AC
Start: 2017-12-11 — End: 2018-10-04

## 2017-12-11 MED ORDER — SODIUM,POTASSIUM,MAG SULFATES 17.5-3.13-1.6 GRAM PO SOLR
1 | Freq: Once | ORAL | 0 refills | 30.00000 days | Status: AC
Start: 2017-12-11 — End: ?

## 2017-12-12 DIAGNOSIS — K317 Polyp of stomach and duodenum: ICD-10-CM

## 2017-12-12 DIAGNOSIS — Z1212 Encounter for screening for malignant neoplasm of rectum: ICD-10-CM

## 2017-12-12 DIAGNOSIS — Z1211 Encounter for screening for malignant neoplasm of colon: ICD-10-CM

## 2017-12-12 DIAGNOSIS — K219 Gastro-esophageal reflux disease without esophagitis: Principal | ICD-10-CM

## 2017-12-14 DIAGNOSIS — Z171 Estrogen receptor negative status [ER-]: ICD-10-CM

## 2017-12-14 DIAGNOSIS — Z08 Encounter for follow-up examination after completed treatment for malignant neoplasm: Principal | ICD-10-CM

## 2017-12-14 DIAGNOSIS — C50311 Malignant neoplasm of lower-inner quadrant of right female breast: ICD-10-CM

## 2017-12-15 DIAGNOSIS — Z1211 Encounter for screening for malignant neoplasm of colon: Secondary | ICD-10-CM

## 2017-12-15 DIAGNOSIS — K317 Polyp of stomach and duodenum: Secondary | ICD-10-CM

## 2017-12-21 ENCOUNTER — Encounter: Admit: 2017-12-21 | Discharge: 2017-12-21 | Payer: Private Health Insurance - Indemnity

## 2017-12-21 DIAGNOSIS — Z9221 Personal history of antineoplastic chemotherapy: ICD-10-CM

## 2017-12-21 DIAGNOSIS — Z9289 Personal history of other medical treatment: ICD-10-CM

## 2017-12-21 DIAGNOSIS — D72819 Decreased white blood cell count, unspecified: ICD-10-CM

## 2017-12-21 DIAGNOSIS — N904 Leukoplakia of vulva: ICD-10-CM

## 2017-12-21 DIAGNOSIS — I10 Essential (primary) hypertension: ICD-10-CM

## 2017-12-21 DIAGNOSIS — C50919 Malignant neoplasm of unspecified site of unspecified female breast: ICD-10-CM

## 2017-12-21 DIAGNOSIS — N644 Mastodynia: ICD-10-CM

## 2017-12-21 DIAGNOSIS — R600 Localized edema: ICD-10-CM

## 2017-12-21 DIAGNOSIS — E785 Hyperlipidemia, unspecified: Principal | ICD-10-CM

## 2017-12-21 DIAGNOSIS — Z853 Personal history of malignant neoplasm of breast: ICD-10-CM

## 2017-12-21 DIAGNOSIS — Z0389 Encounter for observation for other suspected diseases and conditions ruled out: ICD-10-CM

## 2017-12-21 DIAGNOSIS — K219 Gastro-esophageal reflux disease without esophagitis: ICD-10-CM

## 2017-12-21 DIAGNOSIS — R1310 Dysphagia, unspecified: ICD-10-CM

## 2017-12-21 DIAGNOSIS — Z08 Encounter for follow-up examination after completed treatment for malignant neoplasm: Principal | ICD-10-CM

## 2017-12-21 DIAGNOSIS — D693 Immune thrombocytopenic purpura: ICD-10-CM

## 2017-12-21 DIAGNOSIS — Z923 Personal history of irradiation: ICD-10-CM

## 2018-01-17 ENCOUNTER — Encounter: Admit: 2018-01-17 | Discharge: 2018-01-17 | Payer: Private Health Insurance - Indemnity

## 2018-01-17 DIAGNOSIS — Z171 Estrogen receptor negative status [ER-]: ICD-10-CM

## 2018-01-17 DIAGNOSIS — D6481 Anemia due to antineoplastic chemotherapy: ICD-10-CM

## 2018-01-17 DIAGNOSIS — C50312 Malignant neoplasm of lower-inner quadrant of left female breast: Principal | ICD-10-CM

## 2018-01-17 DIAGNOSIS — C50919 Malignant neoplasm of unspecified site of unspecified female breast: ICD-10-CM

## 2018-01-17 DIAGNOSIS — Z87440 Personal history of urinary (tract) infections: ICD-10-CM

## 2018-01-17 DIAGNOSIS — T451X5A Adverse effect of antineoplastic and immunosuppressive drugs, initial encounter: ICD-10-CM

## 2018-01-17 DIAGNOSIS — C50311 Malignant neoplasm of lower-inner quadrant of right female breast: ICD-10-CM

## 2018-01-17 DIAGNOSIS — D701 Agranulocytosis secondary to cancer chemotherapy: ICD-10-CM

## 2018-01-17 DIAGNOSIS — E785 Hyperlipidemia, unspecified: Principal | ICD-10-CM

## 2018-01-17 DIAGNOSIS — D696 Thrombocytopenia, unspecified: ICD-10-CM

## 2018-01-17 DIAGNOSIS — R12 Heartburn: ICD-10-CM

## 2018-01-17 DIAGNOSIS — Z0389 Encounter for observation for other suspected diseases and conditions ruled out: ICD-10-CM

## 2018-01-17 DIAGNOSIS — I10 Essential (primary) hypertension: ICD-10-CM

## 2018-01-17 DIAGNOSIS — Z9221 Personal history of antineoplastic chemotherapy: ICD-10-CM

## 2018-01-17 DIAGNOSIS — R1310 Dysphagia, unspecified: ICD-10-CM

## 2018-01-17 DIAGNOSIS — D693 Immune thrombocytopenic purpura: ICD-10-CM

## 2018-01-17 DIAGNOSIS — D72819 Decreased white blood cell count, unspecified: ICD-10-CM

## 2018-01-17 DIAGNOSIS — K219 Gastro-esophageal reflux disease without esophagitis: ICD-10-CM

## 2018-01-17 DIAGNOSIS — Z9289 Personal history of other medical treatment: ICD-10-CM

## 2018-01-17 DIAGNOSIS — N904 Leukoplakia of vulva: ICD-10-CM

## 2018-01-17 LAB — CBC AND DIFF
Lab: 0 10*3/uL (ref 0–0.20)
Lab: 0.1 10*3/uL (ref 0–0.45)
Lab: 0.6 10*3/uL (ref 0–0.80)
Lab: 1 % (ref 0–2)
Lab: 1 % (ref 0–5)
Lab: 1.4 10*3/uL (ref 1.0–4.8)
Lab: 11 % (ref 4–12)
Lab: 116 10*3/uL — ABNORMAL LOW (ref 150–400)
Lab: 12 % (ref 11–15)
Lab: 12 g/dL (ref 12.0–15.0)
Lab: 2.9 10*3/uL (ref 1.8–7.0)
Lab: 28 % (ref 24–44)
Lab: 3.5 M/UL — ABNORMAL LOW (ref 4.0–5.0)
Lab: 33 pg (ref 26–34)
Lab: 34 % — ABNORMAL LOW (ref 36–45)
Lab: 34 g/dL (ref 32.0–36.0)
Lab: 5 10*3/uL (ref 4.5–11.0)
Lab: 59 % (ref 41–77)
Lab: 7.1 FL (ref 7–11)
Lab: 97 FL (ref 80–100)

## 2018-01-19 ENCOUNTER — Encounter: Admit: 2018-01-19 | Discharge: 2018-01-19 | Payer: Private Health Insurance - Indemnity

## 2018-01-29 ENCOUNTER — Encounter: Admit: 2018-01-29 | Discharge: 2018-01-29 | Payer: Private Health Insurance - Indemnity

## 2018-01-29 DIAGNOSIS — E785 Hyperlipidemia, unspecified: Principal | ICD-10-CM

## 2018-01-29 DIAGNOSIS — R1310 Dysphagia, unspecified: ICD-10-CM

## 2018-01-29 DIAGNOSIS — Z0389 Encounter for observation for other suspected diseases and conditions ruled out: ICD-10-CM

## 2018-01-29 DIAGNOSIS — N904 Leukoplakia of vulva: ICD-10-CM

## 2018-01-29 DIAGNOSIS — I10 Essential (primary) hypertension: ICD-10-CM

## 2018-01-29 DIAGNOSIS — D693 Immune thrombocytopenic purpura: ICD-10-CM

## 2018-01-29 DIAGNOSIS — K219 Gastro-esophageal reflux disease without esophagitis: ICD-10-CM

## 2018-01-29 DIAGNOSIS — C50919 Malignant neoplasm of unspecified site of unspecified female breast: ICD-10-CM

## 2018-01-29 DIAGNOSIS — Z9221 Personal history of antineoplastic chemotherapy: ICD-10-CM

## 2018-01-29 DIAGNOSIS — D72819 Decreased white blood cell count, unspecified: ICD-10-CM

## 2018-01-29 DIAGNOSIS — Z9289 Personal history of other medical treatment: ICD-10-CM

## 2018-02-01 ENCOUNTER — Encounter: Admit: 2018-02-01 | Discharge: 2018-02-02 | Payer: Private Health Insurance - Indemnity

## 2018-02-01 ENCOUNTER — Encounter: Admit: 2018-02-01 | Discharge: 2018-02-01 | Payer: Private Health Insurance - Indemnity

## 2018-02-01 ENCOUNTER — Ambulatory Visit: Admit: 2018-02-01 | Discharge: 2018-02-01 | Payer: Private Health Insurance - Indemnity

## 2018-02-01 DIAGNOSIS — Z9289 Personal history of other medical treatment: Secondary | ICD-10-CM

## 2018-02-01 DIAGNOSIS — Z1211 Encounter for screening for malignant neoplasm of colon: ICD-10-CM

## 2018-02-01 DIAGNOSIS — K219 Gastro-esophageal reflux disease without esophagitis: Principal | ICD-10-CM

## 2018-02-01 DIAGNOSIS — K317 Polyp of stomach and duodenum: ICD-10-CM

## 2018-02-01 DIAGNOSIS — R1310 Dysphagia, unspecified: ICD-10-CM

## 2018-02-01 DIAGNOSIS — K449 Diaphragmatic hernia without obstruction or gangrene: ICD-10-CM

## 2018-02-01 DIAGNOSIS — D72819 Decreased white blood cell count, unspecified: ICD-10-CM

## 2018-02-01 DIAGNOSIS — Z1212 Encounter for screening for malignant neoplasm of rectum: ICD-10-CM

## 2018-02-01 DIAGNOSIS — Z9221 Personal history of antineoplastic chemotherapy: ICD-10-CM

## 2018-02-01 DIAGNOSIS — N904 Leukoplakia of vulva: ICD-10-CM

## 2018-02-01 DIAGNOSIS — Z0389 Encounter for observation for other suspected diseases and conditions ruled out: ICD-10-CM

## 2018-02-01 DIAGNOSIS — C50919 Malignant neoplasm of unspecified site of unspecified female breast: ICD-10-CM

## 2018-02-01 DIAGNOSIS — E785 Hyperlipidemia, unspecified: Principal | ICD-10-CM

## 2018-02-01 DIAGNOSIS — I10 Essential (primary) hypertension: ICD-10-CM

## 2018-02-01 DIAGNOSIS — D693 Immune thrombocytopenic purpura: ICD-10-CM

## 2018-02-01 MED ORDER — LACTATED RINGERS IV SOLP
INTRAVENOUS | 0 refills | Status: DC
Start: 2018-02-01 — End: 2018-02-06

## 2018-02-01 MED ORDER — PROPOFOL 10 MG/ML IV EMUL 50 ML (INFUSION)(AM)(OR)
INTRAVENOUS | 0 refills | Status: DC
Start: 2018-02-01 — End: 2018-02-01

## 2018-02-01 MED ORDER — ONDANSETRON HCL (PF) 4 MG/2 ML IJ SOLN
4 mg | Freq: Once | INTRAVENOUS | 0 refills | Status: DC | PRN
Start: 2018-02-01 — End: 2018-02-06

## 2018-02-01 MED ORDER — LIDOCAINE (PF) 10 MG/ML (1 %) IJ SOLN
.1-2 mL | INTRAMUSCULAR | 0 refills | Status: DC | PRN
Start: 2018-02-01 — End: 2018-02-06

## 2018-02-01 MED ORDER — SIMETHICONE 40 MG/0.6 ML PO DRPS
0 refills | Status: DC
Start: 2018-02-01 — End: 2018-02-06

## 2018-02-01 MED ORDER — LIDOCAINE (PF) 100 MG/5 ML (2 %) IV SYRG
0 refills | Status: DC
Start: 2018-02-01 — End: 2018-02-01

## 2018-02-01 MED ORDER — OXYCODONE 5 MG PO TAB
5-10 mg | Freq: Once | ORAL | 0 refills | Status: DC | PRN
Start: 2018-02-01 — End: 2018-02-06

## 2018-02-02 ENCOUNTER — Encounter: Admit: 2018-02-02 | Discharge: 2018-02-02 | Payer: Private Health Insurance - Indemnity

## 2018-02-02 DIAGNOSIS — I10 Essential (primary) hypertension: ICD-10-CM

## 2018-02-02 DIAGNOSIS — K449 Diaphragmatic hernia without obstruction or gangrene: ICD-10-CM

## 2018-02-02 DIAGNOSIS — C50919 Malignant neoplasm of unspecified site of unspecified female breast: ICD-10-CM

## 2018-02-02 DIAGNOSIS — E785 Hyperlipidemia, unspecified: Principal | ICD-10-CM

## 2018-02-02 DIAGNOSIS — K219 Gastro-esophageal reflux disease without esophagitis: ICD-10-CM

## 2018-02-02 DIAGNOSIS — R1310 Dysphagia, unspecified: ICD-10-CM

## 2018-02-02 DIAGNOSIS — Z0389 Encounter for observation for other suspected diseases and conditions ruled out: ICD-10-CM

## 2018-02-02 DIAGNOSIS — N904 Leukoplakia of vulva: ICD-10-CM

## 2018-02-02 DIAGNOSIS — Z9289 Personal history of other medical treatment: Secondary | ICD-10-CM

## 2018-02-02 DIAGNOSIS — Z9221 Personal history of antineoplastic chemotherapy: ICD-10-CM

## 2018-02-02 DIAGNOSIS — D72819 Decreased white blood cell count, unspecified: ICD-10-CM

## 2018-02-02 DIAGNOSIS — D693 Immune thrombocytopenic purpura: ICD-10-CM

## 2018-02-05 ENCOUNTER — Encounter: Admit: 2018-02-05 | Discharge: 2018-02-05 | Payer: Private Health Insurance - Indemnity

## 2018-02-21 ENCOUNTER — Encounter: Admit: 2018-02-21 | Discharge: 2018-02-21 | Payer: Private Health Insurance - Indemnity

## 2018-03-02 ENCOUNTER — Encounter: Admit: 2018-03-02 | Discharge: 2018-03-02 | Payer: Private Health Insurance - Indemnity

## 2018-03-02 DIAGNOSIS — N904 Leukoplakia of vulva: Secondary | ICD-10-CM

## 2018-03-02 DIAGNOSIS — Z9289 Personal history of other medical treatment: Secondary | ICD-10-CM

## 2018-03-02 DIAGNOSIS — C50311 Malignant neoplasm of lower-inner quadrant of right female breast: Secondary | ICD-10-CM

## 2018-03-02 DIAGNOSIS — Z1231 Encounter for screening mammogram for malignant neoplasm of breast: Secondary | ICD-10-CM

## 2018-03-02 DIAGNOSIS — D72819 Decreased white blood cell count, unspecified: Secondary | ICD-10-CM

## 2018-03-02 DIAGNOSIS — E785 Hyperlipidemia, unspecified: Secondary | ICD-10-CM

## 2018-03-02 DIAGNOSIS — Z171 Estrogen receptor negative status [ER-]: Secondary | ICD-10-CM

## 2018-03-02 DIAGNOSIS — D693 Immune thrombocytopenic purpura: Secondary | ICD-10-CM

## 2018-03-02 DIAGNOSIS — K449 Diaphragmatic hernia without obstruction or gangrene: Secondary | ICD-10-CM

## 2018-03-02 DIAGNOSIS — R1310 Dysphagia, unspecified: Secondary | ICD-10-CM

## 2018-03-02 DIAGNOSIS — Z0389 Encounter for observation for other suspected diseases and conditions ruled out: Secondary | ICD-10-CM

## 2018-03-02 DIAGNOSIS — K219 Gastro-esophageal reflux disease without esophagitis: Secondary | ICD-10-CM

## 2018-03-02 DIAGNOSIS — Z9221 Personal history of antineoplastic chemotherapy: Secondary | ICD-10-CM

## 2018-03-02 DIAGNOSIS — I10 Essential (primary) hypertension: Secondary | ICD-10-CM

## 2018-03-02 DIAGNOSIS — C50919 Malignant neoplasm of unspecified site of unspecified female breast: Secondary | ICD-10-CM

## 2018-05-11 ENCOUNTER — Encounter: Admit: 2018-05-11 | Discharge: 2018-05-11 | Payer: Private Health Insurance - Indemnity

## 2018-05-30 ENCOUNTER — Encounter: Admit: 2018-05-30 | Discharge: 2018-05-30 | Payer: Private Health Insurance - Indemnity

## 2018-05-30 DIAGNOSIS — D72819 Decreased white blood cell count, unspecified: ICD-10-CM

## 2018-05-30 DIAGNOSIS — Z0389 Encounter for observation for other suspected diseases and conditions ruled out: ICD-10-CM

## 2018-05-30 DIAGNOSIS — N904 Leukoplakia of vulva: ICD-10-CM

## 2018-05-30 DIAGNOSIS — D693 Immune thrombocytopenic purpura: ICD-10-CM

## 2018-05-30 DIAGNOSIS — N390 Urinary tract infection, site not specified: ICD-10-CM

## 2018-05-30 DIAGNOSIS — E785 Hyperlipidemia, unspecified: Principal | ICD-10-CM

## 2018-05-30 DIAGNOSIS — K219 Gastro-esophageal reflux disease without esophagitis: ICD-10-CM

## 2018-05-30 DIAGNOSIS — I10 Essential (primary) hypertension: ICD-10-CM

## 2018-05-30 DIAGNOSIS — C50919 Malignant neoplasm of unspecified site of unspecified female breast: ICD-10-CM

## 2018-05-30 DIAGNOSIS — Z9289 Personal history of other medical treatment: ICD-10-CM

## 2018-05-30 DIAGNOSIS — R1310 Dysphagia, unspecified: ICD-10-CM

## 2018-05-30 DIAGNOSIS — K449 Diaphragmatic hernia without obstruction or gangrene: ICD-10-CM

## 2018-05-30 DIAGNOSIS — Z9221 Personal history of antineoplastic chemotherapy: ICD-10-CM

## 2018-05-30 DIAGNOSIS — D696 Thrombocytopenia, unspecified: ICD-10-CM

## 2018-05-30 DIAGNOSIS — C50311 Malignant neoplasm of lower-inner quadrant of right female breast: Principal | ICD-10-CM

## 2018-05-30 NOTE — Patient Instructions
Thank you for your visit with Dr. Freida Busman today.    Lillia Abed Selig/ Arnoldo Hooker are the Clinical Nurse Coordinators for Dr. Freida Busman.   Phone (Mon- Fri 8 AM- 4 PM): [phone]  Because we move between offices, the nurses use virtual phones which require you to always leave a voicemail; they are excellent about checking and returning messages regularly.     For the physician on call after hours/ weekends for urgent needs that cannot wait until the next business day:  [phone]     MyChart is a valuable tool highly utilized by our clinic to allow you to view tests results once released, request and see appointments, request medical refills, and send messages to our staff.  Messages are reviewed Monday - Friday 8 AM - 4 PM. Please do NOT leave urgent messages on MyChart that require immediate assistance. If you need help signing up for MyChart, please let Lillia Abed know!    Scheduling: [phone].    Plan:      For up to date information on the COVID-19 virus, visit the [facility] website. [email or web address]  ? General supportive care during cold and flu season and infection prevention reminders:   o Wash hands often with soap and water for at least 20 seconds  o Cover your mouth and nose  o Social distancing: try to maintain 6 feet between you and other people  o Stay home if sick and symptoms mild or manageable  ? If you must be around people wear a mask    ? If you are having symptoms of a lower respiratory infection (cough, shortness of breath) and/or fever AND either traveled in last 30 days (internationally or to region of exposure) OR known exposure to patient with COVID19:    o Call your primary care provider for questions or health needs.   ? Tell your doctor about your recent travel and your symptoms    o In a medical emergency, call 911 or go to the nearest emergency room.    Return in 3 mth  Please call with any questions.

## 2018-05-30 NOTE — Progress Notes
(ESOPHAGOGASTRODUODENOSCOPY WITH BIOPSY - FLEXIBLE performed by Virgina Organ, MD at [facility] OR/PERIOP).    Teresa Trevino's family history includes Cancer in her maternal grandmother; Diabetes in her father, maternal grandfather, mother, and paternal uncle; Heart Failure in her father; High Cholesterol in her mother; Hypertension in her mother; Kidney Failure in her mother; None Reported in her sister; Stroke in her mother; Thyroid Disease in her mother.    Social history: Married.  Her husband is a semiretired Education officer, community who is president of the [employer].  They have a condo in [place] which they enjoy and also rent out.  She has never been a smoker.  She rarely drinks alcohol, no drug use.        Medications    Current Outpatient Medications:   ???  acetaminophen (TYLENOL PO), Take  by mouth., Disp: , Rfl:   ???  cephalexin (KEFLEX) 250 mg capsule, Take 250 mg by mouth daily. Maintenance for UTIs, Disp: , Rfl:   ???  clobetasol (TEMOVATE) 0.05 % topical cream, Apply  topically to affected area twice daily., Disp: , Rfl:   ???  estradiol (ESTRACE) 0.01 % (0.1 mg/g) vaginal cream, Apply 0.5 gram to the urethral opening at night for 2 weeks then twice weekly (Patient taking differently: every 7 days. Apply 0.5 gram to the urethral opening at night for 2 weeks then twice weekly), Disp: 42.5 g, Rfl: 11  ???  Lactobacillus rhamnosus GG (CULTURELLE PO), Take 1 tablet by mouth daily., Disp: , Rfl:   ???  losartan (COZAAR) 50 mg tablet, Take 50 mg by mouth daily., Disp: , Rfl:   ???  omeprazole DR(+) (PRILOSEC) 20 mg capsule, Take one capsule by mouth daily before breakfast., Disp: 90 capsule, Rfl: 3     Allergies:   Allergies   Allergen Reactions   ??? Bactrim [Sulfamethoxazole-Trimethoprim] NAUSEA AND VOMITING     Makes me feel ill   ??? Erythromycin NAUSEA AND VOMITING   ??? Macrobid [Nitrofurantoin Monohyd/M-Cryst] NAUSEA AND VOMITING   ??? Sulfa (Sulfonamide Antibiotics) NAUSEA AND VOMITING       Review of Systems Review of Systems   Constitutional: Negative.  Negative for activity change, fever and unexpected weight change.   HENT: Negative.  Negative for mouth sores.    Eyes: Negative.    Respiratory: Negative.  Negative for shortness of breath.    Cardiovascular: Negative.  Negative for palpitations.   Gastrointestinal: Negative.  Negative for abdominal pain and vomiting. Diarrhea: Improved with Imodium.   Endocrine: Negative.    Genitourinary: Negative.         Recurrent UTI continue   Musculoskeletal: Negative.    Skin: Negative.  Negative for pallor.   Allergic/Immunologic: Negative.    Neurological: Negative.  Negative for dizziness and light-headedness.   Hematological: Negative.    Psychiatric/Behavioral: Negative.    All other systems reviewed and are negative.    Pain Score: 0     Pain Addressed: N/A    Patient Evaluated for a Clinical Trial: No treatment clinical trial available for this patient.    [place] Cooperative Oncology Group performance status is 0, Fully active, able to carry on all pre-disease performance without restriction.    Physical Exam  Vitals:    05/30/18 1050 05/30/18 1055   BP: 138/80    Pulse: 85    Resp: 18    Temp: 36.6 ???C (97.8 ???F)    TempSrc: Oral Oral   SpO2: 98%    Weight: 93.3 kg (205  lb 9.6 oz)    Height: 172.7 cm (67.99)    PainSc: Zero       Physical Exam   Constitutional: She is oriented to person, place, and time and well-developed, well-nourished, and in no distress. No distress.   Eyes: EOM are normal. No scleral icterus.   Cardiovascular: Normal rate, regular rhythm, normal heart sounds and intact distal pulses.   Pulmonary/Chest: Effort normal and breath sounds normal. No respiratory distress. She has no wheezes. She has no rales. Right breast exhibits no inverted nipple, no mass, no nipple discharge, no skin change and no tenderness. Left breast exhibits no inverted nipple, no mass, no nipple discharge, no skin change and no tenderness. Breasts are symmetrical.

## 2018-05-31 ENCOUNTER — Encounter: Admit: 2018-05-31 | Discharge: 2018-05-31 | Payer: Private Health Insurance - Indemnity

## 2018-05-31 ENCOUNTER — Ambulatory Visit: Admit: 2018-05-31 | Discharge: 2018-06-14 | Payer: Private Health Insurance - Indemnity

## 2018-06-30 ENCOUNTER — Ambulatory Visit: Admit: 2018-06-30 | Discharge: 2018-07-15 | Payer: Private Health Insurance - Indemnity

## 2018-06-30 DIAGNOSIS — Z08 Encounter for follow-up examination after completed treatment for malignant neoplasm: Principal | ICD-10-CM

## 2018-07-13 ENCOUNTER — Ambulatory Visit: Admit: 2018-07-13 | Discharge: 2018-07-13 | Payer: Private Health Insurance - Indemnity

## 2018-07-13 ENCOUNTER — Encounter: Admit: 2018-07-13 | Discharge: 2018-07-13 | Payer: Private Health Insurance - Indemnity

## 2018-07-13 DIAGNOSIS — K449 Diaphragmatic hernia without obstruction or gangrene: ICD-10-CM

## 2018-07-13 DIAGNOSIS — R1310 Dysphagia, unspecified: ICD-10-CM

## 2018-07-13 DIAGNOSIS — Z9221 Personal history of antineoplastic chemotherapy: ICD-10-CM

## 2018-07-13 DIAGNOSIS — Z0389 Encounter for observation for other suspected diseases and conditions ruled out: ICD-10-CM

## 2018-07-13 DIAGNOSIS — D696 Thrombocytopenia, unspecified: ICD-10-CM

## 2018-07-13 DIAGNOSIS — E785 Hyperlipidemia, unspecified: Principal | ICD-10-CM

## 2018-07-13 DIAGNOSIS — I10 Essential (primary) hypertension: ICD-10-CM

## 2018-07-13 DIAGNOSIS — D72819 Decreased white blood cell count, unspecified: ICD-10-CM

## 2018-07-13 DIAGNOSIS — C50311 Malignant neoplasm of lower-inner quadrant of right female breast: Principal | ICD-10-CM

## 2018-07-13 DIAGNOSIS — C50919 Malignant neoplasm of unspecified site of unspecified female breast: ICD-10-CM

## 2018-07-13 DIAGNOSIS — K219 Gastro-esophageal reflux disease without esophagitis: ICD-10-CM

## 2018-07-13 DIAGNOSIS — Z9289 Personal history of other medical treatment: ICD-10-CM

## 2018-07-13 DIAGNOSIS — N904 Leukoplakia of vulva: ICD-10-CM

## 2018-07-13 DIAGNOSIS — N390 Urinary tract infection, site not specified: ICD-10-CM

## 2018-07-13 DIAGNOSIS — D693 Immune thrombocytopenic purpura: ICD-10-CM

## 2018-07-13 LAB — LIPID PROFILE
Lab: 236 — ABNORMAL HIGH (ref <200)
Lab: 27
Lab: 37 — ABNORMAL LOW

## 2018-07-13 LAB — COMPREHENSIVE METABOLIC PANEL
Lab: 0.6
Lab: 6.8

## 2018-07-13 LAB — THYROID STIMULATING HORMONE-TSH: Lab: 1.3

## 2018-07-13 NOTE — Progress Notes
Date Time Provider Department Center   07/13/2018 12:00 PM Reola Mosher, MD [facility] [place] [facility]   07/25/2018  3:00 PM Sabino Gasser, MD MACSTJOECL [facility] Exam   08/27/2018 11:00 AM Alwyn Pea, DO UKCCNORTHEXM [place] Exam   10/16/2018 10:00 AM Staecker, Hennie Duos, MD [facility]N OB/GYN   02/27/2019  8:45 AM SCREENING [facility] ROOM [facility]   02/27/2019 10:30 AM Hoyle Sauer, Suzette Battiest, PA-C CCC2 [place] Exam   02/27/2019 10:30 AM BIS CC/WW ??? BIOIMPEDENCE SPECTROSCOPY CCC2 [place] Exam          Possible Post Treatment Side Effects Symptoms Your Risk Level   Platinum late effects Problems with hearing, balance, ringing in the ears, changes in urination Increased Risk   Chemotherapy induced peripheral neuropathy (CIPN) Numbness, tingling, pins and needles feeling in fingers and toes Increased Risk   Arthralgia, Myalgia Muscle or joint pain Increased Risk   Pain In one area or wide spread Increased Risk   Lymphedema Arm swelling Increased Risk       Preventive Screening Guidelines for Healthy Adults  Getting preventive care is one of the most important steps you can take to manage your health. That's because when a condition is diagnosed early, it is usually easier to treat. And regular checkups can help you and your doctor identify lifestyle changes you can make to avoid certain conditions.    Please see the screening guidelines below to see if you're up-to-date.  Routine Checkups 18-29 years 30-39 years 40-49 years 50-64 years 65+ years   Includes personal history; blood pressure; body mass index (BMI); physical exam; preventive screening; and counseling Annually for  ages 43???21     Annually Annually    Every 1???3 years, depending on risk factors2     Cancer Screenings        Colorectal Cancer Not routine except for patients at high risk2 Colonoscopy at age 41 and then every 10 years, or annual fecal occult blood test (FOBT) plus sigmoidoscopy every 5 years, or sigmoidoscopy every

## 2018-07-14 ENCOUNTER — Encounter: Admit: 2018-07-14 | Discharge: 2018-07-14 | Payer: Private Health Insurance - Indemnity

## 2018-07-14 DIAGNOSIS — K449 Diaphragmatic hernia without obstruction or gangrene: ICD-10-CM

## 2018-07-14 DIAGNOSIS — D693 Immune thrombocytopenic purpura: ICD-10-CM

## 2018-07-14 DIAGNOSIS — E785 Hyperlipidemia, unspecified: Principal | ICD-10-CM

## 2018-07-14 DIAGNOSIS — C50919 Malignant neoplasm of unspecified site of unspecified female breast: ICD-10-CM

## 2018-07-14 DIAGNOSIS — I10 Essential (primary) hypertension: ICD-10-CM

## 2018-07-14 DIAGNOSIS — Z9221 Personal history of antineoplastic chemotherapy: ICD-10-CM

## 2018-07-14 DIAGNOSIS — K219 Gastro-esophageal reflux disease without esophagitis: ICD-10-CM

## 2018-07-14 DIAGNOSIS — D72819 Decreased white blood cell count, unspecified: ICD-10-CM

## 2018-07-14 DIAGNOSIS — Z9289 Personal history of other medical treatment: ICD-10-CM

## 2018-07-14 DIAGNOSIS — R1310 Dysphagia, unspecified: ICD-10-CM

## 2018-07-14 DIAGNOSIS — N904 Leukoplakia of vulva: ICD-10-CM

## 2018-07-14 DIAGNOSIS — Z0389 Encounter for observation for other suspected diseases and conditions ruled out: ICD-10-CM

## 2018-07-14 NOTE — Progress Notes
Date: 07/13/2018       Teresa Trevino is a 63 y.o. female.     The encounter diagnosis was Malignant neoplasm of lower-inner quadrant of right breast of female, estrogen receptor negative (HCC).  Staging: Cancer Staging  Malignant neoplasm of lower-inner quadrant of right breast of female, estrogen receptor negative (HCC)  Staging form: Breast, AJCC 8th Edition  - Clinical stage from 01/19/2017: Stage IB (cT1b, cN0, cM0, G3, ER-, PR-, HER2-) - Signed by Dimas Alexandria, PA-C on 01/24/2017        History of Present Illness  63 year-old female with triple negative right breast cancer, stage IB (T1B N0 M0), status post neoadjuvant carboplatin/docetaxel and lumpectomy 06/2017 with complete response. She completed adjuvant radiation to the right breast to a total of 5,005 cGy in 20 fractions between 09/07/2017 and 10/05/2017.     Review of Systems   Constitutional: Negative.    HENT: Negative.    Eyes: Negative.    Respiratory: Negative.    Cardiovascular: Negative.    Gastrointestinal: Negative.    Endocrine: Negative.    Genitourinary: Negative.    Musculoskeletal: Negative.    Skin: Negative.    Allergic/Immunologic: Negative.    Neurological: Negative.    Hematological: Negative.    Psychiatric/Behavioral: Negative.          Subjective:          Cancer Staging  Malignant neoplasm of lower-inner quadrant of right breast of female, estrogen receptor negative (HCC)  Staging form: Breast, AJCC 8th Edition  - Clinical stage from 01/19/2017: Stage IB (cT1b, cN0, cM0, G3, ER-, PR-, HER2-) - Signed by Dimas Alexandria, PA-C on 01/24/2017    Teresa Trevino returns today for followup. She was last seen in our clinic in October 2019. On evaluation today she reports she is doing well and has no new complaints or concerns to discuss.    Objective:         ??? acetaminophen (TYLENOL PO) Take  by mouth.   ??? cephalexin (KEFLEX) 250 mg capsule Take 250 mg by mouth daily. Maintenance for UTIs ??? clobetasol (TEMOVATE) 0.05 % topical cream Apply  topically to affected area twice daily.   ??? estradiol (ESTRACE) 0.01 % (0.1 mg/g) vaginal cream Apply 0.5 gram to the urethral opening at night for 2 weeks then twice weekly (Patient taking differently: every 7 days. Apply 0.5 gram to the urethral opening at night for 2 weeks then twice weekly)   ??? Lactobacillus rhamnosus GG (CULTURELLE PO) Take 1 tablet by mouth daily.   ??? losartan (COZAAR) 50 mg tablet Take 50 mg by mouth daily.   ??? omeprazole DR(+) (PRILOSEC) 20 mg capsule Take one capsule by mouth daily before breakfast.     Vitals:    07/13/18 1132   BP: 126/78   Pulse: 85   Resp: 18   Temp: 36.1 ???C (96.9 ???F)   SpO2: 97%   Weight: 93 kg (205 lb)   PainSc: Zero     Body mass index is 31.18 kg/m???.     Pain Score: Zero        Fatigue Scale: 0-None    KARNOFSKY PERFORMANCE SCORE:  100% Normal, no complaints     Physical Exam     GENERAL: She is awake, alert, oriented and in no acute distress.  HEENT: Atraumatic, normocephalic, pupils are equal, round, reactive to light and accommodation. Normal conjunctivae. She is wearing a face mask that covers the lower portion of her face and  nose.   LYMPH NODES: No palpable cervical and axillary lymphadenopathy bilaterally.No palpable supraclavicular lymphadenopathy bilaterally.  CARDIOVASCULAR: Regular rate and rhythm. No peripheral edema.  PULMONARY: Clear to auscultation bilaterally. No use of accessory muscles to breathe.  BREASTS: The breasts were examined in the upright position with her expressed verbal consent.  On visual inspection the patient's breasts were grossly.  The surgical incision in the right breast is clean, dry, intact, well-healed, without evidence of active infection.  The remainder of the right breast is soft, without palpable masses, nodules, lesions, ulcerations, skin dimpling, peau d'orange changes, nipple retraction, or discharge.  The left breast is soft, without palpable masses, nodules,

## 2018-07-15 DIAGNOSIS — Z171 Estrogen receptor negative status [ER-]: ICD-10-CM

## 2018-07-15 DIAGNOSIS — Z853 Personal history of malignant neoplasm of breast: ICD-10-CM

## 2018-07-15 DIAGNOSIS — Z08 Encounter for follow-up examination after completed treatment for malignant neoplasm: Principal | ICD-10-CM

## 2018-07-25 ENCOUNTER — Encounter: Admit: 2018-07-25 | Discharge: 2018-07-25

## 2018-07-25 ENCOUNTER — Ambulatory Visit: Admit: 2018-07-25 | Discharge: 2018-07-26

## 2018-07-25 DIAGNOSIS — Z9289 Personal history of other medical treatment: Secondary | ICD-10-CM

## 2018-07-25 DIAGNOSIS — E7800 Pure hypercholesterolemia, unspecified: Secondary | ICD-10-CM

## 2018-07-25 DIAGNOSIS — N904 Leukoplakia of vulva: Secondary | ICD-10-CM

## 2018-07-25 DIAGNOSIS — R931 Abnormal findings on diagnostic imaging of heart and coronary circulation: Secondary | ICD-10-CM

## 2018-07-25 DIAGNOSIS — K449 Diaphragmatic hernia without obstruction or gangrene: Secondary | ICD-10-CM

## 2018-07-25 DIAGNOSIS — E785 Hyperlipidemia, unspecified: Secondary | ICD-10-CM

## 2018-07-25 DIAGNOSIS — C50919 Malignant neoplasm of unspecified site of unspecified female breast: Secondary | ICD-10-CM

## 2018-07-25 DIAGNOSIS — I10 Essential (primary) hypertension: Secondary | ICD-10-CM

## 2018-07-25 DIAGNOSIS — D693 Immune thrombocytopenic purpura: Secondary | ICD-10-CM

## 2018-07-25 DIAGNOSIS — K219 Gastro-esophageal reflux disease without esophagitis: Secondary | ICD-10-CM

## 2018-07-25 DIAGNOSIS — Z9221 Personal history of antineoplastic chemotherapy: Secondary | ICD-10-CM

## 2018-07-25 DIAGNOSIS — Z0389 Encounter for observation for other suspected diseases and conditions ruled out: Secondary | ICD-10-CM

## 2018-07-25 DIAGNOSIS — I2510 Atherosclerotic heart disease of native coronary artery without angina pectoris: Secondary | ICD-10-CM

## 2018-07-25 DIAGNOSIS — R1310 Dysphagia, unspecified: Secondary | ICD-10-CM

## 2018-07-25 DIAGNOSIS — D72819 Decreased white blood cell count, unspecified: Secondary | ICD-10-CM

## 2018-08-27 ENCOUNTER — Encounter: Admit: 2018-08-27 | Discharge: 2018-08-27

## 2018-08-27 DIAGNOSIS — R1310 Dysphagia, unspecified: Secondary | ICD-10-CM

## 2018-08-27 DIAGNOSIS — D72819 Decreased white blood cell count, unspecified: Secondary | ICD-10-CM

## 2018-08-27 DIAGNOSIS — N904 Leukoplakia of vulva: Secondary | ICD-10-CM

## 2018-08-27 DIAGNOSIS — Z0389 Encounter for observation for other suspected diseases and conditions ruled out: Secondary | ICD-10-CM

## 2018-08-27 DIAGNOSIS — K219 Gastro-esophageal reflux disease without esophagitis: Secondary | ICD-10-CM

## 2018-08-27 DIAGNOSIS — D693 Immune thrombocytopenic purpura: Secondary | ICD-10-CM

## 2018-08-27 DIAGNOSIS — K449 Diaphragmatic hernia without obstruction or gangrene: Secondary | ICD-10-CM

## 2018-08-27 DIAGNOSIS — Z9221 Personal history of antineoplastic chemotherapy: Secondary | ICD-10-CM

## 2018-08-27 DIAGNOSIS — D696 Thrombocytopenia, unspecified: Secondary | ICD-10-CM

## 2018-08-27 DIAGNOSIS — I10 Essential (primary) hypertension: Secondary | ICD-10-CM

## 2018-08-27 DIAGNOSIS — C50919 Malignant neoplasm of unspecified site of unspecified female breast: Secondary | ICD-10-CM

## 2018-08-27 DIAGNOSIS — Z9289 Personal history of other medical treatment: Secondary | ICD-10-CM

## 2018-08-27 DIAGNOSIS — E785 Hyperlipidemia, unspecified: Secondary | ICD-10-CM

## 2018-08-27 NOTE — Progress Notes
08/27/2018    Name: Teresa Trevino  DOB: 1955/03/12  MRN: [id number]    Primary Care Physician: Rockwell Germany   Surgeon: Dr. Ruthy Dick     Chief Complaint:   Chief Complaint   Patient presents with   ??? Cancer     Hematology/Oncology History:  Cancer Staging  Malignant neoplasm of lower-inner quadrant of right breast of female, estrogen receptor negative (HCC)  Staging form: Breast, AJCC 8th Edition  - Clinical stage from 01/19/2017: Stage IB (cT1b, cN0, cM0, G3, ER-, PR-, HER2-) - Signed by Dimas Alexandria, PA-C on 01/24/2017    1.  History of chronic intermittent leukopenia and thrombocytopenia, clinically insignificant.  May have autoimmune component and appeared to be her baseline.  However, labs in 01/2017 were unremarkable.  2.  November 2018: Noted on screening mammogram to have 1.2 cm abnormality in the right breast.  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  3.  06/01/2017: Completed neoadjuvant carboplatin/docetaxel x6 with complete response obtained on breast MRI and ultrasound.  After cycle 1, decreased by 20% due to significant toxicity.  4.  07/06/2017: Lumpectomy (Dr. Loreta Ave) showed complete response with no active malignancy.  0/4 lymph nodes involved.  5.  10/05/2017: Completed radiation (Dr. Ardeth Perfect)  6.  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  W.  Current plan: Observation    EGD/colonoscopy 01/2018 ([place]) negative, 10-year follow-up recommended  Mammogram 02/2018 ([place]): Negative    Survivorship visit: Completed 06/2018    Interval Events  Teresa Trevino presents to the clinic today unaccompanied for follow-up of her breast cancer.  She and her husband have been very busy downsizing their house and are remodeling the current house they are living in by planning to sell in the future.  She has had last zinging sensations in her breast.  She has had no further UTIs with the Keflex prophylaxis.  She denies any other new changes to her health since our last visit.    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  No exercise induced chest pain.  2.  No ECG evidence of ischemia.  3.  No echocardiographic evidence of ischemia.  4.  Normal  Hemodynamic response to exercise.  5.  No significant exercise induced arrhythmias.  6.  Low risk scan.  7.  Consider medical management if indicated.).    She has a past surgical history that includes Bladder surgery (2013) (bladder sling); hysterectomy (2008); colonoscopy; Foot surgery; ct angiogram (non-invasive) (09/2015); Upper gastrointestinal endoscopy (05/26/2017); tonsillectomy (1977); Mastectomy, partial (Right, 07/06/2017) (RIGHT RADIOACTIVE SEED LOCALIZED LUMPECTOMY performed by Ruthy Dick, DO at [facility] OR/PERIOP); lymph node biopsy (Right, 07/06/2017) (SENTINEL LYMPH NODE BIOPSY performed by Ruthy Dick, DO at [facility] OR/PERIOP); Colonoscopy (N/A, 02/01/2018) (COLONOSCOPY DIAGNOSTIC WITH SPECIMEN COLLECTION BY BRUSHING/ WASHING - FLEXIBLE performed by Virgina Organ, MD at [facility] OR/PERIOP); and Upper gastrointestinal endoscopy (N/A, 02/01/2018) (ESOPHAGOGASTRODUODENOSCOPY WITH BIOPSY - FLEXIBLE performed by Virgina Organ, MD at [facility] OR/PERIOP).  Teresa Trevino's family history includes Cancer in her maternal grandmother; Diabetes in her father, maternal grandfather, mother, and paternal uncle; Heart Failure in her father; High Cholesterol in her mother; Hypertension in her mother; Kidney Failure in her mother; None Reported in her sister; Stroke in her mother; Thyroid Disease in her mother.    Social history: Married.  Her husband is a semiretired Education officer, community who is president of the [employer].  They have a condo in [place] which they enjoy and also rent out.  She has never been a smoker.  She rarely drinks alcohol, no drug use.        Medications    Current Outpatient Medications:   ???  acetaminophen (TYLENOL PO), Take  by mouth as Needed., Disp: , Rfl:   ???  cephalexin (KEFLEX) 250 mg capsule, Take 250 mg by mouth daily. Maintenance for UTIs, Disp: , Rfl:   ???  clobetasol (TEMOVATE) 0.05 % topical cream, Apply  topically to affected area three times weekly., Disp: , Rfl:   ???  estradiol (ESTRACE) 0.01 % (0.1 mg/g) vaginal cream, Apply 0.5 gram to the urethral opening at night for 2 weeks then twice weekly (Patient taking differently: as Needed. Apply 0.5 gram to the urethral opening at night for 2 weeks then twice weekly), Disp: 42.5 g, Rfl: 11  ???  Lactobacillus rhamnosus GG (CULTURELLE PO), Take 1 tablet by mouth daily., Disp: , Rfl:   ???  losartan (COZAAR) 50 mg tablet, Take 50 mg by mouth daily., Disp: , Rfl:   ???  MULTIVITAMIN PO, Take 1 tablet by mouth daily., Disp: , Rfl:   ???  omeprazole DR(+) (PRILOSEC) 20 mg capsule, Take one capsule by mouth daily before breakfast., Disp: 90 capsule, Rfl: 3  ???  VITAMIN D 1,250 mcg (50,000 unit) capsule, Take 1 capsule by mouth every 7 days., Disp: , Rfl:      Allergies:   Allergies   Allergen Reactions   ??? Bactrim [Sulfamethoxazole-Trimethoprim] NAUSEA AND VOMITING     Makes me feel ill   ??? Erythromycin NAUSEA AND VOMITING   ??? Macrobid [Nitrofurantoin Monohyd/M-Cryst] NAUSEA AND VOMITING   ??? Sulfa (Sulfonamide Antibiotics) NAUSEA AND VOMITING       Review of Systems Review of Systems   Constitutional: Negative.  Negative for activity change, fever and unexpected weight change.   HENT: Negative.  Negative for mouth sores.    Eyes: Negative.    Respiratory: Negative.  Negative for shortness of breath.    Cardiovascular: Negative.  Negative for palpitations.   Gastrointestinal: Negative.  Negative for abdominal pain and vomiting. Diarrhea: Improved with Imodium.   Endocrine: Negative.    Genitourinary: Negative.         Recurrent UTI improved with daily abx ppx.   Musculoskeletal: Negative.    Skin: Negative.  Negative for pallor.   Allergic/Immunologic: Negative.    Neurological: Negative.  Negative for dizziness and light-headedness.   Hematological: Negative.    Psychiatric/Behavioral: Negative.    All other systems reviewed and are negative.    Pain Score: 0     Pain Addressed: N/A    Patient Evaluated for a Clinical Trial: No treatment clinical trial available for this patient.    [place] Cooperative Oncology Group performance status is 0, Fully active, able to carry on all pre-disease performance without restriction.    Physical Exam  Vitals:    08/27/18 1100   BP: 125/84   BP Source: Arm, Left Upper   Patient Position:  Sitting   Pulse: 83   Resp: 18   Temp: 36.3 ???C (97.3 ???F)   TempSrc: Temporal   SpO2: 99%   Weight: 94.3 kg (207 lb 12.8 oz)   Height: 172.7 cm (68)   PainSc: Zero      Physical Exam   Constitutional: She is oriented to person, place, and time and well-developed, well-nourished, and in no distress. No distress.   Eyes: EOM are normal. No scleral icterus.   Cardiovascular: Normal rate, regular rhythm, normal heart sounds and intact distal pulses.   Pulmonary/Chest: Effort normal and breath sounds normal. No respiratory distress. She has no wheezes. She has no rales. Right breast exhibits no inverted nipple, no mass, no nipple discharge, no skin change and no tenderness. Left breast exhibits no inverted nipple, no mass, no nipple discharge, no skin change and no tenderness. Breasts are symmetrical.   Abdominal: Soft. She exhibits no distension. There is no abdominal tenderness.   Musculoskeletal:         General: No edema.   Lymphadenopathy:     She has no cervical adenopathy.     She has no axillary adenopathy.        Right: No supraclavicular adenopathy present.        Left: No supraclavicular adenopathy present.   Neurological: She is alert and oriented to person, place, and time. No cranial nerve deficit. She exhibits normal muscle tone. Gait normal. Coordination normal.   Skin: No rash noted. No pallor.   Psychiatric: Mood, memory, affect and judgment normal.   Vitals reviewed.         Labs/ Imaging /Pathology   None new.    Assessment & Plan:  Ms. Ingerson is a 63 year old female with the following medical problems:  ???  1.  T1b N0 M0 stage IB triple negative cancer of the RIGHT breast s/p neoadjuvant carboplatin/docetaxel and lumpectomy 06/2017 with complete response, radiation 09/2017.  Now on observation, 11 months out from completion of treatment with no clinical evidence of recurrence.  2.  Chronic mild thrombocytopenia.  Clinically insignificant and stable, present since 2013..    Current plan:  1.  Continue observation.  2.  Next mammogram 02/2019 at [place].  3.  Return to clinic in 3 months.    Thank you for the opportunity to participate in her care.    Parts of this note were created with voice recognition software. Please excuse any grammatical or typographical errors.

## 2018-09-04 ENCOUNTER — Encounter: Admit: 2018-09-04 | Discharge: 2018-09-04

## 2018-09-04 NOTE — Telephone Encounter
Attempted to reach pt to reschedule appt with Dr Becky Sax.  NA.  Left message for pt to contact office  Mychart message sent to pt  Appt rescheduled to 9/15 @ 3pm

## 2018-09-05 NOTE — Telephone Encounter
Pt left message that she missed a call.    Call placed --advised pt a my chart message was also sent advised that on her currently scheduled appointment on 9/1 needed to be rescheduled d/t dr. Hart Carwin availability to be seen on 9/15 at 3 pm at the [place] location. Per pt that will work--States thanks for the call.

## 2018-10-03 ENCOUNTER — Encounter: Admit: 2018-10-03 | Discharge: 2018-10-03

## 2018-10-03 DIAGNOSIS — K219 Gastro-esophageal reflux disease without esophagitis: Secondary | ICD-10-CM

## 2018-10-03 NOTE — Telephone Encounter
Refill request received for omeprazole  Last OV 12/11/2017  Last fill 12/11/2017 90 caps w/3 refills    Routing to Glenice Bow for approval/refusal    Pt is a former pt of Dr. Lanney Gins reassigned to Dr. Ebony Hail. Called and LDVM (authorization on file) for pt to call to schedule an appointment with Dr. Ebony Hail to establish care and that we will review the refill request in the next 24-48 hours.

## 2018-10-04 MED ORDER — OMEPRAZOLE 20 MG PO CPDR
ORAL_CAPSULE | Freq: Every day | 0 refills | Status: DC
Start: 2018-10-04 — End: 2019-01-29

## 2018-10-25 ENCOUNTER — Encounter: Admit: 2018-10-25 | Discharge: 2018-10-25

## 2018-10-25 NOTE — Telephone Encounter
Pt will need reschedule --needs to quarantine for 14 days--can offer 9/22 at 145

## 2018-10-25 NOTE — Telephone Encounter
Per Dr Becky Sax pt rescheduled

## 2018-10-25 NOTE — Telephone Encounter
Pt calling to see if she should keep appt on 9/15 because she tested + for COVID on 9/5.  Pt states she is feeling fine.  Skype message sent to Leda Gauze to see when pt can be rescheduled to

## 2018-10-30 ENCOUNTER — Encounter: Admit: 2018-10-30 | Discharge: 2018-10-30 | Payer: Private Health Insurance - Indemnity

## 2018-10-31 ENCOUNTER — Ambulatory Visit: Admit: 2018-10-31 | Discharge: 2018-10-31 | Payer: Private Health Insurance - Indemnity

## 2018-11-13 ENCOUNTER — Encounter

## 2018-11-13 DIAGNOSIS — I10 Essential (primary) hypertension: Secondary | ICD-10-CM

## 2018-11-13 DIAGNOSIS — N904 Leukoplakia of vulva: Secondary | ICD-10-CM

## 2018-11-13 DIAGNOSIS — K219 Gastro-esophageal reflux disease without esophagitis: Secondary | ICD-10-CM

## 2018-11-13 DIAGNOSIS — R1310 Dysphagia, unspecified: Secondary | ICD-10-CM

## 2018-11-13 DIAGNOSIS — D72819 Decreased white blood cell count, unspecified: Secondary | ICD-10-CM

## 2018-11-13 DIAGNOSIS — D693 Immune thrombocytopenic purpura: Secondary | ICD-10-CM

## 2018-11-13 DIAGNOSIS — C50919 Malignant neoplasm of unspecified site of unspecified female breast: Secondary | ICD-10-CM

## 2018-11-13 DIAGNOSIS — E785 Hyperlipidemia, unspecified: Secondary | ICD-10-CM

## 2018-11-13 DIAGNOSIS — Z0389 Encounter for observation for other suspected diseases and conditions ruled out: Secondary | ICD-10-CM

## 2018-11-13 DIAGNOSIS — K449 Diaphragmatic hernia without obstruction or gangrene: Secondary | ICD-10-CM

## 2018-11-13 DIAGNOSIS — Z9221 Personal history of antineoplastic chemotherapy: Secondary | ICD-10-CM

## 2018-11-13 DIAGNOSIS — Z9289 Personal history of other medical treatment: Secondary | ICD-10-CM

## 2018-11-13 MED ORDER — CLOBETASOL 0.05 % TP CREA
TOPICAL | 1 refills | Status: AC
Start: 2018-11-13 — End: 2019-08-27

## 2018-11-13 MED ORDER — ESTRADIOL 0.01 % (0.1 MG/GRAM) VA CREA
11 refills | 43.00000 days | Status: AC
Start: 2018-11-13 — End: ?

## 2018-11-13 NOTE — Progress Notes
Date of Service: 11/13/2018    Subjective:             Teresa Trevino is a 63 y.o. female.    History of Present Illness  Here for LS check. 2-3 flares this year. Uses clobetasol m/w/f. No pruritis,pain,bleeding,discharge. Recurrent utis esp during chemo. On daily keflex. Not using estrace cream. Forgot during chemo.       Review of Systems   Constitutional: Negative.  Negative for fatigue, fever and unexpected weight change.   HENT: Negative.    Eyes: Negative.    Respiratory: Negative.  Negative for cough and shortness of breath.    Cardiovascular: Negative.  Negative for chest pain and leg swelling.   Gastrointestinal: Negative.  Negative for abdominal pain, blood in stool, constipation, diarrhea, nausea and vomiting.   Endocrine: Negative.    Genitourinary: Negative.  Negative for difficulty urinating, dyspareunia, dysuria, enuresis, frequency, genital sores, hematuria, menstrual problem, pelvic pain, urgency, vaginal bleeding, vaginal discharge and vaginal pain.   Musculoskeletal: Negative.  Negative for arthralgias and back pain.   Skin: Negative.    Allergic/Immunologic: Negative.    Neurological: Negative.  Negative for light-headedness and headaches.   Hematological: Negative.  Negative for adenopathy. Does not bruise/bleed easily.   Psychiatric/Behavioral: Negative.  Negative for confusion. The patient is not nervous/anxious.    All other systems reviewed and are negative.      Medical History:   Diagnosis Date   ? Breast cancer (HCC) 01/2017    right IDC   ? Chronic leukopenia    ? Difficulty swallowing     past issue during chemotherapy-no longer an issue per pt   ? GERD (gastroesophageal reflux disease)    ? Hiatal hernia    ? History of blood transfusion     during chemo therapy   ? History of chemotherapy    ? History of EKG 01/2017   ? Hyperlipemia 08/20/2015   ? Hypertension    ? 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 Lichen sclerosus et atrophicus of the vulva 09/22/2015   ? Observation for suspected cardiovascular disease 08/20/2015    04/21/15  Stress Echo :  1.  No exercise induced chest pain.  2.  No ECG evidence of ischemia.  3.  No echocardiographic evidence of ischemia.  4.  Normal  Hemodynamic response to exercise.  5.  No significant exercise induced arrhythmias.  6.  Low risk scan.  7.  Consider medical management if indicated.       Surgical History:   Procedure Laterality Date   ? TONSILLECTOMY  1977   ? HX HYSTERECTOMY  2008   ? BLADDER SURGERY  2013    bladder sling   ? CT ANGIOGRAM (NON-INVASIVE)  09/2015   ? UPPER GASTROINTESTINAL ENDOSCOPY  05/26/2017   ? RIGHT RADIOACTIVE SEED LOCALIZED LUMPECTOMY Right 07/06/2017    Performed by Ruthy Dick, DO at [facility] OR   ? IDENTIFICATION SENTINEL LYMPH NODE Right 07/06/2017    Performed by Ruthy Dick, DO at [facility] OR   ? INJECTION RADIOACTIVE TRACER FOR SENTINEL NODE IDENTIFICATION Right 07/06/2017    Performed by Ruthy Dick, DO at [facility] OR   ? SENTINEL LYMPH NODE BIOPSY Right 07/06/2017    Performed by Ruthy Dick, DO at [facility] OR   ? COLONOSCOPY DIAGNOSTIC WITH SPECIMEN COLLECTION BY BRUSHING/ WASHING - FLEXIBLE N/A 02/01/2018    Performed by Virgina Organ, MD at [facility] OR   ?  ESOPHAGOGASTRODUODENOSCOPY WITH BIOPSY - FLEXIBLE N/A 02/01/2018    Performed by Virgina Organ, MD at [facility] OR   ? COLONOSCOPY     ? FOOT SURGERY         Social History     Tobacco Use   ? Smoking status: Never Smoker   ? Smokeless tobacco: Never Used   Substance Use Topics   ? Alcohol use: Yes     Alcohol/week: 4.0 standard drinks     Types: 2 Shots of liquor, 2 Standard drinks or equivalent per week     Frequency: 2-3 times a week     Drinks per session: 1 or 2     Binge frequency: Never     Comment: 2 drinks per week   ? Drug use: No       Family History   Problem Relation Age of Onset   ? Diabetes Mother    ? Hypertension Mother    ? High Cholesterol Mother    ? Stroke Mother ? Thyroid Disease Mother    ? Kidney Failure Mother         on HD   ? Diabetes Father         since age 68   ? Heart Failure Father    ? Diabetes Paternal Uncle    ? Cancer Maternal Grandmother    ? Diabetes Maternal Grandfather    ? None Reported Sister          Objective:         ? acetaminophen (TYLENOL PO) Take  by mouth as Needed.   ? cephalexin (KEFLEX) 250 mg capsule Take 250 mg by mouth daily. Maintenance for UTIs   ? [START ON 11/14/2018] clobetasoL (TEMOVATE) 0.05 % topical cream Apply  topically to affected area three times weekly.   ? estradioL (ESTRACE) 0.01 % (0.1 mg/g) vaginal cream Apply 0.5 gram to the urethral opening at night for 2 weeks then twice weekly   ? Lactobacillus rhamnosus GG (CULTURELLE PO) Take 1 tablet by mouth daily.   ? losartan (COZAAR) 50 mg tablet Take 50 mg by mouth daily.   ? MULTIVITAMIN PO Take 1 tablet by mouth daily.   ? omeprazole DR (PRILOSEC) 20 mg capsule TAKE 1 CAPSULE BY MOUTH EVERY DAY BEFORE BREAKFAST   ? VITAMIN D 1,250 mcg (50,000 unit) capsule Take 1 capsule by mouth every 7 days.     Vitals:    11/13/18 1014   BP: 131/74   Pulse: 91   Resp: 16   Temp: 36.7 ?C (98 ?F)   SpO2: 100%   Weight: 92.1 kg (203 lb)   Height: 172.7 cm (68)   PainSc: Zero     Body mass index is 30.87 kg/m?Marland Kitchen     Physical Exam  Normal external female genitalia  Vulva absent labia minora. 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 Continue clobetasol TIW  GSM with recurrent UTIs. Recommend use topical estrace cream BIW at vaginal entrance

## 2018-11-14 ENCOUNTER — Encounter: Admit: 2018-11-14 | Discharge: 2018-11-14 | Payer: Private Health Insurance - Indemnity

## 2018-11-14 ENCOUNTER — Ambulatory Visit: Admit: 2018-11-14 | Discharge: 2018-11-14 | Payer: Private Health Insurance - Indemnity

## 2018-11-14 DIAGNOSIS — C50311 Malignant neoplasm of lower-inner quadrant of right female breast: Secondary | ICD-10-CM

## 2018-11-14 DIAGNOSIS — Z0389 Encounter for observation for other suspected diseases and conditions ruled out: Secondary | ICD-10-CM

## 2018-11-14 DIAGNOSIS — C50919 Malignant neoplasm of unspecified site of unspecified female breast: Secondary | ICD-10-CM

## 2018-11-14 DIAGNOSIS — R1310 Dysphagia, unspecified: Secondary | ICD-10-CM

## 2018-11-14 DIAGNOSIS — N904 Leukoplakia of vulva: Secondary | ICD-10-CM

## 2018-11-14 DIAGNOSIS — I10 Essential (primary) hypertension: Secondary | ICD-10-CM

## 2018-11-14 DIAGNOSIS — E785 Hyperlipidemia, unspecified: Secondary | ICD-10-CM

## 2018-11-14 DIAGNOSIS — Z9221 Personal history of antineoplastic chemotherapy: Secondary | ICD-10-CM

## 2018-11-14 DIAGNOSIS — Z9289 Personal history of other medical treatment: Secondary | ICD-10-CM

## 2018-11-14 DIAGNOSIS — D693 Immune thrombocytopenic purpura: Secondary | ICD-10-CM

## 2018-11-14 DIAGNOSIS — K219 Gastro-esophageal reflux disease without esophagitis: Secondary | ICD-10-CM

## 2018-11-14 DIAGNOSIS — D72819 Decreased white blood cell count, unspecified: Secondary | ICD-10-CM

## 2018-11-14 DIAGNOSIS — K449 Diaphragmatic hernia without obstruction or gangrene: Secondary | ICD-10-CM

## 2018-11-14 NOTE — Progress Notes
Date: 11/14/2018       Teresa Trevino is a 63 y.o. female.     There were no encounter diagnoses.  Staging: Cancer Staging  Malignant neoplasm of lower-inner quadrant of right breast of female, estrogen receptor negative (HCC)  Staging form: Breast, AJCC 8th Edition  - Clinical stage from 01/19/2017: Stage IB (cT1b, cN0, cM0, G3, ER-, PR-, HER2-) - Signed by Dimas Alexandria, PA-C on 01/24/2017        History of Present Illness  63 year-old female with triple negative right breast cancer, stage IB (T1B N0 M0), status post neoadjuvant carboplatin/docetaxel and lumpectomy 06/2017 with complete response. She completed adjuvant radiation to the right breast to a total of 5,005 cGy in 20 fractions between 09/07/2017 and 10/05/2017.     Review of Systems   Constitutional: Negative.    HENT: Negative.    Eyes: Negative.    Respiratory: Negative.    Cardiovascular: Negative.    Gastrointestinal: Negative.    Endocrine: Negative.    Genitourinary: Negative.    Musculoskeletal: Negative.    Skin: Negative.    Allergic/Immunologic: Negative.    Neurological: Negative.    Hematological: Negative.    Psychiatric/Behavioral: Negative.          Subjective:          Cancer Staging  Malignant neoplasm of lower-inner quadrant of right breast of female, estrogen receptor negative (HCC)  Staging form: Breast, AJCC 8th Edition  - Clinical stage from 01/19/2017: Stage IB (cT1b, cN0, cM0, G3, ER-, PR-, HER2-) - Signed by Dimas Alexandria, PA-C on 01/24/2017    Teresa Trevino returns today for followup. She was last seen in our clinic on Jul 13 2018. On evaluation today she reports she is doing well.  She has an upcoming appointment with Dr. Freida Busman on December 10, 2018.  She is scheduled for her next mammogram and follow-up with her surgeon March 11, 2019.  She currently denies any headaches or dizziness, nausea or vomiting, chest pain, shortness of breath, cough, sputum changes, fevers, chills, night sweats.  She denies any breast pain or breast tenderness.  She reports a little bit of asymmetry with the right breast slightly smaller relative to the left.  She says sometimes the medial aspect and underside of her breast feels a little bit more dense but that it is not always the case.    Objective:         ? acetaminophen (TYLENOL PO) Take  by mouth as Needed.   ? cephalexin (KEFLEX) 250 mg capsule Take 250 mg by mouth daily. Maintenance for UTIs   ? clobetasoL (TEMOVATE) 0.05 % topical cream Apply  topically to affected area three times weekly.   ? estradioL (ESTRACE) 0.01 % (0.1 mg/g) vaginal cream Apply 0.5 gram to the urethral opening at night for 2 weeks then twice weekly   ? Lactobacillus rhamnosus GG (CULTURELLE PO) Take 1 tablet by mouth daily.   ? losartan (COZAAR) 50 mg tablet Take 50 mg by mouth daily.   ? MULTIVITAMIN PO Take 1 tablet by mouth daily.   ? omeprazole DR (PRILOSEC) 20 mg capsule TAKE 1 CAPSULE BY MOUTH EVERY DAY BEFORE BREAKFAST   ? VITAMIN D 1,250 mcg (50,000 unit) capsule Take 1 capsule by mouth every 7 days.     Vitals:    11/14/18 1141   BP: 122/81   Pulse: 90   Resp: 18   Temp: 36.7 ?C (98.1 ?F)   SpO2: 98%  Weight: 94.5 kg (208 lb 6.4 oz)   PainSc: Zero     Body mass index is 31.69 kg/m?Marland Kitchen     Pain Score: Zero        Fatigue Scale: 0-None    KARNOFSKY PERFORMANCE SCORE:  100% Normal, no complaints     Physical Exam     GENERAL: She is awake, alert, oriented and in no acute distress.  HEENT: Atraumatic, normocephalic, pupils are equal, round, reactive to light and accommodation. Normal conjunctivae. She is wearing a face mask that covers the lower portion of her face and nose.   LYMPH NODES: No palpable cervical and axillary lymphadenopathy bilaterally.No palpable supraclavicular lymphadenopathy bilaterally.  CARDIOVASCULAR: Regular rate and rhythm. No peripheral edema.  PULMONARY: Clear to auscultation bilaterally. No use of accessory muscles to breathe. BREASTS: The breasts were examined in the upright position with her expressed verbal consent.  On visual inspection the patient's breasts were grossly.  The surgical incision in the right breast is clean, dry, intact, well-healed, without evidence of active infection.  Mild dependent edema appreciated in the medial inferior aspect of the breast and laterally inferiorly as well.  No skin dimpling observed.  No palpable masses..  The left breast is soft, without palpable masses, nodules, lesions, ulcerations, skin dimpling, peau d'orange changes, nipple retraction, or discharge.  The bilateral axilla are without palpable adenopathy.  EXTREMITIES: No cyanosis or clubbing.  NEUROLOGIC: Alert and oriented x3. Cranial nerves intact as tested. No gross motor or sensory deficits.   MUSCULOSKELETAL: No tenderness with palpation of the cervical, thoracic or lumbar spine.   SKIN: No pallor or jaundice.  PSYCHIATRIC: Normal affect. Speech and behavior are appropriate. Judgment and insight are intact.    Imaging:  MAMMO DIAGNOSTIC BIL/TOMO  Narrative: Last mammogram was performed 1 year and 2 months ago.  Reason for exam: history of breast cancer, conservation therapy.    [id number] MAMMO DIAGNOSTIC BIL/TOMO: March 02, 2018 - ACCESSION   #: [id number]  2D/3D Procedure  3D Routine views.  2D Routine views.    There are scattered areas of fibroglandular density.  2D and 3D   tomosynthesis images were obtained. Post therapeutic changes are   present in the right breast. No suspicious findings are seen.    Electronically signed and approved by: Nicholos Johns, M.D.   [id number]  Impression: ASSESSMENT: BIRAD 2-Benign    RECOMMENDATION:  Routine screening mammogram in 1 year.         Assessment and Plan:  63 year-old female with triple negative right breast cancer, stage IB (T1B N0 M0), status post neoadjuvant carboplatin/docetaxel and lumpectomy 06/2017 with complete response. She completed adjuvant radiation to the right breast to a total of 5,005 cGy in 20 fractions between 09/07/2017 and 10/05/2017.     At this time, the patient is clinically stable, with no clinical evidence of disease recurrence or progression.   -There is a minimal amount of dependent edema we discussed massage techniques that should help with that as well as elevation and other interventions that may help.   -Our plan will be for the patient to return to our clinic in 4 months for continued follow-up.  I am happy to coordinate with her next appointment with Dr. Freida Busman in early 2021.    -The patient will keep herappointments with her surgeon and for next mammogram in January 2021.   - She may contact us in the interim if there are any questions or concerns requiring earlier assessment.  Elby Showers, MD    Parts of this note were created using voice recognition software.  Please excuse any grammatical or typographical errors.    I spent a total of 15 minutes today in direct patient evaluation with more than 50% spent in treatment plan formulation, counseling, symptom management discussion and consultation as outlined above.

## 2018-12-19 IMAGING — CR PELVIS
3 series · 3 of 3 positions shown · non-contrast
Comparison: none

[l-spine ap]
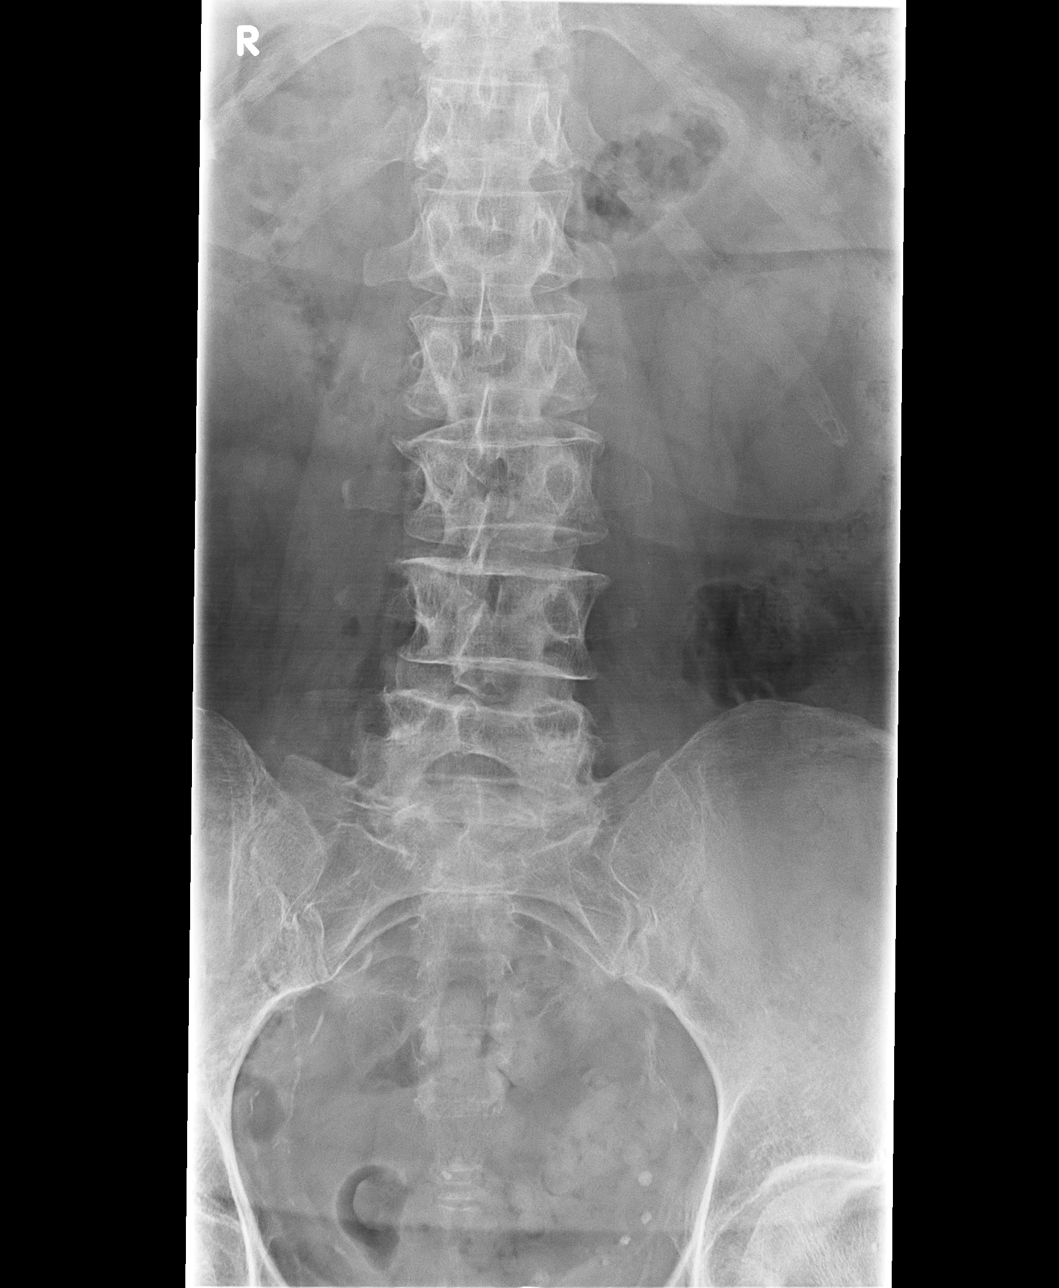

[l-spine lat]
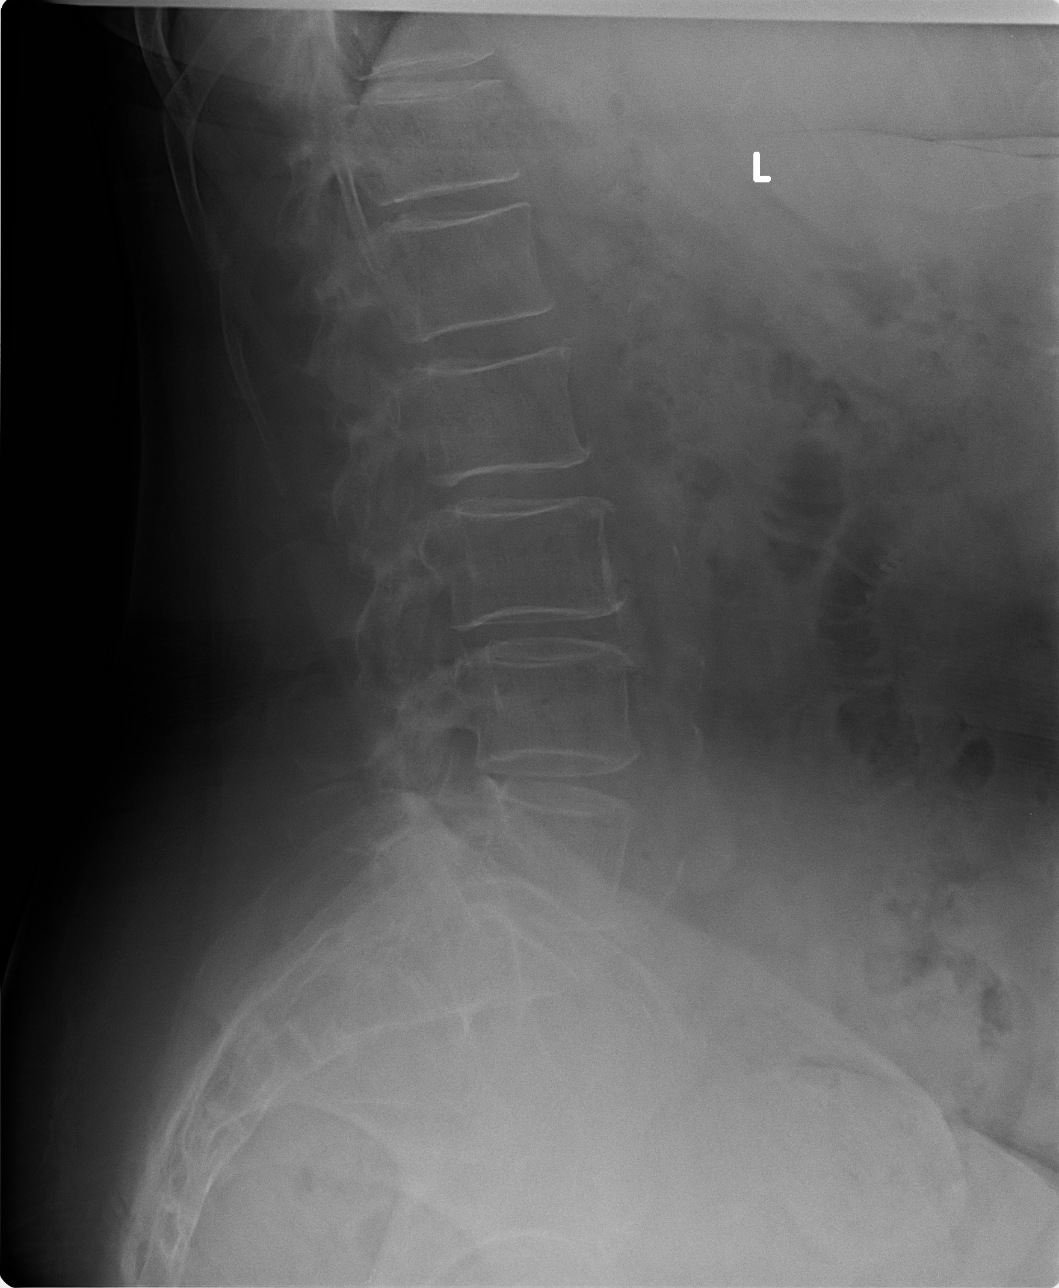

[l-spine l5-s1]
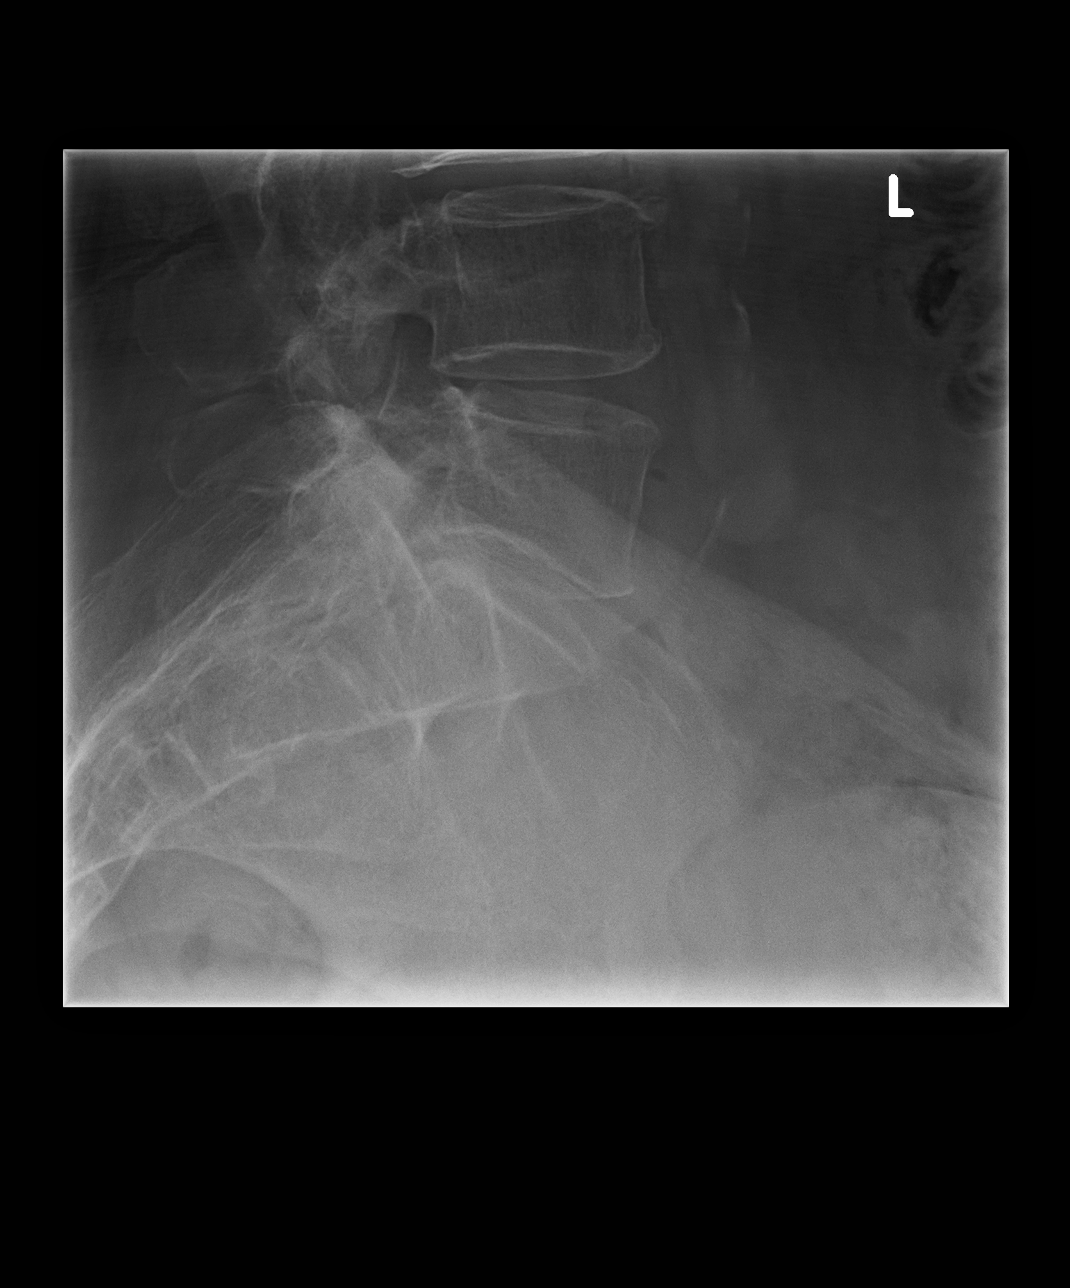

[3 of 3 positions shown; findings below may reference images not displayed]

EXAM

Lumbar spine.

INDICATION

back pain
PT c/o lumbar back pain radiating into Rt leg, PT states numbness into toes. PT states no known
injury. JC/ME

FINDINGS

Three views of the lumbar spine were obtained.

Vertebral body heights are normal.

There is mild disc space narrowing at L5-S1.

There are small osteophytes of vertebral body endplates of L2 through L5.

IMPRESSION

There are mild degenerative changes of the lumbar spine with no acute appearing abnormality.

Tech Notes:

PT c/o lumbar back pain radiating into Rt leg, PT states numbness into toes. PT states no known
injury. JC/ME

## 2018-12-20 ENCOUNTER — Encounter: Admit: 2018-12-20 | Discharge: 2018-12-20 | Payer: Private Health Insurance - Indemnity

## 2018-12-20 DIAGNOSIS — U071 COVID-19: Secondary | ICD-10-CM

## 2018-12-20 DIAGNOSIS — C50919 Malignant neoplasm of unspecified site of unspecified female breast: Secondary | ICD-10-CM

## 2018-12-20 DIAGNOSIS — K449 Diaphragmatic hernia without obstruction or gangrene: Secondary | ICD-10-CM

## 2018-12-20 DIAGNOSIS — C50311 Malignant neoplasm of lower-inner quadrant of right female breast: Secondary | ICD-10-CM

## 2018-12-20 DIAGNOSIS — Z9221 Personal history of antineoplastic chemotherapy: Secondary | ICD-10-CM

## 2018-12-20 DIAGNOSIS — I10 Essential (primary) hypertension: Secondary | ICD-10-CM

## 2018-12-20 DIAGNOSIS — D696 Thrombocytopenia, unspecified: Secondary | ICD-10-CM

## 2018-12-20 DIAGNOSIS — D693 Immune thrombocytopenic purpura: Secondary | ICD-10-CM

## 2018-12-20 DIAGNOSIS — Z9289 Personal history of other medical treatment: Secondary | ICD-10-CM

## 2018-12-20 DIAGNOSIS — N904 Leukoplakia of vulva: Secondary | ICD-10-CM

## 2018-12-20 DIAGNOSIS — Z0389 Encounter for observation for other suspected diseases and conditions ruled out: Secondary | ICD-10-CM

## 2018-12-20 DIAGNOSIS — E785 Hyperlipidemia, unspecified: Secondary | ICD-10-CM

## 2018-12-20 DIAGNOSIS — D72819 Decreased white blood cell count, unspecified: Secondary | ICD-10-CM

## 2018-12-20 DIAGNOSIS — K219 Gastro-esophageal reflux disease without esophagitis: Secondary | ICD-10-CM

## 2018-12-20 DIAGNOSIS — R1310 Dysphagia, unspecified: Secondary | ICD-10-CM

## 2018-12-20 NOTE — Progress Notes
12/20/2018    Name: Teresa Trevino  DOB: May 04, 1955  MRN: [id number]    Primary Care Physician: Rockwell Germany   Surgeon: Dr. Ruthy Dick  Radiation oncologist: Dr. Nash Dimmer     Chief Complaint:   Chief Complaint   Patient presents with   ? Heme/Onc Care     Hematology/Oncology History:  Cancer Staging  Malignant neoplasm of lower-inner quadrant of right breast of female, estrogen receptor negative (HCC)  Staging form: Breast, AJCC 8th Edition  - Clinical stage from 01/19/2017: Stage IB (cT1b, cN0, cM0, G3, ER-, PR-, HER2-) - Signed by Dimas Alexandria, PA-C on 01/24/2017    1.  History of chronic intermittent leukopenia and thrombocytopenia, clinically insignificant.  May have autoimmune component and appeared to be her baseline.  However, labs in 01/2017 were unremarkable.  2.  November 2018: Noted on screening mammogram to have 1.2 cm abnormality in the right breast.  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  3.  06/01/2017: Completed neoadjuvant carboplatin/docetaxel x6 with complete response obtained on breast MRI and ultrasound.  After cycle 1, decreased by 20% due to significant toxicity.  4.  07/06/2017: Lumpectomy (Dr. Loreta Ave) showed complete response with no active malignancy.  0/4 lymph nodes involved.  5.  10/05/2017: Completed radiation (Dr. Ardeth Perfect)  6.  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  W.  Current plan: Observation    EGD/colonoscopy 01/2018 ([facility]) negative, 10-year follow-up recommended  Mammogram 02/2018 ([facility]): Negative    Survivorship visit: Completed 06/2018    Interval Events  Teresa Trevino presents to the clinic today unaccompanied for follow-up of her breast cancer.  She recently had some back pain.  She reports that she saw her PCP who ordered an x-ray and this only showed degenerative changes.  She did have COVID-19 10/2018.  She really only had difficulty with taste and smell, otherwise no complaints.  She continues to have occasional zinging sensations in her right breast.  She has had no further UTIs with the Keflex prophylaxis.  She denies any other new changes to her health since our last visit.    Medical history  Teresa Trevino has a past medical history of Breast cancer (HCC) (01/2017) (right IDC), Chronic leukopenia, COVID-19 (10/2018), Difficulty swallowing (past issue during chemotherapy-no longer an issue per pt), GERD (gastroesophageal reflux disease), Hiatal hernia, History of blood transfusion (during chemo therapy), History of chemotherapy, History of EKG (01/2017), Hyperlipemia (08/20/2015), Hypertension, ITP (idiopathic thrombocytopenic purpura) (as a kid, resolved), Lichen sclerosus et atrophicus of the vulva (09/22/2015), and Observation for suspected cardiovascular disease (08/20/2015) (04/21/15  Stress Echo :  1.  No exercise induced chest pain.  2.  No ECG evidence of ischemia.  3.  No echocardiographic evidence of ischemia.  4.  Normal  Hemodynamic response to exercise.  5.  No significant exercise induced arrhythmias.  6.  Low risk scan.  7.  Consider medical management if indicated.).    She has a past surgical history that includes Bladder surgery (2013) (bladder sling); hysterectomy (2008); colonoscopy; Foot surgery; ct angiogram (non-invasive) (09/2015); Upper gastrointestinal endoscopy (05/26/2017); tonsillectomy (1977); Mastectomy, partial (Right, 07/06/2017) (RIGHT RADIOACTIVE SEED LOCALIZED LUMPECTOMY performed by Ruthy Dick, DO at [facility] OR/PERIOP); lymph node biopsy (Right, 07/06/2017) (SENTINEL LYMPH NODE BIOPSY performed by Ruthy Dick, DO at [facility] OR/PERIOP); Colonoscopy (N/A, 02/01/2018) (COLONOSCOPY DIAGNOSTIC WITH SPECIMEN COLLECTION BY BRUSHING/ WASHING - FLEXIBLE performed by Virgina Organ,  MD at [facility] OR/PERIOP); and Upper gastrointestinal endoscopy (N/A, 02/01/2018) (ESOPHAGOGASTRODUODENOSCOPY WITH BIOPSY - FLEXIBLE performed by Virgina Organ, MD at [facility] OR/PERIOP).    Teresa Trevino's family history includes Cancer in her maternal grandmother; Diabetes in her father, maternal grandfather, mother, and paternal uncle; Heart Failure in her father; High Cholesterol in her mother; Hypertension in her mother; Kidney Failure in her mother; None Reported in her sister; Stroke in her mother; Thyroid Disease in her mother.    Social history: Married.  Her husband is a semiretired Education officer, community who is president of the [place].  They have a condo in [place] which they enjoy and also rent out.  She has never been a smoker.  She rarely drinks alcohol, no drug use.        Medications    Current Outpatient Medications:   ?  cephalexin (KEFLEX) 250 mg capsule, Take 250 mg by mouth daily. Maintenance for UTIs, Disp: , Rfl:   ?  clobetasoL (TEMOVATE) 0.05 % topical cream, Apply  topically to affected area three times weekly., Disp: 30 g, Rfl: 1  ?  estradioL (ESTRACE) 0.01 % (0.1 mg/g) vaginal cream, Apply 0.5 gram to the urethral opening at night for 2 weeks then twice weekly, Disp: 42.5 g, Rfl: 11  ?  Lactobacillus rhamnosus GG (CULTURELLE PO), Take 1 tablet by mouth daily., Disp: , Rfl:   ?  losartan (COZAAR) 50 mg tablet, Take 50 mg by mouth daily., Disp: , Rfl:   ?  MULTIVITAMIN PO, Take 1 tablet by mouth daily., Disp: , Rfl:   ?  omeprazole DR (PRILOSEC) 20 mg capsule, TAKE 1 CAPSULE BY MOUTH EVERY DAY BEFORE BREAKFAST, Disp: 90 capsule, Rfl: 0  ?  rosuvastatin (CRESTOR) 5 mg tablet, Take 5 mg by mouth daily., Disp: , Rfl:   ?  VITAMIN D 1,250 mcg (50,000 unit) capsule, Take 1 capsule by mouth every 7 days., Disp: , Rfl:      Allergies:   Allergies   Allergen Reactions   ? Bactrim [Sulfamethoxazole-Trimethoprim] NAUSEA AND VOMITING     Makes me feel ill   ? Erythromycin NAUSEA AND VOMITING   ? Macrobid [Nitrofurantoin Monohyd/M-Cryst] NAUSEA AND VOMITING ? Sulfa (Sulfonamide Antibiotics) NAUSEA AND VOMITING       Review of Systems  Review of Systems   Constitutional: Negative.  Negative for activity change, fever and unexpected weight change.   HENT: Negative.  Negative for mouth sores.    Eyes: Negative.    Respiratory: Negative.  Negative for shortness of breath.    Cardiovascular: Negative.  Negative for palpitations.   Gastrointestinal: Negative.  Negative for abdominal pain and vomiting. Diarrhea: Improved with Imodium.   Endocrine: Negative.    Genitourinary: Negative.         Recurrent UTI improved with daily abx ppx.   Musculoskeletal: Positive for back pain.   Skin: Negative.  Negative for pallor.   Allergic/Immunologic: Negative.    Neurological: Negative.  Negative for dizziness and light-headedness.   Hematological: Negative.    Psychiatric/Behavioral: Negative.    All other systems reviewed and are negative.    Pain Score: 0     Pain Addressed: N/A    Patient Evaluated for a Clinical Trial: No treatment clinical trial available for this patient.    [place] Cooperative Oncology Group performance status is 0, Fully active, able to carry on all pre-disease performance without restriction.    Physical Exam  Vitals:    12/20/18 1610  PainSc: Zero      Physical Exam   Constitutional: She is oriented to person, place, and time and well-developed, well-nourished, and in no distress. No distress.   HENT:   Wearing mask   Eyes: EOM are normal. No scleral icterus.   Cardiovascular: Normal rate, regular rhythm, normal heart sounds and intact distal pulses.   Pulmonary/Chest: Effort normal and breath sounds normal. No respiratory distress. She has no wheezes. She has no rales. Right breast exhibits skin change (Radiation changes with thickened skin on inferior breast/nipple following radiation.  Unchanged.). Right breast exhibits no inverted nipple, no mass, no nipple discharge and no tenderness. Left breast exhibits no inverted nipple, no mass, no nipple discharge, no skin change and no tenderness. Breasts are symmetrical.   Abdominal: Soft. She exhibits no distension. There is no abdominal tenderness.   Musculoskeletal:         General: No edema.   Lymphadenopathy:     She has no cervical adenopathy.     She has no axillary adenopathy.        Right: No supraclavicular adenopathy present.        Left: No supraclavicular adenopathy present.   Neurological: She is alert and oriented to person, place, and time. No cranial nerve deficit. She exhibits normal muscle tone. Gait normal. Coordination normal.   Skin: No rash noted. No pallor.   Psychiatric: Mood, memory, affect and judgment normal.   Vitals reviewed.         Labs/ Imaging /Pathology   None new.    Assessment & Plan:  Ms. Buick is a 63 year old female with the following medical problems:  ?  1.  T1b N0 M0 stage IB triple negative cancer of the RIGHT breast s/p neoadjuvant carboplatin/docetaxel and lumpectomy 06/2017 with complete response, radiation 09/2017.  Now on observation, 14 months out from completion of treatment with no clinical evidence of recurrence.  2.  Chronic mild thrombocytopenia.  Clinically insignificant and stable, present since 2013.  Platelet count recently with her PCP was 120.    Current plan:  1.  Continue observation.  2.  Next mammogram, breast surgery, and radiation oncology follow-up planned 02/2019.    3.  Reassured her she is okay to receive the shingles vaccine.  4.  As she is seeing others in January, I will see her next in April 2021.    Thank you for the opportunity to participate in her care.    Parts of this note were created with voice recognition software. Please excuse any grammatical or typographical errors.

## 2019-01-15 ENCOUNTER — Encounter: Admit: 2019-01-15 | Discharge: 2019-01-15 | Payer: Private Health Insurance - Indemnity

## 2019-01-15 NOTE — Telephone Encounter
Teresa Trevino  63 year old female  [birth year]    Please send the following records for continuity of care:    Recent EKG and labs.    Please fax to:    Attention Consuela Mimes [phone]    Thank you,    Wilder Glade, LPN  [facility]  Cardiovascular Medicine  [place] [address]    Phone: [phone]  Fax: [phone]

## 2019-01-16 ENCOUNTER — Encounter: Admit: 2019-01-16 | Discharge: 2019-01-16 | Payer: Private Health Insurance - Indemnity

## 2019-01-16 ENCOUNTER — Ambulatory Visit: Admit: 2019-01-16 | Discharge: 2019-01-17 | Payer: Private Health Insurance - Indemnity

## 2019-01-16 DIAGNOSIS — N904 Leukoplakia of vulva: Secondary | ICD-10-CM

## 2019-01-16 DIAGNOSIS — Z9289 Personal history of other medical treatment: Secondary | ICD-10-CM

## 2019-01-16 DIAGNOSIS — D693 Immune thrombocytopenic purpura: Secondary | ICD-10-CM

## 2019-01-16 DIAGNOSIS — U071 COVID-19: Secondary | ICD-10-CM

## 2019-01-16 DIAGNOSIS — E785 Hyperlipidemia, unspecified: Secondary | ICD-10-CM

## 2019-01-16 DIAGNOSIS — K449 Diaphragmatic hernia without obstruction or gangrene: Secondary | ICD-10-CM

## 2019-01-16 DIAGNOSIS — R1310 Dysphagia, unspecified: Secondary | ICD-10-CM

## 2019-01-16 DIAGNOSIS — C50919 Malignant neoplasm of unspecified site of unspecified female breast: Secondary | ICD-10-CM

## 2019-01-16 DIAGNOSIS — K219 Gastro-esophageal reflux disease without esophagitis: Secondary | ICD-10-CM

## 2019-01-16 DIAGNOSIS — I10 Essential (primary) hypertension: Secondary | ICD-10-CM

## 2019-01-16 DIAGNOSIS — Z0389 Encounter for observation for other suspected diseases and conditions ruled out: Secondary | ICD-10-CM

## 2019-01-16 DIAGNOSIS — Z9221 Personal history of antineoplastic chemotherapy: Secondary | ICD-10-CM

## 2019-01-16 DIAGNOSIS — D72819 Decreased white blood cell count, unspecified: Secondary | ICD-10-CM

## 2019-01-16 NOTE — Telephone Encounter
01/16/2019 11:45 AM   EKG and labs received from PCP. Historical labs entered.  Wilder Glade, LPN

## 2019-01-16 NOTE — Telephone Encounter
01/16/2019 10:11 AM   Second attempt to request recent ekg and lab if recent. Message left on voicemail.  Wilder Glade, LPN

## 2019-01-16 NOTE — Patient Instructions
Teresa Trevino, it was nice talking with you today. I have summarized the recommendations made for you by Dr. Wynonia Lawman or Dr. Lew Dawes today. If you have any questions please send [name] a message in your MyChart or you may phone her at [phone].     1. Start psyllium husk, gummy fibers, Benefiber, Metamucil, Citrucel, or generic equivalent with goal of 15g of fiber (grams of dietary fiber per serving indicated on dietary lable) per day.  Please ensure you are reading the back of the bottle as this dose is higher than what is typically recommended of the over-the-counter supplement.  We do not recommend fiber tablets as they are only 500 mg and you would need up to 30 a day in order to get the desired dosing.  Increased gas/bloating can occur as a result of fiber; okay to reduce dose if this occurs.    2. Take metamucil daily    3. Repeat colonoscopy in 10 years.   ____________________________________________________    Please call Dr. Danella Sensing and Dr. Lahoma Rocker nurse [name] at [phone] if you have any questions or concerns.    General Instructions:  ? To have a medication refilled:  Please use the MyChart Refill request or contact your pharmacy directly to request medication refills.  Please allow 72 hours.   ? Radiology is on the 2nd floor of the Medical Office Building and the 2nd Floor of [facility]. Services also available at [facility] locationRadiology Scheduling can be reached at [phone]  ? To Schedule an office visits:  Call [phone]- [phone]  ? For procedure scheduling questions at the [facility] or [place] please call [phone]; for a procedure at [facility] please call [phone].  ? To receive appointment reminders on your cell phone: Make sure we have your cell phone number, and Text [place] to [phone].  ? Support for many chronic illnesses is available through [place]: SeekAlumni[employer]no or [phone]. ? For urgent questions on nights, weekends or holidays, call the Operator at [phone], and ask for the doctor on call for Gastroenterology. Call 911 for any emergencies.  ? Lab locations; Outpatient Lab Information  ? [facility])         [address]         [place], [place] [address]         8 a.m.-5 p.m., M-F    ? [facility] ( [employer])          [address]         [place], [place] [address]         [phone]         7 a.m.-5:30 p.m. Monday-Friday         7:00 a.m.-12:00 p.m. Sat-Sun    ? [facility]         [address]         [place], [place] [address]         Outpatient Lab located on 2nd floor         8 a.m.-5 p.m., M-F    ? [facility]  [address]  [place], [place]  At [employer] and [address],  adjacent to [employer] of [facility].        Can park in the [employer] (P2)        Outpatient Lab located on 1st floor  6:30 a.m.-6:00 p.m., Mon.        7:00 a.m. ? 6:00 p.m., Tues.-Fri.        7:00 a.m. ? 12:00 p.m., Sat    ? [facility] ? Appt Only (call [phone])  [address] [place], [place] [address]  8 a.m.-5 p.m., M-F    ? [facility] ?Appt Only (call [phone])  [address]  [place], [place] [address]  8 a.m.-5 p.m., M-F

## 2019-01-18 ENCOUNTER — Encounter: Admit: 2019-01-18 | Discharge: 2019-01-18

## 2019-01-18 ENCOUNTER — Ambulatory Visit: Admit: 2019-01-18 | Discharge: 2019-01-18 | Payer: Private Health Insurance - Indemnity

## 2019-01-18 DIAGNOSIS — D693 Immune thrombocytopenic purpura: Secondary | ICD-10-CM

## 2019-01-18 DIAGNOSIS — I2510 Atherosclerotic heart disease of native coronary artery without angina pectoris: Secondary | ICD-10-CM

## 2019-01-18 DIAGNOSIS — E785 Hyperlipidemia, unspecified: Secondary | ICD-10-CM

## 2019-01-18 DIAGNOSIS — N904 Leukoplakia of vulva: Secondary | ICD-10-CM

## 2019-01-18 DIAGNOSIS — U071 COVID-19: Secondary | ICD-10-CM

## 2019-01-18 DIAGNOSIS — D72819 Decreased white blood cell count, unspecified: Secondary | ICD-10-CM

## 2019-01-18 DIAGNOSIS — K449 Diaphragmatic hernia without obstruction or gangrene: Secondary | ICD-10-CM

## 2019-01-18 DIAGNOSIS — I10 Essential (primary) hypertension: Secondary | ICD-10-CM

## 2019-01-18 DIAGNOSIS — Z5181 Encounter for therapeutic drug level monitoring: Secondary | ICD-10-CM

## 2019-01-18 DIAGNOSIS — K219 Gastro-esophageal reflux disease without esophagitis: Secondary | ICD-10-CM

## 2019-01-18 DIAGNOSIS — Z9289 Personal history of other medical treatment: Secondary | ICD-10-CM

## 2019-01-18 DIAGNOSIS — R1310 Dysphagia, unspecified: Secondary | ICD-10-CM

## 2019-01-18 DIAGNOSIS — Z0389 Encounter for observation for other suspected diseases and conditions ruled out: Secondary | ICD-10-CM

## 2019-01-18 DIAGNOSIS — C50919 Malignant neoplasm of unspecified site of unspecified female breast: Secondary | ICD-10-CM

## 2019-01-18 DIAGNOSIS — E7800 Pure hypercholesterolemia, unspecified: Secondary | ICD-10-CM

## 2019-01-18 DIAGNOSIS — Z9221 Personal history of antineoplastic chemotherapy: Secondary | ICD-10-CM

## 2019-01-18 DIAGNOSIS — R931 Abnormal findings on diagnostic imaging of heart and coronary circulation: Secondary | ICD-10-CM

## 2019-01-18 MED ORDER — REPATHA SURECLICK 140 MG/ML SC PNIJ
140 mg | SUBCUTANEOUS | 11 refills | 28.00000 days | Status: DC
Start: 2019-01-18 — End: 2019-07-24

## 2019-01-18 NOTE — Progress Notes
Left a voicemail for Teresa Trevino regarding the Reptaha script that we just received today. No insurance on file, needing RX insurance info so I can process it.    Algis Downs, CPhT  Pharmacy Patient Advocate, Specialty Pharmacy  [phone]

## 2019-01-18 NOTE — Patient Instructions
Thank you for visiting our office today.    We recommend follow-up with our office in 6 months.      Your provider has made the following recommendations today:    1.  Start Repatha,  We will have our specialty pharmacy work on this for you.    2.  Get lab om 4 months (we will mail to you)        NOTE:  If your next follow-up with your physician is further out than 4 months, please call our scheduling department at [phone] about 4-6 months in advance,  as those schedules should be open and available then.        Thank you,  Gwinda Passe, RN  Clinical Nurse Coordinator    For Questions:  Please use the MyChart on-line portal (or mobile application) to send [place] a message.   We will use this portal to send results and messages to you from your physician. Or, contact our nurse line at [phone] and leave a message and we will return your call.       NOTE: MyChart messages and phone calls received on weekends, on holidays, and after 4 pm on weekdays will NOT be seen until the following business day. If you have an urgent matter during these times, please call [phone] to reach the on-call team.     Lab results and test results:  We will release lab results and most test results through MyChart once they are finalized and reviewed by our cardiologist.  If unable to share them through [place], our nursing staff will call you.    Medication Refills:  Contact your pharmacy or send [place] a message through [place].

## 2019-01-18 NOTE — Progress Notes
A prior authorization for Repatha is pending for Jillayne Blumenschein Milich.  The following additional information is required to complete the prior authorization:    The insurance plan requires a letter of medical necessity to be submitted along with chart notes and labs to their clinical team for review. They only receive these by fax or mail.    The specialty pharmacy team has contacted the provider's nurse Betsy L. to notify them of the above requirements.  The specialty team will submit the prior authorization once all of the information is complete.    [place] Patient Advocate  [phone]

## 2019-01-20 ENCOUNTER — Encounter: Admit: 2019-01-20 | Discharge: 2019-01-20

## 2019-01-20 DIAGNOSIS — N904 Leukoplakia of vulva: Secondary | ICD-10-CM

## 2019-01-20 DIAGNOSIS — D72819 Decreased white blood cell count, unspecified: Secondary | ICD-10-CM

## 2019-01-20 DIAGNOSIS — K449 Diaphragmatic hernia without obstruction or gangrene: Secondary | ICD-10-CM

## 2019-01-20 DIAGNOSIS — C50919 Malignant neoplasm of unspecified site of unspecified female breast: Secondary | ICD-10-CM

## 2019-01-20 DIAGNOSIS — K219 Gastro-esophageal reflux disease without esophagitis: Secondary | ICD-10-CM

## 2019-01-20 DIAGNOSIS — E785 Hyperlipidemia, unspecified: Secondary | ICD-10-CM

## 2019-01-20 DIAGNOSIS — Z0389 Encounter for observation for other suspected diseases and conditions ruled out: Secondary | ICD-10-CM

## 2019-01-20 DIAGNOSIS — R1310 Dysphagia, unspecified: Secondary | ICD-10-CM

## 2019-01-20 DIAGNOSIS — Z9221 Personal history of antineoplastic chemotherapy: Secondary | ICD-10-CM

## 2019-01-20 DIAGNOSIS — U071 COVID-19: Secondary | ICD-10-CM

## 2019-01-20 DIAGNOSIS — I10 Essential (primary) hypertension: Secondary | ICD-10-CM

## 2019-01-20 DIAGNOSIS — Z9289 Personal history of other medical treatment: Secondary | ICD-10-CM

## 2019-01-20 DIAGNOSIS — D693 Immune thrombocytopenic purpura: Secondary | ICD-10-CM

## 2019-01-24 ENCOUNTER — Encounter: Admit: 2019-01-24 | Discharge: 2019-01-24

## 2019-01-24 NOTE — Progress Notes
The Prior Authorization for Repatha was submitted for Teresa Trevino via fax. (Patient's plan requires [place] to submit a letter of medical necessity along with chart notes and that will be forwarded to the clinical dept for review).  Will continue to follow.    [place] Patient Advocate  [phone]

## 2019-01-29 ENCOUNTER — Encounter: Admit: 2019-01-29 | Discharge: 2019-01-29

## 2019-01-29 DIAGNOSIS — K219 Gastro-esophageal reflux disease without esophagitis: Secondary | ICD-10-CM

## 2019-01-29 MED ORDER — OMEPRAZOLE 20 MG PO CPDR
ORAL_CAPSULE | Freq: Every day | 1 refills | Status: DC
Start: 2019-01-29 — End: 2019-07-01

## 2019-01-29 NOTE — Telephone Encounter
Refill request received for omeprazole  Last OV 01/16/2019  Last fill 10/04/2018 90 caps w/0 refills    Routing to Glenice Bow, APRN for approval/refusal

## 2019-02-04 ENCOUNTER — Encounter: Admit: 2019-02-04 | Discharge: 2019-02-04

## 2019-03-01 ENCOUNTER — Encounter: Admit: 2019-03-01 | Discharge: 2019-03-01

## 2019-03-07 ENCOUNTER — Encounter: Admit: 2019-03-07 | Discharge: 2019-03-07

## 2019-03-15 ENCOUNTER — Encounter: Admit: 2019-03-15 | Discharge: 2019-03-15

## 2019-03-15 ENCOUNTER — Ambulatory Visit: Admit: 2019-03-15 | Discharge: 2019-03-15

## 2019-03-15 DIAGNOSIS — D72819 Decreased white blood cell count, unspecified: Secondary | ICD-10-CM

## 2019-03-15 DIAGNOSIS — C50919 Malignant neoplasm of unspecified site of unspecified female breast: Secondary | ICD-10-CM

## 2019-03-15 DIAGNOSIS — U071 COVID-19: Secondary | ICD-10-CM

## 2019-03-15 DIAGNOSIS — N904 Leukoplakia of vulva: Secondary | ICD-10-CM

## 2019-03-15 DIAGNOSIS — C50311 Malignant neoplasm of lower-inner quadrant of right female breast: Secondary | ICD-10-CM

## 2019-03-15 DIAGNOSIS — Z9289 Personal history of other medical treatment: Secondary | ICD-10-CM

## 2019-03-15 DIAGNOSIS — R1310 Dysphagia, unspecified: Secondary | ICD-10-CM

## 2019-03-15 DIAGNOSIS — K219 Gastro-esophageal reflux disease without esophagitis: Secondary | ICD-10-CM

## 2019-03-15 DIAGNOSIS — Z9221 Personal history of antineoplastic chemotherapy: Secondary | ICD-10-CM

## 2019-03-15 DIAGNOSIS — I10 Essential (primary) hypertension: Secondary | ICD-10-CM

## 2019-03-15 DIAGNOSIS — D693 Immune thrombocytopenic purpura: Secondary | ICD-10-CM

## 2019-03-15 DIAGNOSIS — Z0389 Encounter for observation for other suspected diseases and conditions ruled out: Secondary | ICD-10-CM

## 2019-03-15 DIAGNOSIS — K449 Diaphragmatic hernia without obstruction or gangrene: Secondary | ICD-10-CM

## 2019-03-15 DIAGNOSIS — E785 Hyperlipidemia, unspecified: Secondary | ICD-10-CM

## 2019-03-15 MED ORDER — MONTELUKAST 10 MG PO TAB
10 mg | ORAL_TABLET | Freq: Every evening | ORAL | 11 refills | 90.00000 days | Status: DC
Start: 2019-03-15 — End: 2019-08-06

## 2019-03-15 NOTE — Progress Notes
Radiation Oncology Follow Up Note  Date: 03/15/2019       Teresa Trevino is a 64 y.o. female.     The encounter diagnosis was Malignant neoplasm of lower-inner quadrant of right breast of female, estrogen receptor negative (HCC).  Staging: Cancer Staging  Malignant neoplasm of lower-inner quadrant of right breast of female, estrogen receptor negative (HCC)  Staging form: Breast, AJCC 8th Edition  - Clinical stage from 01/19/2017: Stage IB (cT1b, cN0, cM0, G3, ER-, PR-, HER2-) - Signed by Dimas Alexandria, PA-C on 01/24/2017        History of Present Illness  64 year-old female with triple negative right breast cancer, stage IB (T1B N0 M0), status post neoadjuvant carboplatin/docetaxel and lumpectomy 06/2017 with complete response. She completed adjuvant radiation to the right breast to a total of 5,005 cGy in 20 fractions between 09/07/2017 and 10/05/2017.     Review of Systems   Constitutional: Negative.    HENT: Negative.    Eyes: Negative.    Respiratory: Negative.    Cardiovascular: Negative.    Gastrointestinal: Negative.    Endocrine: Negative.    Genitourinary: Negative.    Musculoskeletal: Negative.    Skin: Negative.    Allergic/Immunologic: Negative.    Neurological: Negative.    Hematological: Negative.    Psychiatric/Behavioral: Negative.    All other systems reviewed and are negative.        Subjective:          Cancer Staging  Malignant neoplasm of lower-inner quadrant of right breast of female, estrogen receptor negative (HCC)  Staging form: Breast, AJCC 8th Edition  - Clinical stage from 01/19/2017: Stage IB (cT1b, cN0, cM0, G3, ER-, PR-, HER2-) - Signed by Dimas Alexandria, PA-C on 01/24/2017 Mrs. Brazill returns today for followup. She was last seen in our clinic on November 14, 2018. On evaluation today she reports she is doing well.  She has an upcoming appointment with Dr. Freida Busman in April, 2021. She is scheduled to have her next mammogram at [facility] in February, 2021, and she will be seen for follow-up in her surgeons clinic.  She currently denies any headaches or dizziness, nausea or vomiting, chest pain, shortness of breath, cough, sputum changes, fevers, chills, night sweats.  She denies any breast pain or breast tenderness.  She reports she thinks the right breast is continuing to become slightly smaller relative to the left.  She says sometimes the medial aspect and underside of her breast feels a little bit more dense but that it is not always the case.    Objective:         ? cephalexin (KEFLEX) 250 mg capsule Take 250 mg by mouth daily. Maintenance for UTIs   ? clobetasoL (TEMOVATE) 0.05 % topical cream Apply  topically to affected area three times weekly. (Patient taking differently: Apply  topically to affected area twice weekly.)   ? estradioL (ESTRACE) 0.01 % (0.1 mg/g) vaginal cream Apply 0.5 gram to the urethral opening at night for 2 weeks then twice weekly   ? evolocumab (REPATHA SURECLICK) 140 mg/mL injectable PEN Inject 1 mL under the skin every 14 days.   ? Lactobacillus rhamnosus GG (CULTURELLE PO) Take 1 tablet by mouth daily.   ? losartan (COZAAR) 50 mg tablet Take 50 mg by mouth daily.   ? montelukast (SINGULAIR) 10 mg tablet Take one tablet by mouth at bedtime daily. Indications: inflammation of the nose due to an allergy, fibrosis   ? MULTIVITAMIN PO Take 1  tablet by mouth daily.   ? omeprazole DR (PRILOSEC) 20 mg capsule TAKE 1 CAPSULE BY MOUTH EVERY DAY BEFORE BREAKFAST   ? rosuvastatin (CRESTOR) 5 mg tablet Take 5 mg by mouth. 3x weekly   ? VITAMIN D 1,250 mcg (50,000 unit) capsule Take 1 capsule by mouth every 7 days.     Vitals:    03/15/19 1109   BP: 112/71 Pulse: 85   Resp: 18   Temp: 36.4 ?C (97.5 ?F)   SpO2: 98%   Weight: 96.8 kg (213 lb 6.4 oz)   PainSc: Zero     Body mass index is 32.45 kg/m?Marland Kitchen     Pain Score: Zero        Fatigue Scale: 0-None    KARNOFSKY PERFORMANCE SCORE:  100% Normal, no complaints     Physical Exam     GENERAL: She is awake, alert, oriented and in no acute distress.  HEENT: Atraumatic, normocephalic, pupils are equal, round, reactive to light and accommodation. Normal conjunctivae. She is wearing a face mask that covers the lower portion of her face and nose.   LYMPH NODES: No palpable cervical and axillary lymphadenopathy bilaterally.No palpable supraclavicular lymphadenopathy bilaterally.  CARDIOVASCULAR: Regular rate and rhythm.  PULMONARY: Clear to auscultation bilaterally.  BREASTS: The breasts were examined in the upright position with her expressed verbal consent.  On visual inspection the patient's breasts were grossly.  The surgical incision in the right breast is clean, dry, intact, well-healed, without evidence of active infection.  Dependent edema appreciated in the medial inferior aspect of the breast and laterally inferiorly as well.  No skin dimpling or rash.The left breast is soft, without palpable masses, nodules, lesions, ulcerations, skin dimpling, peau d'orange changes, nipple retraction, or discharge.  The bilateral axilla are without palpable adenopathy.  EXTREMITIES: No cyanosis or clubbing.  NEUROLOGIC: Alert and oriented x3. Cranial nerves intact as tested. No gross motor or sensory deficits.   MUSCULOSKELETAL: No tenderness with palpation of the cervical, thoracic or lumbar spine.   SKIN: No pallor or jaundice.  PSYCHIATRIC: Normal affect. Speech and behavior are appropriate. Judgment and insight are intact.    Imaging:    MAMMO DIAGNOSTIC BIL/TOMO  Narrative: Last mammogram was performed 1 year and 2 months ago.  Reason for exam: history of breast cancer, conservation therapy. [id number] MAMMO DIAGNOSTIC BIL/TOMO: March 02, 2018 - ACCESSION   #: [id number]  2D/3D Procedure  3D Routine views.  2D Routine views.    There are scattered areas of fibroglandular density.  2D and 3D   tomosynthesis images were obtained. Post therapeutic changes are   present in the right breast. No suspicious findings are seen.    Electronically signed and approved by: Nicholos Johns, M.D.   [id number]  Impression: ASSESSMENT: BIRAD 2-Benign    RECOMMENDATION:  Routine screening mammogram in 1 year.         Assessment and Plan:  64 year-old female with triple negative right breast cancer, stage IB (T1B N0 M0), status post neoadjuvant carboplatin/docetaxel and lumpectomy 06/2017 with complete response. She completed adjuvant radiation to the right breast to a total of 5,005 cGy in 20 fractions between 09/07/2017 and 10/05/2017.     At this time, the patient is clinically stable, with no clinical evidence of disease recurrence or progression.   -Reviewed breast asymmetry and possible contribution of dependent edema. We discussed massage techniques that should help with that as well as elevation and other interventions that may help.Offered referral for lymphedema therapy.  Also, will add Singulair to help prevent scarring and fibrosis.  -Our plan will be for the patient to return to our clinic in June/July 2021 for continued follow-up.  She will keep her appointment with Dr. Freida Busman in April 2021  -The patient will keep her appointments for mammogram and surgeon followup in February 2021.   - She may contact us in the interim if there are any questions or concerns requiring earlier assessment.      Elby Showers, MD    Parts of this note were created using voice recognition software.  Please excuse any grammatical or typographical errors. I spent a total of 15 minutes today in direct patient evaluation with more than 50% spent in treatment plan formulation, counseling, symptom management discussion and consultation as outlined above.

## 2019-03-19 ENCOUNTER — Encounter: Admit: 2019-03-19 | Discharge: 2019-03-19

## 2019-03-19 DIAGNOSIS — C50311 Malignant neoplasm of lower-inner quadrant of right female breast: Secondary | ICD-10-CM

## 2019-03-29 ENCOUNTER — Encounter: Admit: 2019-03-29 | Discharge: 2019-03-29 | Payer: Private Health Insurance - Indemnity

## 2019-03-29 ENCOUNTER — Encounter: Admit: 2019-03-29 | Discharge: 2019-03-29

## 2019-03-29 DIAGNOSIS — Z1231 Encounter for screening mammogram for malignant neoplasm of breast: Secondary | ICD-10-CM

## 2019-03-29 NOTE — Telephone Encounter
Teresa Trevino had called asking about her upcoming apointment on Monday and if she needed to keep it with the weather. Her mammogram today looked good. We can cancel the appointment on Monday. She is seeing Dr. Zenia Resides in April who will perform a breast exam. I will schedule her BIS for the same day. If she has any new concerns, she will contact the office.    MAMMO SCREEN BILAT/TOMO/CAD: March 29, 2019 -   ACCESSION #: [id number]   3D Procedure   3D Routine views.   2D Synthetic Routine views.     Prior study comparison: March 02, 2018, bilateral [id number] MAMMO   DIAGNOSTIC BIL/TOMO performed at [facility]. Jul 04, 2017, right breast [id number] MAMMO DIAG RT   performed at [facility]. Jun 20, 2017, right breast [id number] MAMMO DIAGNOSTIC RT/TOMO performed at   [facility]. January 04, 2017, right   breast [id number] MAMMO DIAGNOSTIC RT/TOMO, performed at [facility]   [facility]. December 20, 2016, bilateral   [id number] DIGITAL MAMMO SCREEN BILAT/TOMO/CAD, performed at The   [facility]. January 11, 2016, bilateral   mammogram.     There are scattered areas of fibroglandular density. 3-D and   synthetic 2-D images of the bilateral breasts were obtained.     There are post therapeutic changes in the right breast. No new   suspicious masses, calcifications, or areas of architectural   distortion identified in either breast. There is no significant   change when compared with prior mammograms.       Finalized by Carmie End, M.D. on 03/29/2019 9:19 AM.   Dictated by Carmie End, M.D. on 03/29/2019 9:17 AM.       Electronically signed and approved by: Carmie End, M.D.   [phone]   IMPRESSION     ASSESSMENT: BIRAD 2-Benign         RECOMMENDATION:   Routine screening mammogram of both breasts in 1 year.     Mina Marble, PA-C

## 2019-04-10 ENCOUNTER — Encounter: Admit: 2019-04-10 | Discharge: 2019-04-10

## 2019-04-29 ENCOUNTER — Encounter: Admit: 2019-04-29 | Discharge: 2019-04-29

## 2019-05-22 ENCOUNTER — Encounter: Admit: 2019-05-22 | Discharge: 2019-05-22

## 2019-05-22 ENCOUNTER — Encounter: Admit: 2019-05-22 | Discharge: 2019-05-22 | Payer: Private Health Insurance - Indemnity

## 2019-05-22 DIAGNOSIS — D696 Thrombocytopenia, unspecified: Secondary | ICD-10-CM

## 2019-05-22 DIAGNOSIS — N904 Leukoplakia of vulva: Secondary | ICD-10-CM

## 2019-05-22 DIAGNOSIS — D72819 Decreased white blood cell count, unspecified: Secondary | ICD-10-CM

## 2019-05-22 DIAGNOSIS — Z0389 Encounter for observation for other suspected diseases and conditions ruled out: Secondary | ICD-10-CM

## 2019-05-22 DIAGNOSIS — C50919 Malignant neoplasm of unspecified site of unspecified female breast: Secondary | ICD-10-CM

## 2019-05-22 DIAGNOSIS — D693 Immune thrombocytopenic purpura: Secondary | ICD-10-CM

## 2019-05-22 DIAGNOSIS — Z9189 Other specified personal risk factors, not elsewhere classified: Secondary | ICD-10-CM

## 2019-05-22 DIAGNOSIS — C50311 Malignant neoplasm of lower-inner quadrant of right female breast: Secondary | ICD-10-CM

## 2019-05-22 DIAGNOSIS — Z9289 Personal history of other medical treatment: Secondary | ICD-10-CM

## 2019-05-22 DIAGNOSIS — Z9221 Personal history of antineoplastic chemotherapy: Secondary | ICD-10-CM

## 2019-05-22 DIAGNOSIS — E785 Hyperlipidemia, unspecified: Secondary | ICD-10-CM

## 2019-05-22 DIAGNOSIS — K449 Diaphragmatic hernia without obstruction or gangrene: Secondary | ICD-10-CM

## 2019-05-22 DIAGNOSIS — I10 Essential (primary) hypertension: Secondary | ICD-10-CM

## 2019-05-22 DIAGNOSIS — R1310 Dysphagia, unspecified: Secondary | ICD-10-CM

## 2019-05-22 DIAGNOSIS — K219 Gastro-esophageal reflux disease without esophagitis: Secondary | ICD-10-CM

## 2019-05-22 DIAGNOSIS — U071 COVID-19: Secondary | ICD-10-CM

## 2019-05-22 NOTE — Progress Notes
Reviewed BIS testing results from today  Results  L-Dex:-1.1 Right  Baseline1.3  Change from Baseline:-2.4     WNL less than 3 standard deviation increase from baseline.      Notified patient viaMyChart result was normal and to continue with routine follow up as scheduled.  Provided clinic contact information for any questions or concerns.

## 2019-05-23 ENCOUNTER — Encounter: Admit: 2019-05-23 | Discharge: 2019-05-23

## 2019-05-23 NOTE — Progress Notes
05/22/2019    Name: Teresa Trevino  DOB: 11/02/1955  MRN: [id number]    Primary Care Physician: Rockwell Germany   Surgeon: Dr. Ruthy Dick  Radiation oncologist: Dr. Nash Dimmer     Chief Complaint:   Chief Complaint   Patient presents with   ? Heme/Onc Care     Hematology/Oncology History:  Cancer Staging  Malignant neoplasm of lower-inner quadrant of right breast of female, estrogen receptor negative (HCC)  Staging form: Breast, AJCC 8th Edition  - Clinical stage from 01/19/2017: Stage IB (cT1b, cN0, cM0, G3, ER-, PR-, HER2-) - Signed by Dimas Alexandria, PA-C on 01/24/2017    1.  History of chronic intermittent leukopenia and thrombocytopenia, clinically insignificant.  May have autoimmune component and appeared to be her baseline.  However, labs in 01/2017 were unremarkable.  2.  November 2018: Noted on screening mammogram to have 1.2 cm abnormality in the right breast.  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  3.  06/01/2017: Completed neoadjuvant carboplatin/docetaxel x6 with complete response obtained on breast MRI and ultrasound.  After cycle 1, decreased by 20% due to significant toxicity.  4.  07/06/2017: Lumpectomy (Dr. Loreta Ave) showed complete response with no active malignancy.  0/4 lymph nodes involved.  5.  10/05/2017: Completed radiation (Dr. Ardeth Perfect)  6.  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  W.  Current plan: Observation    EGD/colonoscopy 01/2018 ([place]) negative, 10-year follow-up recommended  Mammogram 03/2019 ([facility]): Negative    Interval Events  Mrs. Mekeel presents to the clinic today unaccompanied for follow-up of her breast cancer.  She was able to receive the [employer] Covid vaccination 4/1.  She did not have any difficulty with it.  She has been to [place] recently to stay at their condo.  They also continue building a new house locally.  She denies any other changes to her health.  She has gained 40 pounds throughout her treatments, Covid, and is now going to start trying to exercise and lose weight again.    Medical history  Teresa Trevino has a past medical history of Breast cancer (HCC) (01/2017) (right IDC), Chronic leukopenia, COVID-19 (10/2018), Difficulty swallowing (past issue during chemotherapy-no longer an issue per pt), GERD (gastroesophageal reflux disease), Hiatal hernia, History of blood transfusion (during chemo therapy), History of chemotherapy, History of EKG (01/2017), Hyperlipemia (08/20/2015), Hypertension, ITP (idiopathic thrombocytopenic purpura) (as a kid, resolved), Lichen sclerosus et atrophicus of the vulva (09/22/2015), and Observation for suspected cardiovascular disease (08/20/2015) (04/21/15  Stress Echo :  1.  No exercise induced chest pain.  2.  No ECG evidence of ischemia.  3.  No echocardiographic evidence of ischemia.  4.  Normal  Hemodynamic response to exercise.  5.  No significant exercise induced arrhythmias.  6.  Low risk scan.  7.  Consider medical management if indicated.).    She has a past surgical history that includes Bladder surgery (2013) (bladder sling); hysterectomy (2008); colonoscopy; Foot surgery; ct angiogram (non-invasive) (09/2015); Upper gastrointestinal endoscopy (05/26/2017); tonsillectomy (1977); Mastectomy, partial (Right, 07/06/2017) (RIGHT RADIOACTIVE SEED LOCALIZED LUMPECTOMY performed by Ruthy Dick, DO at [facility] OR/PERIOP); lymph node biopsy (Right, 07/06/2017) (SENTINEL LYMPH NODE BIOPSY performed by Ruthy Dick, DO at [facility] OR/PERIOP); Colonoscopy (N/A, 02/01/2018) (COLONOSCOPY DIAGNOSTIC WITH SPECIMEN COLLECTION BY BRUSHING/ WASHING - FLEXIBLE performed by Virgina Organ, MD at [facility] OR/PERIOP); and Upper gastrointestinal endoscopy (N/A, 02/01/2018) (ESOPHAGOGASTRODUODENOSCOPY WITH  BIOPSY - FLEXIBLE performed by Virgina Organ, MD at [facility] OR/PERIOP).    [name]'s family history includes Cancer in her maternal grandmother; Diabetes in her father, maternal grandfather, mother, and paternal uncle; Heart Failure in her father; High Cholesterol in her mother; Hypertension in her mother; Kidney Failure in her mother; None Reported in her sister; Stroke in her mother; Thyroid Disease in her mother.    Social history: Married.  Her husband is a semiretired Education officer, community who is president of the [place].  They have a condo in [place] which they enjoy and also rent out.  She has never been a smoker.  She rarely drinks alcohol, no drug use.        Medications    Current Outpatient Medications:   ?  cephalexin (KEFLEX) 250 mg capsule, Take 250 mg by mouth daily. Maintenance for UTIs, Disp: , Rfl:   ?  clobetasoL (TEMOVATE) 0.05 % topical cream, Apply  topically to affected area three times weekly. (Patient taking differently: Apply  topically to affected area twice weekly.), Disp: 30 g, Rfl: 1  ?  estradioL (ESTRACE) 0.01 % (0.1 mg/g) vaginal cream, Apply 0.5 gram to the urethral opening at night for 2 weeks then twice weekly, Disp: 42.5 g, Rfl: 11  ?  evolocumab (REPATHA SURECLICK) 140 mg/mL injectable PEN, Inject 1 mL under the skin every 14 days., Disp: 2 mL, Rfl: 11  ?  Lactobacillus rhamnosus GG (CULTURELLE PO), Take 1 tablet by mouth daily., Disp: , Rfl:   ?  losartan (COZAAR) 50 mg tablet, Take 50 mg by mouth daily., Disp: , Rfl:   ?  montelukast (SINGULAIR) 10 mg tablet, Take one tablet by mouth at bedtime daily. Indications: inflammation of the nose due to an allergy, fibrosis, Disp: 30 tablet, Rfl: 11  ?  MULTIVITAMIN PO, Take 1 tablet by mouth daily., Disp: , Rfl:   ?  omeprazole DR (PRILOSEC) 20 mg capsule, TAKE 1 CAPSULE BY MOUTH EVERY DAY BEFORE BREAKFAST, Disp: 90 capsule, Rfl: 1  ?  rosuvastatin (CRESTOR) 5 mg tablet, Take 5 mg by mouth. 3x weekly, Disp: , Rfl:   ?  VITAMIN D 1,250 mcg (50,000 unit) capsule, Take 1 capsule by mouth every 7 days., Disp: , Rfl:      Allergies:   Allergies Allergen Reactions   ? Bactrim [Sulfamethoxazole-Trimethoprim] NAUSEA AND VOMITING     Makes me feel ill   ? Erythromycin NAUSEA AND VOMITING   ? Macrobid [Nitrofurantoin Monohyd/M-Cryst] NAUSEA AND VOMITING   ? Sulfa (Sulfonamide Antibiotics) NAUSEA AND VOMITING       Review of Systems  Review of Systems   Constitutional: Negative.  Negative for activity change, fever and unexpected weight change.   HENT: Negative.  Negative for mouth sores.    Eyes: Negative.    Respiratory: Negative.  Negative for shortness of breath.    Cardiovascular: Negative.  Negative for palpitations.   Gastrointestinal: Negative.  Negative for abdominal pain and vomiting. Diarrhea: Improved with Imodium.   Endocrine: Negative.    Genitourinary: Negative.         Recurrent UTI improved, now trying to remain off prophylactic antibiotics.   Musculoskeletal: Negative.    Skin: Negative.  Negative for pallor.   Allergic/Immunologic: Negative.    Neurological: Negative.  Negative for dizziness and light-headedness.   Hematological: Negative.    Psychiatric/Behavioral: Negative.    All other systems reviewed and are negative.    Pain Score: 0     Pain  Addressed: N/A    Patient Evaluated for a Clinical Trial: No treatment clinical trial available for this patient.    [place] Cooperative Oncology Group performance status is 0, Fully active, able to carry on all pre-disease performance without restriction.    Physical Exam  Vitals:    05/22/19 1057 05/22/19 1059 05/22/19 1100   BP: 114/72     Pulse: 83     Resp: 18     Temp: 36.5 ?C (97.7 ?F)     TempSrc: Temporal Temporal    SpO2: 97%     Weight: 96.6 kg (213 lb)     Height: 172.7 cm (68)     PainSc: Zero  Zero      Physical Exam   Constitutional: She is oriented to person, place, and time and well-developed, well-nourished, and in no distress. No distress.   HENT:   Wearing mask   Eyes: EOM are normal. No scleral icterus.   Cardiovascular: Normal rate, regular rhythm, normal heart sounds and intact distal pulses.   Pulmonary/Chest: Effort normal and breath sounds normal. No respiratory distress. She has no wheezes. She has no rales. Right breast exhibits skin change (Radiation changes with thickened skin on inferior breast/nipple following radiation.  Unchanged.). Right breast exhibits no inverted nipple, no mass, no nipple discharge and no tenderness. Left breast exhibits no inverted nipple, no mass, no nipple discharge, no skin change and no tenderness. Breasts are symmetrical.   Abdominal: Soft. She exhibits no distension. There is no abdominal tenderness.   Musculoskeletal:         General: No edema.   Lymphadenopathy:     She has no cervical adenopathy.     She has no axillary adenopathy.        Right: No supraclavicular adenopathy present.        Left: No supraclavicular adenopathy present.   Neurological: She is alert and oriented to person, place, and time. No cranial nerve deficit. She exhibits normal muscle tone. Gait normal. Coordination normal.   Skin: No rash noted. No pallor.   Psychiatric: Mood, memory, affect and judgment normal.   Vitals reviewed.         Labs/ Imaging /Pathology   Mammogram reviewed.    Assessment & Plan:  Ms. Caracci is a 64 year old female with the following medical problems:  ?  1.  T1b N0 M0 stage IB triple negative cancer of the RIGHT breast s/p neoadjuvant carboplatin/docetaxel and lumpectomy 06/2017 with complete response, radiation 09/2017.  Now on observation, 20 months out from completion of treatment with no clinical evidence of recurrence.  2.  Chronic mild thrombocytopenia.  Clinically insignificant and stable, present since 2013.      Current plan:  1.  Continue observation.  2.  Next mammogram anticipated 2/22 at [place].  3.  She will see Dr. Isabella Stalling 07/2019, so I will see her again in 6 months and will likely continue a 72-month follow-up afterwards.    Thank you for the opportunity to participate in her care.    Parts of this note were created with voice recognition software. Please excuse any grammatical or typographical errors.

## 2019-05-23 NOTE — Telephone Encounter
Contacted pt as labs should be due after started Repatha ordered in 01/2019.  Pt tells me that she never heard about the repatha.  She said someone called and said they would check on her insurance but she had not heard back.      She tells me that she prefers to see what her labs are before pursuing that again.  She states she has been tolerating the 5mg  of crestor 3 times a week.  She is due for labs in May.

## 2019-06-26 ENCOUNTER — Encounter: Admit: 2019-06-26 | Discharge: 2019-06-26

## 2019-07-01 ENCOUNTER — Encounter: Admit: 2019-07-01 | Discharge: 2019-07-01

## 2019-07-01 DIAGNOSIS — K219 Gastro-esophageal reflux disease without esophagitis: Secondary | ICD-10-CM

## 2019-07-01 MED ORDER — OMEPRAZOLE 20 MG PO CPDR
ORAL_CAPSULE | Freq: Every day | 1 refills | Status: AC
Start: 2019-07-01 — End: ?

## 2019-07-01 NOTE — Telephone Encounter
Refill request received for omeprazole   Last OV 01/16/2019  Last fill 01/29/2019 90 caps w/1 refill    Routing to Volney American, APRN for approval/refusal

## 2019-07-24 ENCOUNTER — Encounter: Admit: 2019-07-24 | Discharge: 2019-07-24

## 2019-07-24 DIAGNOSIS — K449 Diaphragmatic hernia without obstruction or gangrene: Secondary | ICD-10-CM

## 2019-07-24 DIAGNOSIS — I2510 Atherosclerotic heart disease of native coronary artery without angina pectoris: Secondary | ICD-10-CM

## 2019-07-24 DIAGNOSIS — D693 Immune thrombocytopenic purpura: Secondary | ICD-10-CM

## 2019-07-24 DIAGNOSIS — R1310 Dysphagia, unspecified: Secondary | ICD-10-CM

## 2019-07-24 DIAGNOSIS — Z5181 Encounter for therapeutic drug level monitoring: Secondary | ICD-10-CM

## 2019-07-24 DIAGNOSIS — Z9289 Personal history of other medical treatment: Secondary | ICD-10-CM

## 2019-07-24 DIAGNOSIS — Z0389 Encounter for observation for other suspected diseases and conditions ruled out: Secondary | ICD-10-CM

## 2019-07-24 DIAGNOSIS — U071 COVID-19: Secondary | ICD-10-CM

## 2019-07-24 DIAGNOSIS — E7800 Pure hypercholesterolemia, unspecified: Secondary | ICD-10-CM

## 2019-07-24 DIAGNOSIS — I10 Essential (primary) hypertension: Secondary | ICD-10-CM

## 2019-07-24 DIAGNOSIS — N904 Leukoplakia of vulva: Secondary | ICD-10-CM

## 2019-07-24 DIAGNOSIS — C50919 Malignant neoplasm of unspecified site of unspecified female breast: Secondary | ICD-10-CM

## 2019-07-24 DIAGNOSIS — E785 Hyperlipidemia, unspecified: Secondary | ICD-10-CM

## 2019-07-24 DIAGNOSIS — K219 Gastro-esophageal reflux disease without esophagitis: Secondary | ICD-10-CM

## 2019-07-24 DIAGNOSIS — D72819 Decreased white blood cell count, unspecified: Secondary | ICD-10-CM

## 2019-07-24 DIAGNOSIS — R931 Abnormal findings on diagnostic imaging of heart and coronary circulation: Secondary | ICD-10-CM

## 2019-07-24 DIAGNOSIS — Z9221 Personal history of antineoplastic chemotherapy: Secondary | ICD-10-CM

## 2019-07-24 MED ORDER — REPATHA SURECLICK 140 MG/ML SC PNIJ
140 mg | SUBCUTANEOUS | 11 refills | 28.00000 days | Status: AC
Start: 2019-07-24 — End: ?
  Filled 2019-07-24: qty 2, 28d supply
  Filled 2019-07-25: qty 2, 28d supply, fill #1

## 2019-07-24 NOTE — Progress Notes
Date of Service: 07/24/2019    Teresa Trevino is a 64 y.o. female.       HPI     Teresa Trevino was seen in our office today for routine follow-up.  She is a 64 year old female followed in office by Dr. Bary Richard.  The patient has a medical history significant for minimal  coronary artery disease, hypertension, hyperlipidemia, familial hypercholesterolemia, risk factors for CAD, breast cancer.  She initially presented with an abnormal CT calcium score and a CT coronary angiogram revealed moderate plaque in the LAD and mild disease in her left circumflex.  We therefore prescribed statin to control her cholesterol.  Her cholesterol is not been well controlled with statins and she has an intolerance to high-dose statins.  She therefore is working on getting PCSK9 inhibitors.  She is also followed for carcinoma of the breast treated with radiation therapy and Taxotere and carboplatin which do not typically cause cardiotoxicity.  An echocardiogram after therapy demonstrated normal left-ventricular function and we do not believe we need to be concerned about cardiotoxicity.     Her cholesterol control was very good on statins, however she did not tolerate these drugs due to myalgias and back pain.  Her family doctor arrange for her to have a PCSK9 inhibitor, however she ultimately decided she had had enough injections after her cancer treatment and is now back on rosuvastatin 5 mg 3 times a week.  She has minimal soreness in her muscles but is tolerating it.  Her cholesterol, however is not at target.  She has minimal coronary artery disease and risk factors and therefore needs to have her LDL below 70.  Current LDL cholesterol is 119 with a  HDL of 40, triglycerides 85 and total cholesterol 176.  Her non-HDL cholesterol is also markedly elevated at 136.    She was last seen in our office on 01/18/2019 by Dr. Doristine Counter.  Given that her LDL was above target at 119 mg/dL on rosuvastatin 5 mg 3 times weekly, Dr. Doristine Counter recommended that she get back on a PCSK9 inhibitor continue on rosuvastatin 5 mg 3 times weekly, after discussion the patient and her husband agreed with this.    Today the patient reports that she is feeling well.  She denies cardiac symptoms and is stable from a cardiac standpoint.  She is not having chest pain, shortness of breath, exertional dyspnea, PND, orthopnea, lower extremity edema, palpitations, dizziness/lightheadedness, near syncope and syncope.  She has been unable to tolerate higher doses of statin,  but is not having myalgias or muscle cramping on rosuvastatin 3 times weekly.  When she had her laboratories checked in November 2020 her LDL was 119 mg/dL and she reports she had just restarted low-dose Crestor prior to having the labs drawn.  Repatha was prescribed at her last visit with Dr. Doristine Counter.  Paperwork was submitted and was notified by her insurance company that it was approved, however she tells me today that no one got in touch with her and she was never sent the prescription so she did not start the Repatha. Today her blood pressure is 142/86, pulse 90 bpm.  She believes that she forgot to take her medications prior to her visit this morning.  She has been working on on a cardiac healthy diet but tells me she still has room for improvement.  She is usually splitting meals with her husband when they eat out.  She is eating red meat only once or twice a week.  She has cut down on her sweets to once a week.  She drinks 1-2 alcoholic beverages on the weekends, usually this is a hard seltzer or Kahl?a and diet pop.  She notes some bladder issues and has an appointment with urology at [facility] on 08/06/2019.  She does have a history of recurrent UTIs and is on Cipro routinely.  She is taking a probiotic daily.     Laboratories performed on 06/18/2019 (on rosuvastatin 5 mg 3 times weekly) showed sodium 140, potassium 3.9, chloride 106, BUN 17, creatinine 0.78, glucose 103 liver function test within normal limits with AST 17, AST 20.  Total cholesterol is 164 mg/dL, triglycerides 161 mg/dL, HDL 41 mg/dL, LDL 096 mg/dL.    Assessment and Plan:  1.  Familial hypercholesterolemia.  Treated. Her untreated cholesterol was 236 with an LDL of 172  and HDL of 37.  Treatment with low-dose statin , rosuvastatin 5 mg 3 times weekly was improved but not adequate with LDL 119 in November 2020, therefore a PCSK9 inhibitor, Repatha 140 mg every 14 days,  was added at her last visit in December.  Unfortunately, the Repatha did not get started although patient states that her insurance company approved it.  I am not sure where the breakdown occurred but we will go ahead and restart her on Repatha.  She is a bit reluctant to add another medication, however after a lengthy discussion today she is in agreement and will try the Repatha. Today we did talk about the option of trying acetamide, however she has had some GI issues in the past and is reluctant to take any medication with potential side effect of loose stools.  2.  Statin intolerance.  She has developed severe myalgias with rosuvastatin and atorvastatin.  With her severe hypercholesterolemia we do not anticipate that the less potent drugs will have adequate effectiveness.  She did have an excellent response to high-dose atorvastatin but did not tolerate.  She is tolerating low-dose rosuvastatin 5 mg 3 times weekly without myalgias or arthralgias.    3.  Coronary artery disease.  She has mild disease on CT coronary angiogram involving LAD and mild plaque in circumflex.  Normal LV systolic function, EF 60% by echo performed on 07/17/2017.  She is not having chest pain or angina symptoms.  4.  Carcinoma breast post chemotherapy and radiation therapy to right chest.  No cardiotoxicity from drugs  5.  Obesity.  BMI 31.7.    -Start Repatha 140 mg subcutaneous injection every 14 days.  ?Continue current dose of rosuvastatin 5 mg 3 times weekly.  -Repeat fasting lipid profile, AST and AST in 3 months or after 6 doses of the Repatha.  -Asked patient to check her blood pressure once or twice daily and keep a diary.  ?Reviewed and discussed blood pressure goals, target less than 135/80.  ?Instructed to contact our office if she is often having systolic readings above 140 or diastolic above 90.  -Asked patient to call our office in 2 to 3 weeks with her blood pressure readings.  -Risk factors modification  ?Recommend weight reduction.  Suggested weight watchers diet.   ?Reviewed discussed importance of a healthy left including a low-fat, low triglyceride, low sugar, low carbohydrate, low sodium diet and exercise.  -We had a long discussion today about a cardiac healthy diet.  ?Limit alcohol to 1 drink a day (12 ounce can of beer, 4 to 5 ounces of wine or 2 ounces of liquor).  -Encouraged to keep  up with her regular exercise program and get in at least 30 minutes of brisk walking/moderate aerobic exercise 5 days a week.  Asked patient to gradually increase the duration of her exercise up to 45 minutes a day to help with weight loss efforts.    Follow-up: Follow-up with Tyquavious Gamel Valli Glance, nurse practitioner in 3 months in our [facility] clinic with an updated fasting lipid profile prior to visit.  The patient is encouraged to contact our office if she has problems prior to next visit.    I have educated the patient on the plan of care today. Patient verbally expressed understanding and agreement with the plan. Specific instructions are outlined in the after visit summary document.      Thank you for the opportunity to participate in this pleasant patient's care. Please don't hesitate to contact me with any concerns or questions.     Patient education/counseling today, topics included: medication instructions, blood pressure monitoring, blood pressure goals,  daily weights, diet/sodium restriction, exercise, reviewed lab results,reviewed test results.  follow-up plan. Reviewed hospital records at the time of the patient's visit today.      DISEASE PREVENTION:   Lipids/Statin treatment:  Yes.   Currently on  rosuvastatin 5 mg three times weekly for Goal LDL < 70, Triglycerides < 478 mg/dl.  Lipid profile 06/18/2019: Total cholesterol is 164 mg/dL, triglycerides 295 mg/dL, HDL 41 mg/dL, LDL 621 mg/dL.     HTN: Yes. Controlled on current medical therapy, borderline in the office today.  Goal Systolic < 135, diastolic < 80.  Blood pressure today is 142/86.    Diabetes: No. .      Obesity/Nutrition/Physical Activity: BMI 31.7.Counseled regarding healthy diet, portion control, with a goal of weight loss.                DRB  Vitals:    07/24/19 1100   BP: (!) 142/86   BP Source: Arm, Left Upper   Patient Position: Sitting   Pulse: 90   SpO2: 96%   Weight: 94.8 kg (209 lb)   Height: 1.727 m (5' 8)   PainSc: Zero     Body mass index is 31.78 kg/m?Marland Kitchen     Past Medical History  Patient Active Problem List    Diagnosis Date Noted   ? Gastroesophageal reflux disease 05/11/2017   ? Essential hypertension 03/02/2017   ? Thrombocytopenia (HCC) 02/16/2017   ? Coronary artery disease involving native coronary artery of native heart without angina pectoris 01/30/2017     08/2007 Exercise sestamibi [facility] [place].  5.57min o9n TM, no CP. EF 59%, no ischemia  03/17  Ex Echo at [facility] - no ischemia, 6 min on TM, TDS, no CP, no ECG changes, EF normal  06/17  Coronary Artery AJ-130 Score: Total 234.  LAD 206, RCA 28  08/17 CT Cor angiogram at [place] EF 56%, chambers and valves normal, LAD-moderate concentric flat plaque, FFR 0.86; LCx mild plaque, RCA normal.  Thoracic aorta normal.     ? 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 Ms. Manwarren had no complaints prior to her screening mammogram. A new right breast asymmetry was  present on screening mammogram. There are was suspicious on diagnostic imaging and biopsy was recommended. Right breast sono-guided biopsy 01/19/17 ([place]) revealed grade 3 invasive ductal carcinoma. Ms. Fogal underwent right RSL lumpectomy/SLNB on 07/06/17. She finished radiation with Dr. Ardeth Perfect on 10/05/17.    PATHOLOGY:  Tumor:  Size/Extent of Tumor Bed: 1.2 x 0.6 cm   Size/Extent of Residual Invasive Tumor: No invasive tumor identified   Overall Residual Cellularity of the Tumor Bed: <1%   1.5 mm DCIS present  Margins Free From Tumor:  Yes  ER:  negative   PR:  negative    Her 2:  negative  Grade:  3  Lymph Nodes:  0/4  LVSI:  no  Extranodal extension:  no      BREAST IMAGING:  Mammogram:    -- Bilateral screening mammogram 12/20/16 ([place]) revealed scattered fibroglandular densities. There was a focal asymmetry present within the central posterior right breast. Additional diagnostic images and ultrasound were recommended. No abnormalities were present on the left breast.  -- Right diagnostic mammogram 01/04/17 ([place]) revealed an approximately 1.2 cm x 0.6 cm x 0.5 cm low-density mass at 4:00 with associated calcifications. This was considered suspicious and further evaluation with ultrasound will be performed.    Ultrasound:    -- Targeted right breast ultrasound 01/04/17 ([place]) revealed at 4:00 there was an irregular hypoechoic mass measuring approximately 6 mm x 9 mm x 8 mm with associated microcalcifications. This was thought to likely correlate with the mammographic abnormality and was considered somewhat suspicious. Ultrasound-guided biopsy was recommended. If the clip marker form sono guided biopsy of this mass did not correlate to the mammographic finding, additional stereotactic biopsy might be required. These findings recommendations were discussed in detail with the patient at the time of today's procedure. REPRODUCTIVE HEALTH:  Age at first Menarche: Unknown   Age at Menopause:  Hysterectomy/BSO at 28, HRT cream for several months    PROCEDURE: Right RSL lumpectomy/SLNB, 07/06/17  PERTINENT PMH:  HTN, chronic leukopenia  FAMILY HISTORY:  Maternal Grandmother- Breast cancer dx age late 67s  PHYSICAL EXAM on PRESENTATION: Right - No palpable breast masses. No skin, nipple, or areolar change. Left - No palpable breast masses. No skin, nipple, or areolar change. No supraclavicular or axillary adenopathy.   MEDICAL ONCOLOGY:  Dr. Olin Pia NEOADJUVANT THERAPY: Taxotere/carbo completed 06/01/17  REFERRED BY:  Efraim Kaufmann [place] PA       ? Lichen sclerosus et atrophicus of the vulva 09/22/2015   ? Agatston coronary artery calcium score between 200 and 399 08/21/2015   ? Hypercholesterolemia 08/20/2015   ? Vulvar lesion 03/12/2015   ? Recurrent UTI 09/11/2014   ? Dyspareunia, female 05/13/2013   ? Vaginal atrophy 05/13/2013   ? Urge incontinence of urine 05/13/2013   ? Mixed incontinence urge and stress (female)(female) 10/19/2011         Review of Systems   Constitution: Negative.   HENT: Negative.    Eyes: Negative.    Cardiovascular: Negative.    Respiratory: Negative.    Endocrine: Negative.    Hematologic/Lymphatic: Negative.    Skin: Negative.    Musculoskeletal: Negative.    Gastrointestinal: Negative.    Genitourinary: Negative.    Neurological: Negative.    Psychiatric/Behavioral: Negative.    Allergic/Immunologic: Negative.        Physical Exam  Vital signs were reviewed.   General Appearance:appears well nourished, elevated BMI, appears relaxed, in no acute distress,wearing a face mask  Skin: warm, moist, intact,  no rash or lesions, no xanthomas  HEENT: unremarkable, pupils equal and round, no scleral icterus, conjunctivae and lids normal  Lips & Mouth: no pallor or cyanosis  Neck Veins: neck veins are flat, neck veins are not distended   Carotid Arteries: normal carotid upstroke bilaterally, no bruits bilaterally Chest Inspection: chest is normal in appearance  Auscultation/Percussion/Effort: lungs clear to auscultation, no rales, rhonchi, or wheezing, respirations even and unlabored, no respiratory distress  Cardiac Rhythm: regular rhythm and normal rate   Cardiac Auscultation: normal S1 & S2, no S3 or S4, no rub   Murmurs: no cardiac murmurs   Extremities: no lower extremity edema bilaterally, 2+ symmetric distal pulses   Abdominal Exam: soft, non-tender,non-distended, no obvious masses, bowel sounds normal, no guarding  Liver & Spleen: no organomegaly   Neurologic Exam: grossly intact, alert, moves all extremities equally   Orientation: oriented to time, person and place, clear historian  Gait: normal, steady, walks without assistance  Language & Memory: speech clear, patient responsive, seems to comprehend information            Problems Addressed Today  Encounter Diagnoses   Name Primary?   ? Hypercholesterolemia Yes   ? Therapeutic drug monitoring    ? Agatston coronary artery calcium score between 200 and 399    ? Coronary artery disease involving native coronary artery of native heart without angina pectoris    ? Essential hypertension                      Current Medications (including today's revisions)  ? cholecalciferol (vitamin D3) (OPTIMAL D3) 50,000 units capsule TAKE ONE CAPSULE BY MOUTH EVERY WEEK   ? ciprofloxacin (CIPRO) 250 mg tablet Take 250 mg by mouth daily.   ? clobetasoL (TEMOVATE) 0.05 % topical cream Apply  topically to affected area three times weekly. (Patient taking differently: Apply  topically to affected area twice weekly.)   ? estradioL (ESTRACE) 0.01 % (0.1 mg/g) vaginal cream Apply 0.5 gram to the urethral opening at night for 2 weeks then twice weekly   ? evolocumab (REPATHA SURECLICK) 140 mg/mL injectable PEN Inject 1 mL under the skin every 14 days.   ? Lactobacillus rhamnosus GG (CULTURELLE PO) Take 1 tablet by mouth daily.   ? losartan (COZAAR) 50 mg tablet Take 50 mg by mouth daily. ? montelukast (SINGULAIR) 10 mg tablet Take one tablet by mouth at bedtime daily. Indications: inflammation of the nose due to an allergy, fibrosis   ? MULTIVITAMIN PO Take 1 tablet by mouth daily.   ? omeprazole DR (PRILOSEC) 20 mg capsule TAKE 1 CAPSULE BY MOUTH EVERY DAY BEFORE BREAKFAST   ? rosuvastatin (CRESTOR) 5 mg tablet Take 5 mg by mouth. 3x weekly

## 2019-07-28 ENCOUNTER — Encounter: Admit: 2019-07-28 | Discharge: 2019-07-28

## 2019-07-31 ENCOUNTER — Ambulatory Visit: Admit: 2019-07-31 | Discharge: 2019-07-31 | Payer: Private Health Insurance - Indemnity

## 2019-07-31 ENCOUNTER — Ambulatory Visit: Admit: 2019-07-31 | Discharge: 2019-08-08 | Payer: Private Health Insurance - Indemnity

## 2019-07-31 ENCOUNTER — Encounter: Admit: 2019-07-31 | Discharge: 2019-07-31

## 2019-08-05 ENCOUNTER — Encounter: Admit: 2019-08-05 | Discharge: 2019-08-05

## 2019-08-06 ENCOUNTER — Encounter: Admit: 2019-08-06 | Discharge: 2019-08-06

## 2019-08-06 ENCOUNTER — Ambulatory Visit: Admit: 2019-08-06 | Discharge: 2019-08-07 | Payer: Private Health Insurance - Indemnity

## 2019-08-06 DIAGNOSIS — R32 Unspecified urinary incontinence: Secondary | ICD-10-CM

## 2019-08-06 DIAGNOSIS — I2510 Atherosclerotic heart disease of native coronary artery without angina pectoris: Secondary | ICD-10-CM

## 2019-08-06 DIAGNOSIS — R102 Pelvic and perineal pain: Secondary | ICD-10-CM

## 2019-08-06 DIAGNOSIS — Z9289 Personal history of other medical treatment: Secondary | ICD-10-CM

## 2019-08-06 DIAGNOSIS — R3915 Urgency of urination: Secondary | ICD-10-CM

## 2019-08-06 DIAGNOSIS — E785 Hyperlipidemia, unspecified: Principal | ICD-10-CM

## 2019-08-06 DIAGNOSIS — K219 Gastro-esophageal reflux disease without esophagitis: Secondary | ICD-10-CM

## 2019-08-06 DIAGNOSIS — C50919 Malignant neoplasm of unspecified site of unspecified female breast: Secondary | ICD-10-CM

## 2019-08-06 DIAGNOSIS — D72819 Decreased white blood cell count, unspecified: Secondary | ICD-10-CM

## 2019-08-06 DIAGNOSIS — N390 Urinary tract infection, site not specified: Secondary | ICD-10-CM

## 2019-08-06 DIAGNOSIS — Z923 Personal history of irradiation: Secondary | ICD-10-CM

## 2019-08-06 DIAGNOSIS — L900 Lichen sclerosus et atrophicus: Secondary | ICD-10-CM

## 2019-08-06 DIAGNOSIS — D693 Immune thrombocytopenic purpura: Secondary | ICD-10-CM

## 2019-08-06 DIAGNOSIS — K449 Diaphragmatic hernia without obstruction or gangrene: Secondary | ICD-10-CM

## 2019-08-06 DIAGNOSIS — U071 COVID-19: Secondary | ICD-10-CM

## 2019-08-06 DIAGNOSIS — R1310 Dysphagia, unspecified: Secondary | ICD-10-CM

## 2019-08-06 DIAGNOSIS — Z0389 Encounter for observation for other suspected diseases and conditions ruled out: Secondary | ICD-10-CM

## 2019-08-06 DIAGNOSIS — I10 Essential (primary) hypertension: Secondary | ICD-10-CM

## 2019-08-06 DIAGNOSIS — N904 Leukoplakia of vulva: Secondary | ICD-10-CM

## 2019-08-06 DIAGNOSIS — Z9221 Personal history of antineoplastic chemotherapy: Secondary | ICD-10-CM

## 2019-08-06 LAB — URINALYSIS, MICROSCOPIC

## 2019-08-06 MED ORDER — HYOSCYAMINE SULFATE 0.125 MG SL SUBL
125 ug | ORAL_TABLET | SUBLINGUAL | 3 refills | Status: AC | PRN
Start: 2019-08-06 — End: ?

## 2019-08-06 NOTE — Progress Notes
The Prior Authorization for Repatha was submitted for Teresa Trevino via Cover My Meds.  Will continue to follow.    Thompson Caul  Pharmacy Patient Advocate  [phone]

## 2019-08-07 DIAGNOSIS — N952 Postmenopausal atrophic vaginitis: Secondary | ICD-10-CM

## 2019-08-07 DIAGNOSIS — N3941 Urge incontinence: Secondary | ICD-10-CM

## 2019-08-07 DIAGNOSIS — N390 Urinary tract infection, site not specified: Principal | ICD-10-CM

## 2019-08-08 ENCOUNTER — Encounter: Admit: 2019-08-08 | Discharge: 2019-08-08

## 2019-08-08 DIAGNOSIS — N904 Leukoplakia of vulva: Secondary | ICD-10-CM

## 2019-08-08 DIAGNOSIS — Z0389 Encounter for observation for other suspected diseases and conditions ruled out: Secondary | ICD-10-CM

## 2019-08-08 DIAGNOSIS — K449 Diaphragmatic hernia without obstruction or gangrene: Secondary | ICD-10-CM

## 2019-08-08 DIAGNOSIS — Z9221 Personal history of antineoplastic chemotherapy: Secondary | ICD-10-CM

## 2019-08-08 DIAGNOSIS — E785 Hyperlipidemia, unspecified: Secondary | ICD-10-CM

## 2019-08-08 DIAGNOSIS — Z9289 Personal history of other medical treatment: Secondary | ICD-10-CM

## 2019-08-08 DIAGNOSIS — R1310 Dysphagia, unspecified: Secondary | ICD-10-CM

## 2019-08-08 DIAGNOSIS — I10 Essential (primary) hypertension: Secondary | ICD-10-CM

## 2019-08-08 DIAGNOSIS — K219 Gastro-esophageal reflux disease without esophagitis: Secondary | ICD-10-CM

## 2019-08-08 DIAGNOSIS — D693 Immune thrombocytopenic purpura: Secondary | ICD-10-CM

## 2019-08-08 DIAGNOSIS — C50311 Malignant neoplasm of lower-inner quadrant of right female breast: Secondary | ICD-10-CM

## 2019-08-08 DIAGNOSIS — I2510 Atherosclerotic heart disease of native coronary artery without angina pectoris: Secondary | ICD-10-CM

## 2019-08-08 DIAGNOSIS — U071 COVID-19: Secondary | ICD-10-CM

## 2019-08-08 DIAGNOSIS — L900 Lichen sclerosus et atrophicus: Secondary | ICD-10-CM

## 2019-08-08 DIAGNOSIS — N3090 Cystitis, unspecified without hematuria: Secondary | ICD-10-CM

## 2019-08-08 DIAGNOSIS — C50919 Malignant neoplasm of unspecified site of unspecified female breast: Secondary | ICD-10-CM

## 2019-08-08 DIAGNOSIS — N390 Urinary tract infection, site not specified: Secondary | ICD-10-CM

## 2019-08-08 DIAGNOSIS — R32 Unspecified urinary incontinence: Secondary | ICD-10-CM

## 2019-08-08 DIAGNOSIS — Z923 Personal history of irradiation: Secondary | ICD-10-CM

## 2019-08-08 DIAGNOSIS — D72819 Decreased white blood cell count, unspecified: Secondary | ICD-10-CM

## 2019-08-08 MED ORDER — CEFPODOXIME 200 MG PO TAB
200 mg | ORAL_TABLET | Freq: Two times a day (BID) | ORAL | 0 refills | 7.00000 days | Status: AC
Start: 2019-08-08 — End: ?

## 2019-08-09 ENCOUNTER — Encounter: Admit: 2019-08-09 | Discharge: 2019-08-09

## 2019-08-09 NOTE — Progress Notes
The Prior Authorization for Repatha was approved for Teresa Trevino thru to 02/04/20.     Left voicemail for patient to call back as her insurance does not allow vacation override.  Asked patient to call back with date of her return so I could make sure we get her medication to her upon her return.    Thompson Caul  Pharmacy Patient Advocate  [phone]

## 2019-08-09 NOTE — Telephone Encounter
Called pt to review results of recent urine culture - positive for infection. Reviewed abx and pharmacy. She will d/c daily cipro.     She will complete her CT Urogram at [facility] in [place], [place]. Order faxed to [phone].

## 2019-08-09 NOTE — Telephone Encounter
-----   Message from Marshia Ly, PA-C sent at 08/08/2019  2:30 PM CDT -----  Teresa Trevino,    Please check that patient has reviewed MyChart message and if not please contact patient to review plan     #I did not see that she has her CT scan scheduled, was she going to go to an outside facility??    Thank you ~ Danella Sensing    Future Appointments  10/17/2019   11:30 AM   Sherron Monday, MD     [facility]              Urology   10/24/2019   11:00 AM   Alwyn Pea, DO         [id number]        [place] Exam  11/01/2019  11:00 AM   McIntosh-James, Ginger E,# [facility]          [facility] Exam  11/19/2019  11:00 AM   Staecker, Hennie Duos, MD   [facility]            OB/GYN  01/22/2020  11:30 AM   Reola Mosher, MD      [id number]        [facility]  02/11/2020 11:20 AM   Marshia Ly, PA-C       [facility]              Urology

## 2019-08-12 ENCOUNTER — Encounter: Admit: 2019-08-12 | Discharge: 2019-08-12

## 2019-08-12 NOTE — Telephone Encounter
Erica with [facility] scheduling [phone]) calling to advise CT needs PA.

## 2019-08-13 ENCOUNTER — Encounter: Admit: 2019-08-13 | Discharge: 2019-08-13

## 2019-08-13 NOTE — Progress Notes
Patient called back discussed how her insurance did not approve her vacation over ride.  Informed patient that we will ship the medication out for her on 08/26/19 since she will be home on 7/13.    Thompson Caul  Patient Advocate

## 2019-08-14 ENCOUNTER — Encounter: Admit: 2019-08-14 | Discharge: 2019-08-14

## 2019-08-14 DIAGNOSIS — N390 Urinary tract infection, site not specified: Secondary | ICD-10-CM

## 2019-08-14 NOTE — Telephone Encounter
Scheduling department form [facility] called to get patient's CT urogram scheduled. Stated they needed a recent BUN and Crt done prior to scheduling. They provided fax: [phone] for order. Orders faxed

## 2019-08-26 ENCOUNTER — Encounter: Admit: 2019-08-26 | Discharge: 2019-08-26

## 2019-08-26 MED FILL — REPATHA SURECLICK 140 MG/ML SC PNIJ: 140 mg/mL | SUBCUTANEOUS | 28 days supply | Qty: 2 | Fill #2 | Status: AC

## 2019-08-26 NOTE — Progress Notes
Copay assistance  was RENEWED for the specialty medication Repatha using copay card from manufacturer and now the copay is $5..  Teresa Trevino has stated this copay is affordable.      The medication will be delivered to patient's prescription address per the patient's request.    Thompson Caul  Pharmacy Patient Advocate  [phone]

## 2019-08-27 ENCOUNTER — Encounter: Admit: 2019-08-27 | Discharge: 2019-08-27

## 2019-08-27 MED ORDER — CLOBETASOL 0.05 % TP OINT
OPHTHALMIC | 2 refills | 30.00000 days | Status: AC
Start: 2019-08-27 — End: ?

## 2019-09-11 ENCOUNTER — Encounter: Admit: 2019-09-11 | Discharge: 2019-09-11

## 2019-09-16 ENCOUNTER — Encounter: Admit: 2019-09-16 | Discharge: 2019-09-16

## 2019-09-16 MED FILL — REPATHA SURECLICK 140 MG/ML SC PNIJ: 140 mg/mL | SUBCUTANEOUS | 28 days supply | Qty: 2 | Fill #3 | Status: AC

## 2019-09-16 NOTE — Telephone Encounter
Returned call to pt regarding concerns for UTI. Pt reports urgency, frequency, burning with urination, foul smelling urine, and cloudiness. Denies fever, chills, nausea, vomiting. Advised I would enter order for culture. She is requesting to complete at [facility] in [place], [place]. Orders faxed to [phone].    She is also requesting to review results of CT Urogram completed at [facility]. Advised I would request results and reach out to her to review as soon as possible. No further questions. Called [phone] and requested from health informations. She will fax results and cloud images.

## 2019-09-23 ENCOUNTER — Encounter: Admit: 2019-09-23 | Discharge: 2019-09-23

## 2019-09-24 ENCOUNTER — Encounter: Admit: 2019-09-24 | Discharge: 2019-09-24

## 2019-09-24 MED ORDER — AMPICILLIN 500 MG PO CAP
500 mg | ORAL_CAPSULE | ORAL | 0 refills | Status: AC
Start: 2019-09-24 — End: ?

## 2019-09-24 NOTE — Progress Notes
Reviewed outside records    [facility]  August 29, 2019  Creatinine 0.84    CT urogram- 08/29/19   [facility]   Impression no hydronephrosis or renal calculi no renal mass lesions are seen  Air in the urinary bladder likely reflect recent instrumentation bladder is not well distended no obvious filling defects are seen    See report, noted No renal calculi are evident no hydronephrosis no renal mass lesions are seen  Colonic diverticulosis without diverticulitis        *Unclear regarding recent instrumentation of bladder, will contact patient to review   Imaging and lab  Note to [facility] to see if recent bladder instrumentation (foley catheter? Cystoscopy outside provider?)    Caitlin  Please contact patient to review outside CT Urogram results and recent Cr  See note, has she had recent bladder instrumentation?  Any changes with urination? Passing air or stool material with urination?  UTIs since last visit?  Please also assist with requesting outside images to be scanned or that she brings disc to appt with Dr Littie Deeds  Thank you ~ Danella Sensing      Continue plan for cystoscopy as planned with Dr. Littie Deeds            Reviewed office note, 08/06/19, Gershon Cull, PA-C    Assessment and Plan:  ?  Recurrent UTI  UA micro and Urine culture ~ no antibiotics, cath sample today, took suppressive Cipro 250 mg yesterday  UA micro and Urine culture in the future with S&S of UTI, reviewed instructions and orders entered  recommend vitamin cocktail daily  avoid dietary irritants  Continue vaginal estrace cream, reviewed application and handout provided, 3 x weekly QHS  Continue clobetasol, as directed with gynecology, patient requesting referral to dermatology  recommend  probiotic daily  Recommend theraworx perineal cleanser, history of fecal incontinence  recommend imaging CT urogram next available  Recommend cystoscopy ~ Dr Griebling,(recurrent UTIs, history of bladder injury, history of urethral sling) may need to discuss cystoscopy and hydrodistention  Recommend repeating PFPT, best if at [place] with her history of pelvic pain  ?  recommend FU 2-3 months  ??  Vaginal atrophy  See above  Note she has been using per other providers (history of breast cancer)  ?  Urge incontinence of urine  Hold on OAB Rx at this time  Trial of levsin, reviewed if helpful and taking often an OAB Rx daily dosing, maybe helpful  OAB list given for her to review, hold on ordering at this time  Recommend repeating PFPT, she defers at this time  Hold on URD evaluation at this time  ??  Lichen sclerosus et atrophicus of the vulva  Continue working with gynecology  Clobetasol vaginal cream per gynecology  Patient requesting referral to Dermatology as well  ?  Breast cancer (HCC)  Using vaginal estrace cream, per other providers  If approved with her other providers, recommend continued use  Reviewed Urology recommendations and vaginal estrace cream  ?  Nocturia  See above  Limit fluids in the evening  Will monitor  Hold on OAB Rx at this time  ?  Pelvic pain  See above  May consider atarax in the future  ??  Post-void dribbling  Reviewed vaginal voiding and techniques        Future Appointments   Date Time Provider Department Center   10/24/2019 11:00 AM Alwyn Pea, DO [facility] [place] Exam   10/30/2019  3:00 PM Sherron Monday, MD [facility] Urology   11/01/2019 11:00 AM McIntosh-James, Ginger E, APRN-NP [facility] Exam   11/19/2019 11:00 AM Staecker, Hennie Duos, MD [facility] OB/GYN   12/25/2019 10:00 AM Shanon Ace, MD [facility] IM   01/22/2020 11:30 AM Reola Mosher, MD [facility] [place] [facility]   02/11/2020 11:20 AM Marshia Ly, PA-C [facility] Urology

## 2019-09-24 NOTE — Telephone Encounter
Called pt to review results of recent urine culture - positive. Per Dr. Lorenz Coaster, will send ampicillin 500 mg tabs PO Q6H. LVM reviewing. Will call tomorrow with CT results and to review abx again.

## 2019-10-01 ENCOUNTER — Encounter: Admit: 2019-10-01 | Discharge: 2019-10-01

## 2019-10-01 DIAGNOSIS — N3941 Urge incontinence: Secondary | ICD-10-CM

## 2019-10-01 DIAGNOSIS — N390 Urinary tract infection, site not specified: Secondary | ICD-10-CM

## 2019-10-01 DIAGNOSIS — N952 Postmenopausal atrophic vaginitis: Secondary | ICD-10-CM

## 2019-10-01 DIAGNOSIS — R102 Pelvic and perineal pain: Secondary | ICD-10-CM

## 2019-10-01 DIAGNOSIS — L900 Lichen sclerosus et atrophicus: Secondary | ICD-10-CM

## 2019-10-01 DIAGNOSIS — R3915 Urgency of urination: Secondary | ICD-10-CM

## 2019-10-02 ENCOUNTER — Encounter: Admit: 2019-10-02 | Discharge: 2019-10-02

## 2019-10-02 NOTE — Telephone Encounter
-----   Message from Marshia Ly, PA-C sent at 09/24/2019  1:35 PM CDT -----  CaitlinPlease contact patient to review outside CT Urogram results and recent CrSee note, has she had recent bladder instrumentation?Any changes with urination? Passing air or stool material with urination?UTIs since last visit?Please also assist with requesting outside images to be scanned or that she brings disc to appt with Dr Maryruth Hancock you ~ Danella Sensing

## 2019-10-02 NOTE — Telephone Encounter
Called to review recent CT imaging and Cr. She will f/u as scheduled. No further questions.

## 2019-10-08 ENCOUNTER — Encounter: Admit: 2019-10-08 | Discharge: 2019-10-08

## 2019-10-08 DIAGNOSIS — N390 Urinary tract infection, site not specified: Secondary | ICD-10-CM

## 2019-10-08 DIAGNOSIS — R399 Unspecified symptoms and signs involving the genitourinary system: Secondary | ICD-10-CM

## 2019-10-10 ENCOUNTER — Encounter: Admit: 2019-10-10 | Discharge: 2019-10-10

## 2019-10-11 ENCOUNTER — Encounter: Admit: 2019-10-11 | Discharge: 2019-10-11

## 2019-10-14 ENCOUNTER — Encounter: Admit: 2019-10-14 | Discharge: 2019-10-14

## 2019-10-14 MED FILL — REPATHA SURECLICK 140 MG/ML SC PNIJ: 140 mg/mL | SUBCUTANEOUS | 28 days supply | Qty: 2 | Fill #4 | Status: AC

## 2019-10-16 ENCOUNTER — Encounter: Admit: 2019-10-16 | Discharge: 2019-10-16

## 2019-10-17 ENCOUNTER — Encounter: Admit: 2019-10-17 | Discharge: 2019-10-17

## 2019-10-17 DIAGNOSIS — N390 Urinary tract infection, site not specified: Secondary | ICD-10-CM

## 2019-10-17 DIAGNOSIS — R399 Unspecified symptoms and signs involving the genitourinary system: Secondary | ICD-10-CM

## 2019-10-17 MED ORDER — CEFPODOXIME 200 MG PO TAB
200 mg | ORAL_TABLET | Freq: Two times a day (BID) | ORAL | 0 refills | 7.00000 days | Status: AC
Start: 2019-10-17 — End: ?

## 2019-10-18 ENCOUNTER — Encounter: Admit: 2019-10-18 | Discharge: 2019-10-18

## 2019-10-22 ENCOUNTER — Encounter: Admit: 2019-10-22 | Discharge: 2019-10-22

## 2019-10-24 ENCOUNTER — Encounter: Admit: 2019-10-24 | Discharge: 2019-10-24

## 2019-10-24 ENCOUNTER — Encounter: Admit: 2019-10-24 | Discharge: 2019-10-24 | Payer: Private Health Insurance - Indemnity

## 2019-10-24 DIAGNOSIS — D696 Thrombocytopenia, unspecified: Secondary | ICD-10-CM

## 2019-10-24 DIAGNOSIS — L900 Lichen sclerosus et atrophicus: Secondary | ICD-10-CM

## 2019-10-24 DIAGNOSIS — I2510 Atherosclerotic heart disease of native coronary artery without angina pectoris: Secondary | ICD-10-CM

## 2019-10-24 DIAGNOSIS — D693 Immune thrombocytopenic purpura: Secondary | ICD-10-CM

## 2019-10-24 DIAGNOSIS — N390 Urinary tract infection, site not specified: Secondary | ICD-10-CM

## 2019-10-24 DIAGNOSIS — C50311 Malignant neoplasm of lower-inner quadrant of right female breast: Secondary | ICD-10-CM

## 2019-10-24 DIAGNOSIS — N904 Leukoplakia of vulva: Secondary | ICD-10-CM

## 2019-10-24 DIAGNOSIS — I10 Essential (primary) hypertension: Secondary | ICD-10-CM

## 2019-10-24 DIAGNOSIS — K449 Diaphragmatic hernia without obstruction or gangrene: Secondary | ICD-10-CM

## 2019-10-24 DIAGNOSIS — Z923 Personal history of irradiation: Secondary | ICD-10-CM

## 2019-10-24 DIAGNOSIS — C50919 Malignant neoplasm of unspecified site of unspecified female breast: Secondary | ICD-10-CM

## 2019-10-24 DIAGNOSIS — D72819 Decreased white blood cell count, unspecified: Secondary | ICD-10-CM

## 2019-10-24 DIAGNOSIS — Z9221 Personal history of antineoplastic chemotherapy: Secondary | ICD-10-CM

## 2019-10-24 DIAGNOSIS — E785 Hyperlipidemia, unspecified: Principal | ICD-10-CM

## 2019-10-24 DIAGNOSIS — U071 COVID-19: Secondary | ICD-10-CM

## 2019-10-24 DIAGNOSIS — Z9289 Personal history of other medical treatment: Secondary | ICD-10-CM

## 2019-10-24 DIAGNOSIS — R1310 Dysphagia, unspecified: Secondary | ICD-10-CM

## 2019-10-24 DIAGNOSIS — G629 Polyneuropathy, unspecified: Secondary | ICD-10-CM

## 2019-10-24 DIAGNOSIS — R32 Unspecified urinary incontinence: Secondary | ICD-10-CM

## 2019-10-24 DIAGNOSIS — Z0389 Encounter for observation for other suspected diseases and conditions ruled out: Secondary | ICD-10-CM

## 2019-10-24 DIAGNOSIS — K219 Gastro-esophageal reflux disease without esophagitis: Secondary | ICD-10-CM

## 2019-10-24 NOTE — Progress Notes
10/24/2019    Name: Teresa Trevino  DOB: Dec 21, 1955  MRN: [id number]    Primary Care Physician: Rockwell Germany   Surgeon: Dr. Ruthy Dick  Radiation oncologist: Dr. Nash Dimmer     Chief Complaint:   Chief Complaint   Patient presents with   ? Heme/Onc Care     Hematology/Oncology History:  Cancer Staging  Malignant neoplasm of lower-inner quadrant of right breast of female, estrogen receptor negative (HCC)  Staging form: Breast, AJCC 8th Edition  - Clinical stage from 01/19/2017: Stage IB (cT1b, cN0, cM0, G3, ER-, PR-, HER2-) - Signed by Dimas Alexandria, PA-C on 01/24/2017    1.  History of chronic intermittent leukopenia and thrombocytopenia, clinically insignificant.  May have autoimmune component and appeared to be her baseline.  However, labs in 01/2017 were unremarkable.  2.  November 2018: Noted on screening mammogram to have 1.2 cm abnormality in the right breast.  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  3.  06/01/2017: Completed neoadjuvant carboplatin/docetaxel x6 with complete response obtained on breast MRI and ultrasound.  After cycle 1, decreased by 20% due to significant toxicity.  4.  07/06/2017: Lumpectomy (Dr. Loreta Ave) showed complete response with no active malignancy.  0/4 lymph nodes involved.  5.  10/05/2017: Completed radiation (Dr. Ardeth Perfect)  6.  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  W.  Current plan: Observation    EGD/colonoscopy 01/2018 ([place]) negative, 10-year follow-up recommended  Mammogram 03/2019 ([place]): Negative    Interval Events  Teresa Trevino presents to the clinic today unaccompanied for follow-up of her breast cancer.  She and her husband remain very active.  They are currently building a house.  They are selling their condo in [place] and also buying a [place] house.  She continues to struggle with recurrent UTI and is back on antibiotics for this.  She does experience some lower extremity edema and has had neuropathy in her bilateral second toes particularly when she lays down at night.  This is a new problem, was not present following chemotherapy.  She continues struggling gaining weight.    Medical history  Teresa Trevino has a past medical history of Breast cancer (HCC) (01/2017) (right IDC), Chronic leukopenia, Coronary artery disease, COVID-19 (10/2018), Difficulty swallowing (past issue during chemotherapy-no longer an issue per pt), Dyslipidemia, GERD (gastroesophageal reflux disease), Hiatal hernia, History of blood transfusion (during chemo therapy), History of chemotherapy, History of EKG (01/2017), History of radiation therapy, Hyperlipemia (08/20/2015), Hypertension, Incontinence, ITP (idiopathic thrombocytopenic purpura) (as a kid, resolved), Lichen sclerosus, Lichen sclerosus et atrophicus of the vulva (09/22/2015), Observation for suspected cardiovascular disease (08/20/2015) (04/21/15  Stress Echo :  1.  No exercise induced chest pain.  2.  No ECG evidence of ischemia.  3.  No echocardiographic evidence of ischemia.  4.  Normal  Hemodynamic response to exercise.  5.  No significant exercise induced arrhythmias.  6.  Low risk scan.  7.  Consider medical management if indicated.), and Urinary tract infection.    She has a past surgical history that includes hysterectomy (2008); colonoscopy; Foot surgery; ct angiogram (non-invasive) (09/2015); Upper gastrointestinal endoscopy (05/26/2017); tonsillectomy (1977); Mastectomy, partial (Right, 07/06/2017) (RIGHT RADIOACTIVE SEED LOCALIZED LUMPECTOMY performed by Ruthy Dick, DO at [facility] OR/PERIOP); lymph node biopsy (Right, 07/06/2017) (SENTINEL LYMPH NODE BIOPSY performed by Ruthy Dick, DO at [facility] OR/PERIOP); Colonoscopy (N/A, 02/01/2018) (COLONOSCOPY DIAGNOSTIC WITH SPECIMEN COLLECTION BY BRUSHING/ WASHING -  FLEXIBLE performed by Virgina Organ, MD at [facility] OR/PERIOP); Upper gastrointestinal endoscopy (N/A, 02/01/2018) (ESOPHAGOGASTRODUODENOSCOPY WITH BIOPSY - FLEXIBLE performed by Virgina Organ, MD at [facility] OR/PERIOP); Bladder surgery (2013) (bladder sling); and bladder repair (2008) [facility]).    Teresa Trevino's family history includes Cancer in her maternal grandmother; Diabetes in her father, maternal grandfather, mother, and paternal uncle; Heart Failure in her father; High Cholesterol in her mother; Hypertension in her mother; Kidney Failure in her mother; None Reported in her sister; Stroke in her mother; Thyroid Disease in her mother.    Social history: Married.  Her husband is a semiretired Education officer, community who is president of the [place].  They have a condo in [place] which they enjoy and also rent out.  She has never been a smoker.  She rarely drinks alcohol, no drug use.        Medications    Current Outpatient Medications:   ?  cefpodoxime (VANTIN) 200 mg tablet, Take one tablet by mouth every 12 hours. Take with food., Disp: 14 tablet, Rfl: 0  ?  cholecalciferol (vitamin D3) (OPTIMAL D3) 50,000 units capsule, TAKE ONE CAPSULE BY MOUTH EVERY WEEK, Disp: , Rfl:   ?  clobetasoL (TEMOVATE) 0.05 % topical ointment, APPLY TOPICALLY TO AFFECTED AREABID FOR 4 WEEKS THEN TAPER AS DIRECTED, Disp: 30 g, Rfl: 2  ?  estradioL (ESTRACE) 0.01 % (0.1 mg/g) vaginal cream, Apply 0.5 gram to the urethral opening at night for 2 weeks then twice weekly, Disp: 42.5 g, Rfl: 11  ?  evolocumab (REPATHA SURECLICK) 140 mg/mL injectable PEN, Inject 1 mL under the skin every 14 days., Disp: 2 mL, Rfl: 11  ?  hyoscyamine sulfate (LEVSIN/SL) 0.125 mg sublingual tablet, Place one tablet under tongue every 4 hours as needed for Other... (Bladder spasms, urgency, pelvic pain). Max 1.5 mg/ day, Disp: 30 tablet, Rfl: 3  ?  Lactobacillus rhamnosus GG (CULTURELLE PO), Take 1 tablet by mouth daily., Disp: , Rfl:   ?  losartan (COZAAR) 50 mg tablet, Take 50 mg by mouth daily., Disp: , Rfl:   ?  omeprazole DR (PRILOSEC) 20 mg capsule, TAKE 1 CAPSULE BY MOUTH EVERY DAY BEFORE BREAKFAST, Disp: 90 capsule, Rfl: 1  ?  rosuvastatin (CRESTOR) 5 mg tablet, Take 5 mg by mouth. 3x weekly, Disp: , Rfl:      Allergies:   Allergies   Allergen Reactions   ? Bactrim [Sulfamethoxazole-Trimethoprim] NAUSEA AND VOMITING     Makes me feel ill   ? Erythromycin NAUSEA AND VOMITING   ? Macrobid [Nitrofurantoin Monohyd/M-Cryst] NAUSEA AND VOMITING   ? Sulfa (Sulfonamide Antibiotics) NAUSEA AND VOMITING       Review of Systems  Review of Systems   Constitutional: Negative.  Negative for activity change, fever and unexpected weight change.   HENT: Negative.  Negative for mouth sores.    Eyes: Negative.    Respiratory: Negative.  Negative for shortness of breath.    Cardiovascular: Negative.  Negative for palpitations.   Gastrointestinal: Negative.  Negative for abdominal pain and vomiting. Diarrhea: Improved with Imodium.   Endocrine: Negative.    Genitourinary:        Recurrent UTI   Musculoskeletal: Negative.    Skin: Negative.  Negative for pallor.   Allergic/Immunologic: Negative.    Neurological: Positive for numbness (Bilateral second toes). Negative for dizziness and light-headedness.   Hematological: Negative.    Psychiatric/Behavioral: Negative.    All other systems reviewed and are negative.  Pain Score: 0     Pain Addressed: N/A    Patient Evaluated for a Clinical Trial: No treatment clinical trial available for this patient.    [place] Cooperative Oncology Group performance status is 0, Fully active, able to carry on all pre-disease performance without restriction.    Physical Exam  Vitals:    10/24/19 1052   BP: (!) 146/78   BP Source: Arm, Left Upper   Patient Position: Sitting   Pulse: 75   Resp: 18   Temp: 36.2 ?C (97.1 ?F)   TempSrc: Temporal   SpO2: 98%   Weight: 95.7 kg (211 lb)   Height: 172.7 cm (68)   PainSc: Zero      Physical Exam  Vitals reviewed.   Constitutional:       General: She is not in acute distress.     Appearance: She is not ill-appearing.   Eyes:      General: No scleral icterus.  Cardiovascular:      Rate and Rhythm: Normal rate and regular rhythm.      Heart sounds: Normal heart sounds.   Pulmonary:      Effort: Pulmonary effort is normal. No respiratory distress.      Breath sounds: Normal breath sounds. No wheezing or rales.   Chest:      Breasts: Breasts are symmetrical.         Right: Skin change (Radiation changes with thickened skin on inferior breast/nipple following radiation.  Unchanged.) present. No inverted nipple, mass, nipple discharge or tenderness.         Left: No inverted nipple, mass, nipple discharge, skin change or tenderness.   Abdominal:      General: There is no distension.      Palpations: Abdomen is soft.      Tenderness: There is no abdominal tenderness.   Musculoskeletal:         General: No swelling.   Lymphadenopathy:      Cervical: No cervical adenopathy.      Upper Body:      Right upper body: No supraclavicular adenopathy.      Left upper body: No supraclavicular adenopathy.   Skin:     Coloration: Skin is not jaundiced or pale.      Findings: No rash.   Neurological:      General: No focal deficit present.      Mental Status: She is alert and oriented to person, place, and time.      Cranial Nerves: No cranial nerve deficit.      Motor: No weakness or abnormal muscle tone.      Coordination: Coordination normal.      Gait: Gait is intact.   Psychiatric:         Mood and Affect: Mood and affect normal.         Behavior: Behavior normal.         Thought Content: Thought content normal.         Cognition and Memory: Memory normal.         Judgment: Judgment normal.            Labs/ Imaging /Pathology   Mammogram reviewed.    Assessment & Plan:  Teresa Trevino is a 64 year old female with the following medical problems:  ?  1.  T1b N0 M0 stage IB triple negative cancer of the RIGHT breast s/p neoadjuvant carboplatin/docetaxel and lumpectomy 06/2017 with complete response, radiation 09/2017.  Now on observation, 2 years out  with no clinical evidence of recurrence.  2.  Chronic mild thrombocytopenia.  Clinically insignificant and stable, present since 2013.    3.  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  Unrelated to chemotherapy, recently began.  I wonder if this may be related more to increased weights, lower extremity swelling causing nerve compression?    Current plan:  1.  Continue observation.  2.  Next mammogram anticipated 03/2020 at [place].  3.  She will see Dr. Isabella Stalling 01/2020, so I will see her again in 6 months.    Thank you for the opportunity to participate in her care.    Parts of this note were created with voice recognition software. Please excuse any grammatical or typographical errors.

## 2019-10-25 ENCOUNTER — Encounter: Admit: 2019-10-25 | Discharge: 2019-10-25

## 2019-10-29 ENCOUNTER — Encounter: Admit: 2019-10-29 | Discharge: 2019-10-29

## 2019-10-29 DIAGNOSIS — E7800 Pure hypercholesterolemia, unspecified: Secondary | ICD-10-CM

## 2019-10-29 DIAGNOSIS — I2510 Atherosclerotic heart disease of native coronary artery without angina pectoris: Secondary | ICD-10-CM

## 2019-10-29 LAB — LIPID PROFILE
Lab: 109
Lab: 16
Lab: 52
Lab: 3

## 2019-10-29 LAB — ALT (SGPT): Lab: 26

## 2019-10-29 LAB — AST (SGOT): Lab: 22

## 2019-10-30 ENCOUNTER — Encounter: Admit: 2019-10-30 | Discharge: 2019-10-30

## 2019-10-30 ENCOUNTER — Ambulatory Visit: Admit: 2019-10-30 | Discharge: 2019-10-31 | Payer: Private Health Insurance - Indemnity

## 2019-10-30 DIAGNOSIS — D693 Immune thrombocytopenic purpura: Secondary | ICD-10-CM

## 2019-10-30 DIAGNOSIS — R1310 Dysphagia, unspecified: Secondary | ICD-10-CM

## 2019-10-30 DIAGNOSIS — C50919 Malignant neoplasm of unspecified site of unspecified female breast: Secondary | ICD-10-CM

## 2019-10-30 DIAGNOSIS — E785 Hyperlipidemia, unspecified: Principal | ICD-10-CM

## 2019-10-30 DIAGNOSIS — Z0389 Encounter for observation for other suspected diseases and conditions ruled out: Secondary | ICD-10-CM

## 2019-10-30 DIAGNOSIS — I10 Essential (primary) hypertension: Secondary | ICD-10-CM

## 2019-10-30 DIAGNOSIS — D72819 Decreased white blood cell count, unspecified: Secondary | ICD-10-CM

## 2019-10-30 DIAGNOSIS — Z9221 Personal history of antineoplastic chemotherapy: Secondary | ICD-10-CM

## 2019-10-30 DIAGNOSIS — U071 COVID-19: Secondary | ICD-10-CM

## 2019-10-30 DIAGNOSIS — N904 Leukoplakia of vulva: Secondary | ICD-10-CM

## 2019-10-30 DIAGNOSIS — N390 Urinary tract infection, site not specified: Principal | ICD-10-CM

## 2019-10-30 DIAGNOSIS — Z923 Personal history of irradiation: Secondary | ICD-10-CM

## 2019-10-30 DIAGNOSIS — Z9289 Personal history of other medical treatment: Secondary | ICD-10-CM

## 2019-10-30 DIAGNOSIS — K449 Diaphragmatic hernia without obstruction or gangrene: Secondary | ICD-10-CM

## 2019-10-30 DIAGNOSIS — R32 Unspecified urinary incontinence: Secondary | ICD-10-CM

## 2019-10-30 DIAGNOSIS — L900 Lichen sclerosus et atrophicus: Secondary | ICD-10-CM

## 2019-10-30 DIAGNOSIS — I2510 Atherosclerotic heart disease of native coronary artery without angina pectoris: Secondary | ICD-10-CM

## 2019-10-30 DIAGNOSIS — K219 Gastro-esophageal reflux disease without esophagitis: Secondary | ICD-10-CM

## 2019-10-30 MED ORDER — METRONIDAZOLE 0.75 % TP GEL
TOPICAL | 3 refills | 30.00000 days | Status: AC
Start: 2019-10-30 — End: ?

## 2019-10-30 NOTE — Patient Instructions
Prescription placed for Metrogel (vaginal gel) -  Please use this every other day.  Alternate days with estrogen vaginal cream (Estrace).      Fingertip Application Method  For Estrogen Cream    1. Wash your hands with soap and water and dry thoroughly.    2. Squeeze tube to express out 1 gram of cream (about enough to cover the tip of your index finger, from the last joint to the finger tip.  (see Figure 1)          Figure 1  This instruction sheet is provided to help your practitioner explain his/her preferred method for applying estrogen cream.  Your doctor or nurse will likely use this sheet to assist in their patient education activities.  Please note that this information is not intended to replace your practitioner?s instructions: always follow your doctor?s specific directions regarding the use of any prescription medications    3. Locate the vaginal opening (see Figure 2).  Immediately above the vaginal opening is the urethra (a small opening where urine is eliminated from you body).  The urethra may not be as easily identified as the vagina because the opening is much smaller, however, [place] the diagram to determine its approximate location.    4. Carefully spread the cream onto the top wall of the vagina just underneath the urethral area (see Figure 2, yellow highlighted area).  As the cream is spread, some may be gently inserted into the vagina: however, it is not necessary to push the cream high into the vagina. Rub this into the vaginal wall underneath the urethra as one would rub lotion into the skin.    5. Do not use the applicator that may come with the prescription for the estrogen cream. Use only the small finger tip amount as noted above.                       Figure 2

## 2019-11-01 ENCOUNTER — Encounter: Admit: 2019-11-01 | Discharge: 2019-11-01

## 2019-11-01 DIAGNOSIS — D696 Thrombocytopenia, unspecified: Secondary | ICD-10-CM

## 2019-11-01 DIAGNOSIS — D693 Immune thrombocytopenic purpura: Secondary | ICD-10-CM

## 2019-11-01 DIAGNOSIS — Z9221 Personal history of antineoplastic chemotherapy: Secondary | ICD-10-CM

## 2019-11-01 DIAGNOSIS — E785 Hyperlipidemia, unspecified: Secondary | ICD-10-CM

## 2019-11-01 DIAGNOSIS — C50919 Malignant neoplasm of unspecified site of unspecified female breast: Secondary | ICD-10-CM

## 2019-11-01 DIAGNOSIS — I2510 Atherosclerotic heart disease of native coronary artery without angina pectoris: Secondary | ICD-10-CM

## 2019-11-01 DIAGNOSIS — R931 Abnormal findings on diagnostic imaging of heart and coronary circulation: Secondary | ICD-10-CM

## 2019-11-01 DIAGNOSIS — R32 Unspecified urinary incontinence: Secondary | ICD-10-CM

## 2019-11-01 DIAGNOSIS — Z0389 Encounter for observation for other suspected diseases and conditions ruled out: Secondary | ICD-10-CM

## 2019-11-01 DIAGNOSIS — L900 Lichen sclerosus et atrophicus: Secondary | ICD-10-CM

## 2019-11-01 DIAGNOSIS — D72819 Decreased white blood cell count, unspecified: Secondary | ICD-10-CM

## 2019-11-01 DIAGNOSIS — Z923 Personal history of irradiation: Secondary | ICD-10-CM

## 2019-11-01 DIAGNOSIS — N904 Leukoplakia of vulva: Secondary | ICD-10-CM

## 2019-11-01 DIAGNOSIS — Z9289 Personal history of other medical treatment: Secondary | ICD-10-CM

## 2019-11-01 DIAGNOSIS — I10 Essential (primary) hypertension: Secondary | ICD-10-CM

## 2019-11-01 DIAGNOSIS — K449 Diaphragmatic hernia without obstruction or gangrene: Secondary | ICD-10-CM

## 2019-11-01 DIAGNOSIS — R1310 Dysphagia, unspecified: Secondary | ICD-10-CM

## 2019-11-01 DIAGNOSIS — U071 COVID-19: Secondary | ICD-10-CM

## 2019-11-01 DIAGNOSIS — N390 Urinary tract infection, site not specified: Secondary | ICD-10-CM

## 2019-11-01 DIAGNOSIS — E7800 Pure hypercholesterolemia, unspecified: Secondary | ICD-10-CM

## 2019-11-01 DIAGNOSIS — K219 Gastro-esophageal reflux disease without esophagitis: Secondary | ICD-10-CM

## 2019-11-01 NOTE — Progress Notes
Date of Service: 11/01/2019    Teresa Trevino is a 64 y.o. female.       HPI     Teresa Trevino was seen in our office today for routine follow-up.  She is a 64 year old female followed in office by Dr. Bobette Mo.  The patient has a medical history significant for minimal  coronary artery disease, hypertension, hyperlipidemia, familial hypercholesterolemia, risk factors for CAD, breast cancer, recurrent UTIs on Cipro empirically routinely and followed by urology.  She initially presented with an abnormal CT calcium score and a CT coronary angiogram revealed moderate plaque in the LAD and mild disease in her left circumflex.  We therefore prescribed statin to control her cholesterol.  Her cholesterol is not been well controlled with statins and she has an intolerance to high-dose statins.  She therefore is working on getting PCSK9 inhibitors.  She is also followed for carcinoma of the breast treated with radiation therapy and Taxotere and carboplatin which do not typically cause cardiotoxicity.  An echocardiogram after therapy demonstrated normal left-ventricular function and we do not believe we need to be concerned about cardiotoxicity.     Her cholesterol control was very good on statins, however she did not tolerate these drugs due to myalgias and back pain.  Her family doctor arrange for her to have a PCSK9 inhibitor, however she ultimately decided she had had enough injections after her cancer treatment and is now back on rosuvastatin 5 mg 3 times a week since December 2020.  She is tolerating this dose.   Her cholesterol, however is not at target.  She has minimal coronary artery disease and risk factors and therefore needs to have her LDL below 70.  In November 2020 her LDL cholesterol was 119 with a  HDL of 40, triglycerides 85 and total cholesterol 176.  Her non-HDL cholesterol is also markedly elevated at 136.  She was prescribed Repatha at her visit with Dr. Doristine Counter in December and was waiting for notification on patient assistance after completing the appropriate paperwork.    The patient was last seen in our office on 07/24/2019.  We asked her to go ahead and get started on Repatha 140 mg injection every 14 days to help bring LDL to goal of less than 70.  She was continued on same dose of rosuvastatin and asked to get a updated lipid profile and liver enzymes in 3 months after 6 doses of the Repatha and then follow back up in our office.    Today the patient reports no complaints or concerns.  He denies cardiac symptoms.  She tells me she does have a little swelling in her ankle and feet from time to time which resolves overnight.  She tells me that she feels very well and she is active but is not exercising regularly.  She and her husband who is retired are in the process of moving from their home in [place] back to [place], they are building a house in [place] and they also recently purchased another home at the [place] of the [place].  She has been eating out more and notes she has had some weight gain since her last visit.  In our office her weight is up 2 pounds from her visit in June.  Her blood pressure today is 120/84, pulse 78 bpm.  She is not having chest pain, shortness of breath, exertional dyspnea, PND, orthopnea,  palpitations, dizziness/lightheadedness, near syncope and syncope.  She has been unable to tolerate higher doses  of statin,  but tolerating rosuvastatin 5 mg 3 times weekly without myalgias.  She is receiving the Repatha  pens request the prefilled syringes instead if that is an option.  Our nursing staff will check into this with the pharmacy and then get back to the patient.She has been working on on a cardiac healthy diet .   She is usually splitting meals with her husband when they eat out. She has cut down on her sweets to once a week.  She usually drinks one, sometimes 2, alcoholic beverages on the weekends, usually this is usually a hard seltzer.    She does have recurrent UTIs and is followed by urology at [facility].  She tells me she just finished a course of Vantin and had a cystoscopy done on 9/15 in [place].  For her breast cancer and thrombocytopenia she follows with oncology, Dr. Olin Pia at [facility], last visit 10/24/2019.  Plan is to continue to monitor and follow-up in 6 months.  She does have scheduled follow-up with radiation oncology in December.    Laboratories drawn on/14/21 show cholesterol 109 mg/dL, triglycerides 82 mg/dL, HDL 41 mg/dL, LDL 52 mg/dL.  This shows excellent control of her hypercholesterolemia with low-dose statin and Repatha 140 mg every 2 weeks.    Assessment and Plan:  1.  Familial hypercholesterolemia.  Treated.  DL at goal on current therapy with Repatha 140 mg every 2 weeks and rosuvastatin 5 mg 3 times weekly.  Her untreated cholesterol was 236 with an LDL of 172  and HDL of 37.  Treatment with low-dose statin , rosuvastatin 5 mg 3 times weekly was improved but not adequate with LDL 119 in November 2020, therefore a PCSK9 inhibitor, Repatha 140 mg every 14 days,  was added at her last visit in December.  The Repatha did not get started until her last visit in June.  She has been reluctant to start Zetia due to some prior GI issues.  Current lipid profile shows excellent control with total cholesterol 109, triglycerides 82 and LDL 52.  2.  Statin intolerance.  She has developed severe myalgias with rosuvastatin and atorvastatin.  With her severe hypercholesterolemia we do not anticipate that the less potent drugs will have adequate effectiveness.  She did have an excellent response to high-dose atorvastatin but did not tolerate.  She is tolerating low-dose rosuvastatin 5 mg 3 times weekly and Repatha without myalgias or arthralgias.    3.  Coronary artery disease.  She has mild disease on CT coronary angiogram involving LAD and mild plaque in circumflex.  Normal LV systolic function, EF 60% by echo performed on 07/17/2017.  She is asymptomatic, denies chest pain and angina symptoms.  4.  Carcinoma breast post chemotherapy and radiation therapy to right chest.  No cardiotoxicity from drugs  5.  Elevated BMI of 32.1.  6. Thrombocytopenia.  7. Breast carcinoma. Follows with oncology at [facility].   8. Recurrent UTIs. Follows with urology at [facility].   9.  Dependent lower extremity edema.  Controlled.     -No medication changes.  Continue current medical therapy.  ?Our office will check with the [facility] at [place] to see if we can get patient prefilled syringes of Repatha instead of the pens.  -Repeat fasting shows excellent control, AST and AST in 6 months.  -Asked patient to check her blood pressure once or twice a week and to contact our office if she is often having systolic readings above 140 or diastolic above 90.  ?  Reviewed and discussed blood pressure goals, target less than 135/80.  -Optimize risk factors reduction.  ?Recommend she work hard on weight reduction and try to lose 10 pounds over the next 3 months.  Suggested weight watchers diet.  Recommend reducing carbohydrates to less than 120 g a day.  -Advised to get back on the walking program and get in a minimum of 30 minutes of walking/moderate aerobic exercise 5 days a week.  -Suggested  compression socks and elevate legs when sitting to help with swelling as needed.     Follow-up: Follow-up with inter-McIntosh-James in 6 months in our [place].  Follow-up with Dr. Doristine Counter in 9 to 12 months.  The patient is encouraged to contact our office if she has problems prior to next visit.     I have educated the patient on the plan of care today. Patient verbally expressed understanding and agreement with the plan. Instructions are outlined in the after visit summary document.      Thank you for the opportunity to participate in this pleasant patient's care. Please don't hesitate to contact me with any concerns or questions.    DISEASE PREVENTION:   Lipids/Statin treatment:  Yes.   Currently on  rosuvastatin 5 mg three times weekly for Goal LDL < 70, Triglycerides < 161 mg/dl.  Lipid profile 06/18/2019: Total cholesterol is 164 mg/dL, triglycerides 096 mg/dL, HDL 41 mg/dL, LDL 045 mg/dL.     HTN: Yes. Controlled on current medical therapy, borderline in the office today.  Goal Systolic < 135, diastolic < 80.  Blood pressure today is 120/84.    Diabetes: No. .      Obesity/Nutrition/Physical Activity: BMI 32.1. .Counseled regarding healthy diet, portion control, with a goal of weight loss.        Patient education/counseling today, topics included: medication instructions, education interactions, treatment options, blood pressure monitoring, blood pressure goals,  daily weights, diet/sodium restriction, exercise, reviewed records including lab results and prior test results, follow-up plan.           DRB  Vitals:    11/01/19 1101   BP: 120/84   BP Source: Arm, Left Upper   Patient Position: Sitting   Pulse: 78   SpO2: 98%   Weight: 96 kg (211 lb 9.6 oz)   Height: 1.727 m (5' 8)   PainSc: Zero     Body mass index is 32.17 kg/m?Marland Kitchen     Past Medical History  Patient Active Problem List    Diagnosis Date Noted   ? Neuropathy 10/24/2019   ? Pelvic pain 08/08/2019   ? Gastroesophageal reflux disease 05/11/2017   ? Essential hypertension 03/02/2017   ? Thrombocytopenia (HCC) 02/16/2017   ? Coronary artery disease involving native coronary artery of native heart without angina pectoris 01/30/2017     08/2007 Exercise sestamibi MPI [place].  5.54min o9n TM, no CP. EF 59%, no ischemia  03/17  Ex Echo at [facility] - no ischemia, 6 min on TM, TDS, no CP, no ECG changes, EF normal  06/17  Coronary Artery AJ-130 Score: Total 234.  LAD 206, RCA 28  08/17 CT Cor angiogram at [place] EF 56%, chambers and valves normal, LAD-moderate concentric flat plaque, FFR 0.86; LCx mild plaque, RCA normal.  Thoracic aorta normal.     ? 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 Tetter is a caucasian female who presented to the [facility] on 01/25/2017 at age 70 for evaluation of right breast cancer. Ms. Gagne had no complaints prior to her screening mammogram. A new right breast asymmetry was present on screening mammogram. There are was suspicious on diagnostic imaging and biopsy was recommended. Right breast sono-guided biopsy 01/19/17 ([place]) revealed grade 3 invasive ductal carcinoma. Ms. Norway underwent right RSL lumpectomy/SLNB on 07/06/17. She finished radiation with Dr. Ardeth Perfect on 10/05/17.    PATHOLOGY:  Tumor:  Size/Extent of Tumor Bed: 1.2 x 0.6 cm   Size/Extent of Residual Invasive Tumor: No invasive tumor identified   Overall Residual Cellularity of the Tumor Bed: <1%   1.5 mm DCIS present  Margins Free From Tumor:  Yes  ER:  negative   PR:  negative    Her 2:  negative  Grade:  3  Lymph Nodes:  0/4  LVSI:  no  Extranodal extension:  no      BREAST IMAGING:  Mammogram:    -- Bilateral screening mammogram 12/20/16 ([place]) revealed scattered fibroglandular densities. There was a focal asymmetry present within the central posterior right breast. Additional diagnostic images and ultrasound were recommended. No abnormalities were present on the left breast.  -- Right diagnostic mammogram 01/04/17 ([place]) revealed an approximately 1.2 cm x 0.6 cm x 0.5 cm low-density mass at 4:00 with associated calcifications. This was considered suspicious and further evaluation with ultrasound will be performed.    Ultrasound:    -- Targeted right breast ultrasound 01/04/17 () revealed at 4:00 there was an irregular hypoechoic mass measuring approximately 6 mm x 9 mm x 8 mm with associated microcalcifications. This was thought to likely correlate with the mammographic abnormality and was considered somewhat suspicious. Ultrasound-guided biopsy was recommended. If the clip marker form sono guided biopsy of this mass did not correlate to the mammographic finding, additional stereotactic biopsy might be required. These findings recommendations were discussed in detail with the patient at the time of today's procedure.     REPRODUCTIVE HEALTH:  Age at first Menarche: Unknown   Age at Menopause:  Hysterectomy/BSO at 40, HRT cream for several months    PROCEDURE: Right RSL lumpectomy/SLNB, 07/06/17  PERTINENT PMH:  HTN, chronic leukopenia  FAMILY HISTORY:  Maternal Grandmother- Breast cancer dx age late 28s  PHYSICAL EXAM on PRESENTATION: Right - No palpable breast masses. No skin, nipple, or areolar change. Left - No palpable breast masses. No skin, nipple, or areolar change. No supraclavicular or axillary adenopathy.   MEDICAL ONCOLOGY:  Dr. Olin Pia NEOADJUVANT THERAPY: Taxotere/carbo completed 06/01/17  REFERRED BY:  Efraim Kaufmann [place] PA       ? Nocturia 01/2017   ? Post-void dribbling 01/2017   ? Lichen sclerosus et atrophicus of the vulva 09/22/2015   ? Agatston coronary artery calcium score between 200 and 399 08/21/2015   ? Hypercholesterolemia 08/20/2015   ? Vulvar lesion 03/12/2015   ? Recurrent UTI 09/11/2014   ? Dyspareunia, female 05/13/2013   ? Vaginal atrophy 05/13/2013   ? Urge incontinence of urine 05/13/2013   ? Mixed incontinence urge and stress (female)(female) 10/19/2011         Review of Systems   Constitutional: Negative.   HENT: Negative.    Eyes: Negative.    Cardiovascular: Positive for leg swelling.   Respiratory: Negative.    Endocrine: Negative.    Hematologic/Lymphatic: Negative.    Skin:  Negative.    Musculoskeletal: Positive for muscle cramps.   Gastrointestinal: Negative.    Genitourinary: Negative.         Frequent UTI's   Neurological: Positive for numbness and paresthesias.   Psychiatric/Behavioral: Negative.    Allergic/Immunologic: Negative.        Physical Exam  Vital signs were reviewed.   General Appearance:appears well nourished, elevated BMI, appears relaxed, in no acute distress,wearing a face mask  Skin: warm, moist, intact, no rash or lesions, no xanthomas  HEENT: unremarkable, pupils equal and round, no scleral icterus, conjunctivae and lids normal  Lips & Mouth: no pallor or cyanosis  Neck Veins: JVP normal,.   neck veins are flat, neck veins are not distended   Carotid Arteries: normal carotid upstroke bilaterally, no bruits bilaterally  Chest Inspection: chest is normal in appearance  Auscultation/Percussion/Effort: lungs clear to auscultation, no rales, rhonchi, or wheezing, respirations even and unlabored, no respiratory distress  Cardiac Rhythm: regular rhythm and normal rate   Cardiac Auscultation: normal S1 & S2, no S3 or S4, no rub   Murmurs: no cardiac murmurs   Extremities: 1+ bilateral ankle edema, , 2+ symmetric distal pulses   Abdominal Exam: soft, non-tender,non-distended, no obvious masses, bowel sounds normal, no guarding  Liver & Spleen: no organomegaly   Neurologic Exam: grossly intact, alert, moves all extremities equally   Orientation: oriented to time, person and place, clear historian  Gait: normal, steady, walks without assistance  Language & Memory: speech clear, patient responsive, seems to comprehend information          Problems Addressed Today  Encounter Diagnoses   Name Primary?   ? Hypercholesterolemia Yes   ? Agatston coronary artery calcium score between 200 and 399    ? Coronary artery disease involving native coronary artery of native heart without angina pectoris    ? Essential hypertension    ? Thrombocytopenia (HCC)                      Current Medications (including today's revisions)  ? cholecalciferol (vitamin D3) (OPTIMAL D3) 50,000 units capsule TAKE ONE CAPSULE BY MOUTH EVERY WEEK   ? clobetasoL (TEMOVATE) 0.05 % topical ointment APPLY TOPICALLY TO AFFECTED AREABID FOR 4 WEEKS THEN TAPER AS DIRECTED   ? estradioL (ESTRACE) 0.01 % (0.1 mg/g) vaginal cream Apply 0.5 gram to the urethral opening at night for 2 weeks then twice weekly   ? evolocumab (REPATHA SURECLICK) 140 mg/mL injectable PEN Inject 1 mL under the skin every 14 days.   ? hyoscyamine sulfate (LEVSIN/SL) 0.125 mg sublingual tablet Place one tablet under tongue every 4 hours as needed for Other... (Bladder spasms, urgency, pelvic pain). Max 1.5 mg/ day   ? Lactobacillus rhamnosus GG (CULTURELLE PO) Take 1 tablet by mouth daily.   ? losartan (COZAAR) 50 mg tablet Take 50 mg by mouth daily.   ? metroNIDAZOLE (METROGEL) 0.75 % topical gel Apply  topically to affected area every 48 hours. (Patient taking differently: Apply  topically to affected area every 48 hours. As needed)   ? omeprazole DR (PRILOSEC) 20 mg capsule TAKE 1 CAPSULE BY MOUTH EVERY DAY BEFORE BREAKFAST   ? rosuvastatin (CRESTOR) 5 mg tablet Take 5 mg by mouth three times weekly.

## 2019-11-04 ENCOUNTER — Encounter: Admit: 2019-11-04 | Discharge: 2019-11-04

## 2019-11-04 MED ORDER — REPATHA SYRINGE 140 MG/ML SC SYRG
140 mg | SUBCUTANEOUS | 11 refills | 28.00000 days | Status: AC
Start: 2019-11-04 — End: ?
  Filled 2019-11-05: qty 2, 28d supply, fill #1

## 2019-11-04 MED ORDER — REPATHA SYRINGE 140 MG/ML SC SYRG
140 mg | SUBCUTANEOUS | 11 refills | 28.00000 days | Status: DC
Start: 2019-11-04 — End: 2019-11-04

## 2019-11-05 ENCOUNTER — Encounter: Admit: 2019-11-05 | Discharge: 2019-11-05

## 2019-11-06 ENCOUNTER — Encounter: Admit: 2019-11-06 | Discharge: 2019-11-06

## 2019-11-06 DIAGNOSIS — N390 Urinary tract infection, site not specified: Secondary | ICD-10-CM

## 2019-11-06 DIAGNOSIS — N952 Postmenopausal atrophic vaginitis: Secondary | ICD-10-CM

## 2019-11-06 DIAGNOSIS — R102 Pelvic and perineal pain: Secondary | ICD-10-CM

## 2019-11-06 DIAGNOSIS — N3941 Urge incontinence: Secondary | ICD-10-CM

## 2019-11-06 DIAGNOSIS — R3915 Urgency of urination: Secondary | ICD-10-CM

## 2019-11-06 DIAGNOSIS — L900 Lichen sclerosus et atrophicus: Secondary | ICD-10-CM

## 2019-11-07 ENCOUNTER — Encounter: Admit: 2019-11-07 | Discharge: 2019-11-07

## 2019-11-07 DIAGNOSIS — Z0389 Encounter for observation for other suspected diseases and conditions ruled out: Secondary | ICD-10-CM

## 2019-11-07 DIAGNOSIS — U071 COVID-19: Secondary | ICD-10-CM

## 2019-11-07 DIAGNOSIS — R1310 Dysphagia, unspecified: Secondary | ICD-10-CM

## 2019-11-07 DIAGNOSIS — D693 Immune thrombocytopenic purpura: Secondary | ICD-10-CM

## 2019-11-07 DIAGNOSIS — I10 Essential (primary) hypertension: Secondary | ICD-10-CM

## 2019-11-07 DIAGNOSIS — N904 Leukoplakia of vulva: Secondary | ICD-10-CM

## 2019-11-07 DIAGNOSIS — E785 Hyperlipidemia, unspecified: Secondary | ICD-10-CM

## 2019-11-07 DIAGNOSIS — Z9289 Personal history of other medical treatment: Secondary | ICD-10-CM

## 2019-11-07 DIAGNOSIS — D72819 Decreased white blood cell count, unspecified: Secondary | ICD-10-CM

## 2019-11-07 DIAGNOSIS — K449 Diaphragmatic hernia without obstruction or gangrene: Secondary | ICD-10-CM

## 2019-11-07 DIAGNOSIS — K219 Gastro-esophageal reflux disease without esophagitis: Secondary | ICD-10-CM

## 2019-11-07 DIAGNOSIS — L900 Lichen sclerosus et atrophicus: Secondary | ICD-10-CM

## 2019-11-07 DIAGNOSIS — N390 Urinary tract infection, site not specified: Secondary | ICD-10-CM

## 2019-11-07 DIAGNOSIS — Z9221 Personal history of antineoplastic chemotherapy: Secondary | ICD-10-CM

## 2019-11-07 DIAGNOSIS — C50919 Malignant neoplasm of unspecified site of unspecified female breast: Secondary | ICD-10-CM

## 2019-11-07 DIAGNOSIS — Z923 Personal history of irradiation: Secondary | ICD-10-CM

## 2019-11-07 DIAGNOSIS — I2510 Atherosclerotic heart disease of native coronary artery without angina pectoris: Secondary | ICD-10-CM

## 2019-11-07 DIAGNOSIS — R32 Unspecified urinary incontinence: Secondary | ICD-10-CM

## 2019-11-19 ENCOUNTER — Encounter: Admit: 2019-11-19 | Discharge: 2019-11-19

## 2019-11-19 ENCOUNTER — Ambulatory Visit: Admit: 2019-11-19 | Discharge: 2019-11-20 | Payer: Private Health Insurance - Indemnity

## 2019-11-19 DIAGNOSIS — N958 Other specified menopausal and perimenopausal disorders: Secondary | ICD-10-CM

## 2019-11-19 DIAGNOSIS — R32 Unspecified urinary incontinence: Secondary | ICD-10-CM

## 2019-11-19 DIAGNOSIS — Z9221 Personal history of antineoplastic chemotherapy: Secondary | ICD-10-CM

## 2019-11-19 DIAGNOSIS — R1310 Dysphagia, unspecified: Secondary | ICD-10-CM

## 2019-11-19 DIAGNOSIS — K219 Gastro-esophageal reflux disease without esophagitis: Secondary | ICD-10-CM

## 2019-11-19 DIAGNOSIS — D693 Immune thrombocytopenic purpura: Secondary | ICD-10-CM

## 2019-11-19 DIAGNOSIS — E785 Hyperlipidemia, unspecified: Secondary | ICD-10-CM

## 2019-11-19 DIAGNOSIS — N904 Leukoplakia of vulva: Secondary | ICD-10-CM

## 2019-11-19 DIAGNOSIS — Z923 Personal history of irradiation: Secondary | ICD-10-CM

## 2019-11-19 DIAGNOSIS — C50919 Malignant neoplasm of unspecified site of unspecified female breast: Secondary | ICD-10-CM

## 2019-11-19 DIAGNOSIS — D72819 Decreased white blood cell count, unspecified: Secondary | ICD-10-CM

## 2019-11-19 DIAGNOSIS — L900 Lichen sclerosus et atrophicus: Secondary | ICD-10-CM

## 2019-11-19 DIAGNOSIS — Z0389 Encounter for observation for other suspected diseases and conditions ruled out: Secondary | ICD-10-CM

## 2019-11-19 DIAGNOSIS — N390 Urinary tract infection, site not specified: Secondary | ICD-10-CM

## 2019-11-19 DIAGNOSIS — Z9289 Personal history of other medical treatment: Secondary | ICD-10-CM

## 2019-11-19 DIAGNOSIS — K449 Diaphragmatic hernia without obstruction or gangrene: Secondary | ICD-10-CM

## 2019-11-19 DIAGNOSIS — I10 Essential (primary) hypertension: Secondary | ICD-10-CM

## 2019-11-19 DIAGNOSIS — I2510 Atherosclerotic heart disease of native coronary artery without angina pectoris: Secondary | ICD-10-CM

## 2019-11-19 DIAGNOSIS — U071 COVID-19: Secondary | ICD-10-CM

## 2019-11-19 MED ORDER — ESTRADIOL 0.01 % (0.1 MG/GRAM) VA CREA
11 refills | 30.00000 days | Status: AC
Start: 2019-11-19 — End: ?

## 2019-11-19 MED ORDER — CLOBETASOL 0.05 % TP OINT
Freq: Two times a day (BID) | OPHTHALMIC | 1 refills | 30.00000 days | Status: AC | PRN
Start: 2019-11-19 — End: ?

## 2019-11-19 NOTE — Progress Notes
Date of Service: 11/19/2019    Subjective:             Teresa Trevino is a 64 y.o. female.    History of Present Illness  LS on clobetasol and GSM on topical vaginal estrogen  Recurrent utis. Urology recommends metrogel alternating with estrogen cream  Burning with micturition hx breast cancer   no pruritus, pain, bleeding, discharge       Review of Systems   Constitutional: Negative.  Negative for fatigue, fever and unexpected weight change.   HENT: Negative.    Eyes: Negative.    Respiratory: Negative.  Negative for cough and shortness of breath.    Cardiovascular: Negative.  Negative for chest pain and leg swelling.   Gastrointestinal: Negative.  Negative for abdominal pain, blood in stool, constipation, diarrhea, nausea and vomiting.   Endocrine: Negative.    Genitourinary: Positive for enuresis and frequency. Negative for difficulty urinating, dyspareunia, dysuria, genital sores, hematuria, menstrual problem, pelvic pain, urgency, vaginal bleeding, vaginal discharge and vaginal pain.   Musculoskeletal: Negative.  Negative for arthralgias and back pain.   Skin: Negative.    Allergic/Immunologic: Negative.    Neurological: Negative.  Negative for light-headedness and headaches.   Hematological: Negative.  Negative for adenopathy. Does not bruise/bleed easily.   Psychiatric/Behavioral: Negative.  Negative for confusion. The patient is not nervous/anxious.    All other systems reviewed and are negative.      Medical History:   Diagnosis Date   ? Breast cancer (HCC) 01/2017    right IDC   ? Chronic leukopenia    ? Coronary artery disease    ? COVID-19 10/2018   ? Difficulty swallowing     past issue during chemotherapy-no longer an issue per pt   ? Dyslipidemia    ? GERD (gastroesophageal reflux disease)    ? Hiatal hernia    ? History of blood transfusion     during chemo therapy   ? History of chemotherapy    ? History of EKG 01/2017   ? History of radiation therapy    ? Hyperlipemia 08/20/2015   ? Hypertension ? Incontinence    ? 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 Lichen sclerosus    ? Lichen sclerosus et atrophicus of the vulva 09/22/2015   ? Observation for suspected cardiovascular disease 08/20/2015    04/21/15  Stress Echo :  1.  No exercise induced chest pain.  2.  No ECG evidence of ischemia.  3.  No echocardiographic evidence of ischemia.  4.  Normal  Hemodynamic response to exercise.  5.  No significant exercise induced arrhythmias.  6.  Low risk scan.  7.  Consider medical management if indicated.   ? Urinary tract infection        Surgical History:   Procedure Laterality Date   ? TONSILLECTOMY  1977   ? HX HYSTERECTOMY  2008   ? BLADDER REPAIR  2008    [facility]   ? BLADDER SURGERY  2013    bladder sling   ? CT ANGIOGRAM (NON-INVASIVE)  09/2015   ? UPPER GASTROINTESTINAL ENDOSCOPY  05/26/2017   ? RIGHT RADIOACTIVE SEED LOCALIZED LUMPECTOMY Right 07/06/2017    Performed by Ruthy Dick, DO at [facility] OR   ? IDENTIFICATION SENTINEL LYMPH NODE Right 07/06/2017    Performed by Ruthy Dick, DO at [facility] OR   ? INJECTION RADIOACTIVE TRACER FOR SENTINEL NODE IDENTIFICATION Right 07/06/2017  Performed by Ruthy Dick, DO at [facility] OR   ? SENTINEL LYMPH NODE BIOPSY Right 07/06/2017    Performed by Ruthy Dick, DO at [facility] OR   ? COLONOSCOPY DIAGNOSTIC WITH SPECIMEN COLLECTION BY BRUSHING/ WASHING - FLEXIBLE N/A 02/01/2018    Performed by Virgina Organ, MD at [facility] OR   ? ESOPHAGOGASTRODUODENOSCOPY WITH BIOPSY - FLEXIBLE N/A 02/01/2018    Performed by Virgina Organ, MD at [facility] OR   ? COLONOSCOPY     ? FOOT SURGERY         Social History     Tobacco Use   ? Smoking status: Never Smoker   ? Smokeless tobacco: Never Used   Substance Use Topics   ? Alcohol use: Yes     Alcohol/week: 4.0 standard drinks     Types: 2 Shots of liquor, 2 Standard drinks or equivalent per week     Comment: 2 drinks per week   ? Drug use: No       Family History   Problem Relation Age of Onset   ? Diabetes Mother    ? Hypertension Mother    ? High Cholesterol Mother    ? Stroke Mother    ? Thyroid Disease Mother    ? Kidney Failure Mother         on HD   ? Diabetes Father         since age 43   ? Heart Failure Father    ? Diabetes Paternal Uncle    ? Cancer Maternal Grandmother    ? Diabetes Maternal Grandfather    ? None Reported Sister          Objective:         ? cholecalciferol (vitamin D3) (OPTIMAL D3) 50,000 units capsule TAKE ONE CAPSULE BY MOUTH EVERY WEEK   ? clobetasoL (TEMOVATE) 0.05 % topical ointment Apply BID x 2-4 weeks and taper to TIW prn   ? estradioL (ESTRACE) 0.01 % (0.1 mg/g) vaginal cream Apply 0.5 gram to the urethral opening at night for 2 weeks then twice weekly   ? evolocumab (REPATHA SYRINGE) 140 mg/mL injectable SYRINGE Inject 1 mL under the skin every 14 days.   ? hyoscyamine sulfate (LEVSIN/SL) 0.125 mg sublingual tablet Place one tablet under tongue every 4 hours as needed for Other... (Bladder spasms, urgency, pelvic pain). Max 1.5 mg/ day   ? Lactobacillus rhamnosus GG (CULTURELLE PO) Take 1 tablet by mouth daily.   ? losartan (COZAAR) 50 mg tablet Take 50 mg by mouth daily.   ? metroNIDAZOLE (METROGEL) 0.75 % topical gel Apply  topically to affected area every 48 hours. (Patient taking differently: Apply  topically to affected area every 48 hours. As needed)   ? omeprazole DR (PRILOSEC) 20 mg capsule TAKE 1 CAPSULE BY MOUTH EVERY DAY BEFORE BREAKFAST   ? rosuvastatin (CRESTOR) 5 mg tablet Take 5 mg by mouth three times weekly.     Vitals:    11/19/19 1045 11/19/19 1053   BP: (!) 144/81 131/79   Pulse: 85    Resp: 16    Temp: 36.1 ?C (97 ?F)    SpO2: 98%    Weight: 93.9 kg (207 lb)    Height: 172.7 cm (68)    PainSc: Zero      Body mass index is 31.47 kg/m?Marland Kitchen     Physical Exam  Normal external female genitalia  Vulva wnl  Vagina physiologic d/c no lesions  Cervix uterus adnexa absent  Assessment and Plan:  GSM with evidence severe periurethral atrophy  Recommend apply estrogen cream to the urethra- showed with a mirror where to apply, approx 4 cm inside from labia majora    LS in remission  Renewed clobetasol,E2 cream  Urged her to follow through and begin metrogel recommended by urology  Check HSV PCR erosion urethra

## 2019-11-19 NOTE — Progress Notes
11/18/19     Copy of CT report, 08/29/19 for review in my inbox to review    Reviewed chart, noted CT Scan 08/29/19, report had already been scanned in chart    I also had already documented in chart about report        Reviewed Dr Rebeca Allegra note 10/30/19  --Cystoscopy unremarkable    --CT images from 08/29/19, were still not available for review, he requested again      I checked chart, CT images not noted in Imaging tab, however, I checked RAD PACS and images are there for review      Future Appointments   Date Time Provider Department Center   12/25/2019 10:00 AM Shanon Ace, MD [facility] IM   01/22/2020 11:30 AM Reola Mosher, MD [id number] [place] [facility]   02/11/2020 11:20 AM Marshia Ly, PA-C [facility]   04/06/2020 11:00 AM McIntosh-James, Ginger E, APRN-NP [facility] Exam   04/23/2020 11:00 AM Alwyn Pea, DO [facility] Exam

## 2019-11-20 DIAGNOSIS — N766 Ulceration of vulva: Principal | ICD-10-CM

## 2019-12-03 ENCOUNTER — Encounter: Admit: 2019-12-03 | Discharge: 2019-12-03

## 2019-12-03 DIAGNOSIS — R300 Dysuria: Secondary | ICD-10-CM

## 2019-12-03 DIAGNOSIS — N390 Urinary tract infection, site not specified: Secondary | ICD-10-CM

## 2019-12-06 ENCOUNTER — Encounter: Admit: 2019-12-06 | Discharge: 2019-12-06

## 2019-12-06 DIAGNOSIS — N390 Urinary tract infection, site not specified: Secondary | ICD-10-CM

## 2019-12-06 DIAGNOSIS — R300 Dysuria: Secondary | ICD-10-CM

## 2019-12-06 DIAGNOSIS — R3915 Urgency of urination: Secondary | ICD-10-CM

## 2019-12-06 MED ORDER — CEFPODOXIME 200 MG PO TAB
200 mg | ORAL_TABLET | Freq: Two times a day (BID) | ORAL | 0 refills | 7.00000 days | Status: AC
Start: 2019-12-06 — End: ?

## 2019-12-06 MED ORDER — HYOSCYAMINE SULFATE 0.125 MG SL SUBL
125 ug | ORAL_TABLET | SUBLINGUAL | 3 refills | Status: AC | PRN
Start: 2019-12-06 — End: ?

## 2019-12-06 MED ORDER — PHENAZOPYRIDINE 200 MG PO TAB
200 mg | ORAL_TABLET | Freq: Three times a day (TID) | ORAL | 0 refills | Status: AC | PRN
Start: 2019-12-06 — End: ?

## 2019-12-06 NOTE — Telephone Encounter
Spoke with Huntley Dec regarding culture results done at [facility] in [place]. Results are in according to patient portal     She states she is traveling & requests abx be sent to [facility], [place], [place] phone number [phone], Not her usual pharmacy.    Informed her I will send her message to [facility] Gershon Cull.

## 2019-12-06 NOTE — Telephone Encounter
My Chart message sent

## 2019-12-06 NOTE — Progress Notes
Chart update        Orders Placed This Encounter   ? CULTURE-URINE W/SENSITIVITY   ? URINALYSIS, MICROSCOPIC   ? phenazopyridine (PYRIDIUM) 200 mg tablet   ? hyoscyamine sulfate (LEVSIN/SL) 0.125 mg sublingual tablet   ? cefpodoxime Varney Baas) 200 mg tablet       [facility] [place], [place] - [address]  Phone: [phone] Fax: [phone]      Me  to Teresa Trevino, Oneel    BB  ? 12/06/19 6:47 PM  Ms.Day,  ?  I reviewed your messages with Luther Parody today and your recent follow up with Dr Garlan Fair.  ?  The irritation may also be from the vaginal tissue changes with your history of lichen sclerosis.   - are you using the vaginal estrogen cream?  - are you using the vaginal metrogel, on alternating days?  - are you using the clobetasol from Dr Garlan Fair?  - I can order the same antibiotic that you took recently, Vantin   - if possible, could you repeat the urine culture before you start the antibiotic?  - are you using Levsin, the medicine that goes under your tongue? This can help with urgency/frequency and bladder spasms that can feel like a burning sensation  - have you ever taken Pyridium? Turns your urine orange/red to help with burning and pain.  It is a medication that you use for a short course  ?  I will send the antibiotic, refill of levsin and Pyridium to your pharmacy  ?  Send an update on Monday.  ?  ?  Thank you ~  Gershon Cull, PA-C  Department of Urology  [facility]  [address]  [place], [place] [address]  Phone [phone]??? fax [phone]  ?  ?         Future Appointments   Date Time Provider Department Center   12/25/2019 10:00 AM Shanon Ace, MD MPAPDERM IM   01/22/2020 11:30 AM Reola Mosher, MD UKCCNTHRADTH [place] [facility]   02/11/2020 11:20 AM Marshia Ly, PA-C [facility] Urology   04/06/2020 11:00 AM McIntosh-James, Ginger E, APRN-NP MACSTJOECL [facility] Exam   04/23/2020 11:00 AM Alwyn Pea, DO [facility] [place] Exam ?  ?    This MyChart message has not been read.  Christeson, Luther Parody  to Me    CC  ? 12/06/19 5:04 PM  She is being persistent about abx.     Thank you - Teresa Trevino, Caitlin  to Tranise, Tuazon    CC  ? 12/06/19 5:03 PM  Did this start right after starting the Metrogel? Would you be able to repeat the urine culture?   ?  All the best,   Caitlin    This MyChart message has not been read.  Kierria, Whirley  to Me      ? 12/06/19 4:52 PM  Caitlin,  I assume urethral because of the terrible burning, urgency, frequency.   That?s all I can say.  No itching.  Christeson, Caitlin  to Laurenmarie, Mylin    CC  ? 12/06/19 3:51 PM  Hi Huntley Dec,   ?  Thank you for sending this! Do you think by chance you may have a yeast infection? Is your burning strictly urethral, or more vaginal?  ?  All the best,   Luther Parody    Last read by Bonnielee Haff at 4:48 PM on  12/06/2019.  Angelique, Chevalier  to Me      ? 12/06/19 3:35 PM  Caitlin,  I am sending the total test results from my UA which I screen shot from my [facility] portal results.   My WBC and RBC in the urine is concerning.   Although the bacteria count is less, looks like other things are going on.  Please take another look.    Thank you.  Tinia R  Attachments    [id number].Marland KitchenMarland Kitchen

## 2019-12-09 ENCOUNTER — Encounter: Admit: 2019-12-09 | Discharge: 2019-12-09

## 2019-12-09 DIAGNOSIS — Z9889 Other specified postprocedural states: Secondary | ICD-10-CM

## 2019-12-09 DIAGNOSIS — Z1231 Encounter for screening mammogram for malignant neoplasm of breast: Secondary | ICD-10-CM

## 2019-12-09 DIAGNOSIS — C50311 Malignant neoplasm of lower-inner quadrant of right female breast: Secondary | ICD-10-CM

## 2019-12-09 MED FILL — REPATHA SYRINGE 140 MG/ML SC SYRG: 140 mg/mL | SUBCUTANEOUS | 28 days supply | Qty: 2 | Fill #2 | Status: AC

## 2019-12-12 ENCOUNTER — Encounter: Admit: 2019-12-12 | Discharge: 2019-12-12

## 2019-12-12 DIAGNOSIS — K219 Gastro-esophageal reflux disease without esophagitis: Secondary | ICD-10-CM

## 2019-12-12 MED ORDER — OMEPRAZOLE 20 MG PO CPDR
ORAL_CAPSULE | Freq: Every day | 1 refills | Status: AC
Start: 2019-12-12 — End: ?

## 2019-12-12 NOTE — Telephone Encounter
Refill request received for omeprazole  Last OV 01/16/2019  Last fill 07/01/19 90 caps w/1 refill    Routing to Volney American, APRN for approval/refusal

## 2019-12-25 ENCOUNTER — Encounter: Admit: 2019-12-25 | Discharge: 2019-12-25

## 2019-12-25 ENCOUNTER — Ambulatory Visit: Admit: 2019-12-25 | Discharge: 2019-12-26 | Payer: Private Health Insurance - Indemnity

## 2019-12-25 DIAGNOSIS — N390 Urinary tract infection, site not specified: Secondary | ICD-10-CM

## 2019-12-25 DIAGNOSIS — N904 Leukoplakia of vulva: Secondary | ICD-10-CM

## 2019-12-25 DIAGNOSIS — D229 Melanocytic nevi, unspecified: Secondary | ICD-10-CM

## 2019-12-25 DIAGNOSIS — I2510 Atherosclerotic heart disease of native coronary artery without angina pectoris: Secondary | ICD-10-CM

## 2019-12-25 DIAGNOSIS — R1310 Dysphagia, unspecified: Secondary | ICD-10-CM

## 2019-12-25 DIAGNOSIS — Z0389 Encounter for observation for other suspected diseases and conditions ruled out: Secondary | ICD-10-CM

## 2019-12-25 DIAGNOSIS — L821 Other seborrheic keratosis: Secondary | ICD-10-CM

## 2019-12-25 DIAGNOSIS — L570 Actinic keratosis: Secondary | ICD-10-CM

## 2019-12-25 DIAGNOSIS — Z9221 Personal history of antineoplastic chemotherapy: Secondary | ICD-10-CM

## 2019-12-25 DIAGNOSIS — U071 COVID-19: Secondary | ICD-10-CM

## 2019-12-25 DIAGNOSIS — Z9289 Personal history of other medical treatment: Secondary | ICD-10-CM

## 2019-12-25 DIAGNOSIS — L820 Inflamed seborrheic keratosis: Secondary | ICD-10-CM

## 2019-12-25 DIAGNOSIS — D72819 Decreased white blood cell count, unspecified: Secondary | ICD-10-CM

## 2019-12-25 DIAGNOSIS — L900 Lichen sclerosus et atrophicus: Secondary | ICD-10-CM

## 2019-12-25 DIAGNOSIS — E785 Hyperlipidemia, unspecified: Secondary | ICD-10-CM

## 2019-12-25 DIAGNOSIS — K449 Diaphragmatic hernia without obstruction or gangrene: Secondary | ICD-10-CM

## 2019-12-25 DIAGNOSIS — Z923 Personal history of irradiation: Secondary | ICD-10-CM

## 2019-12-25 DIAGNOSIS — K219 Gastro-esophageal reflux disease without esophagitis: Secondary | ICD-10-CM

## 2019-12-25 DIAGNOSIS — C50919 Malignant neoplasm of unspecified site of unspecified female breast: Secondary | ICD-10-CM

## 2019-12-25 DIAGNOSIS — R32 Unspecified urinary incontinence: Secondary | ICD-10-CM

## 2019-12-25 DIAGNOSIS — I10 Essential (primary) hypertension: Secondary | ICD-10-CM

## 2019-12-25 DIAGNOSIS — D693 Immune thrombocytopenic purpura: Secondary | ICD-10-CM

## 2019-12-25 NOTE — Patient Instructions
Vitamin D  - We recommend Vitamin D supplementation after a fatty meal, especially if there is no contraindications such as kidney stones    Moles  --Common melanocytic nevi (moles) tend to be =6 mm in diameter and symmetric with even pigmentation, round or oval shape, regular outline, and sharp, non-fuzzy border.  --Dermal nevi stick out from the skin, but if they are soft they are usually not worrisome.  --Flaky seemingly stuck-on brown bumps are usually benign keratoses, not moles.  --Bright red smooth bumps that do not bleed are usually benign blood vessel lesions (cherry angiomas), not moles.  --Atypical nevi/clinical features of possible melanoma include asymmetry, border irregularities, color variability, and diameter >6 mm.  The earliest sign of melanoma is usually a rapidly growing mole.  --About half of melanoma arises in an existing mole and up to half on normal skin.  --It is normal to get new moles until the age of 7-45 years old.  --Multiple atypical nevi are a marker of increased risk of melanoma. The risk of melanoma depends also upon the total number of nevi, family and/or personal history of melanoma, and sun exposure history.   --It is important to look at your moles once a month.  It may help to follow them with photos such as on your smart phone.  Looking once a month you can notice rapid changes and call if these occur.  --Using sunscreens and sun avoidance will decrease your risk of developing melanoma.  The best sun protection is sun avoidance, including with clothing such as long sleeves and a broad brimmed hat.  The best sunscreens are SPF 30 or above cream based with zinc oxide.  One ounce (shot glass sized) amount is needed for an adult.  It should be reapplied every 2 hours if possible.  --Any tanning bed use will increase your risk of melanoma significantly.    Sun Protection   UPF/SPF rated clothing (gloves, long sleeves, scarves); broad-brimmed hats (NOT ball caps!)   [employer] additive can increase the SPF value of your everyday clothing   Cowboy hats protect from sun; baseball hats don't   Here are several recommended zinc-based sunscreen brands in aplphabetical order (always read the ingredient list, as many brands have multiple varieties of sunscreens and not all are zinc-based)   Cyndia Bent, Coca Cola, [employer], Culp, Countryside, Goddess Garden, Green Screen,  Mc[employer], Forensic scientist, SkinCeuticals, Vanicream   2 shot-glases = whole body    Melanoma Patient Information    Also called malignant melanoma     Skin cancer screening: If you notice a mole that differs from others or one that changes, bleeds, or itches, see a dermatologist.   Melanoma is a type of skin cancer. Anyone can get melanoma. When found early and treated, the cure rate is nearly 100%. Allowed to grow, melanoma can spread to other parts of the body. Melanoma can spread quickly. When melanoma spreads, it can be deadly.Dermatologists believe that the number of deaths from melanoma would be much lower if people:  Knew the warning signs of melanoma.   Learned how to examine their skin for signs of skin cancer.   Took the time to examine their skin.   It's important to take time to look at the moles on your skin because this is a good way to find melanoma early. When checking your skin, you should look for the ABCDEs of melanoma.     ABCDE's of melanoma:  When performing monthly  skin exams for your moles or new moles, remember the ABCDE's of melanoma:    A - Asymmetry. (Concerning if spot is not symmetric)  B - Border. (Irregular border or notched border are concerning)  C - Color. (Multiple colors or changes in color are concerning.)  D - Diameter. (Larger than 6mm, ie, a pencil eraser, is concerning.)  E - Evolution. (An evolving or changing spot is concerning. If new itch, tenderness, or bleeding develop, these are concerning changes too.  See further explanation below:    Melanoma: Signs and symptoms Anyone can get melanoma. It's important to take time to look at the moles on your skin because this is a good way to find melanoma early. When checking your skin, you should look for the ABCDEs of melanoma.     ABCDEs of melanoma     A = Asymmetry  One half is unlike the other half.       B = Border  An irregular, scalloped, or poorly defined border.       C = Color  Is varied from one area to another; has shades of tan, brown or black, or is sometimes white, red, or blue.       D = Diameter  Melanomas usually greater than 6mm (the size of a pencil eraser) when diagnosed, but they can be smaller.       E = Evolving  A mole or skin lesion that looks different from the rest or is changing in size, shape, or color.    !! If you see a mole or new spot on your skin that has any of the ABCDEs, immediately make an appointment to see a dermatologist.    Signs of melanoma  The most common early signs (what you see) of melanoma are:     Growing mole on your skin.   Unusual looking mole on your skin or a mole that does not look like any other mole on your skin (the ugly duckling).   Non-uniform mole (has an odd shape, uneven or uncertain border, different colors).     Symptoms of melanoma  In the early stages, melanoma may not cause any symptoms (what you feel). But sometimes melanoma will:    Itch.    Bleed.    Feel painful.   Many melanomas have these signs and symptoms, but not all. There are different types of melanoma. One type can first appear as a brown or black streak underneath a fingernail or toenail. Melanoma also can look like a bruise that just won't heal.     Who gets melanoma?  Anyone can get melanoma. Most people who get it have light skin, but people who have brown and black skin also get melanoma.   Some people have a higher risk of getting melanoma. These people have the following traits:   Skin    Fair skin (The risk is higher if the person also has red or blond hair and blue or green eyes). Sun-sensitive skin (rarely tans or burns easily).    50-plus moles, large moles, or unusual-looking moles.    If you have had bad sunburns or spent time tanning (sun, tanning beds, or sun lamps), you also have a higher risk of getting melanoma.   Men older than 50 are at a higher risk for developing skin cancers, including melanoma. Learning how to check your skin and getting skin exams can help detect skin cancer.    Family/medical history   Melanoma runs  in the family (parent, child, sibling, cousin, aunt, uncle had melanoma).   You had another skin cancer, but most especially another melanoma.   A weakened immune system.      Research shows that indoor tanning increases a person's melanoma risk by 75%. The risk also may increase if you had breast or thyroid cancer.    More people getting melanoma  Fewer people are getting most types of cancer. Melanoma is different. More people are getting melanoma. Many are white men who are 50 years or older. More young people also are getting melanoma. Melanoma is now the most common cancer among people 19-66 years old. Even teenagers are getting melanoma.    What causes melanoma?  Ultraviolet (UV) radiation is a major contributor in most cases. We get UV radiation from the sun, tanning beds, and sun lamps. Heredity also plays a role. Research shows that if a close blood relative (parent, child, sibling, aunt, uncle) had melanoma, a person has a much greater risk of getting melanoma.     How do dermatologists diagnose melanoma?  To diagnose melanoma, a dermatologist begins by looking at the patient's skin. A dermatologist will carefully examine moles and other suspicious spots. To get a better look, a dermatologist may use a device called a dermoscope.      Vitamin D    Our bodies need vitamin D to build strong and healthy bones. Vitamin D helps the body absorb the calcium that our bones require.     For a healthy person, the recommended daily dietary allowance is 600 international units for people of 56-6 years old, and 800 international units for people older than 71 years. More vitamin D is not better. Higher amounts of vitamin D could be harmful, leading to many health problems such as high blood pressure and kidney damage.     American academy of Dermatology recommending everyone get vitamin D from foods naturally rich in vitamin D, foods and beverages fortified with vitamin D or vitamin D supplements. The foods that contain the greatest amount are fatty fishes such as salmon, tuna and mackerel. Fish liver oil is another good source.     One of the sources to look up vitamin amount is through [employer]. PrankTips.hu. This can help you find out whether you get enough vitamin D from your diet. If you are like many people, you may not be getting your recommended dietary allowance of vitamin D. You may want to change the foods that you eat or take a vitamin D supplements. Before you start taking a vitamin D supplement, talk with your doctor.     Vitamin D is produced in the skin by UV light, but the amount is highly variable and depends on many factors. However, getting vitamin D from the sun or tanning beds can 1) increase your risk of developing skin cancer including melanoma which can be deadly, 2) resulting premature skin aging (wrinkles, age spots, blotchy complexion) and 3) leading to a weakened immune system. Therefore, Teacher, music of Dermatology recommend getting vitamin D safely from foods, beverages and supplements.

## 2019-12-25 NOTE — Progress Notes
ATTESTATION    I personally performed the key portions of the E/M visit, discussed case with resident and concur with resident documentation of history, physical exam, assessment, and treatment plan unless otherwise noted.    I performed cryotherapy today: liquid nitrogen was applied for 10-12 seconds to the skin lesions and the expected blistering or scabbing reaction explained. Pt instructed not to pick at the area(s) and that hypopigmented scars may result from the procedure. Return if lesion fails to fully resolve. Patient tolerated procedure well, no complications.     Staff name:  Devona Konig, MD Date:  12/25/2019

## 2019-12-26 DIAGNOSIS — N952 Postmenopausal atrophic vaginitis: Secondary | ICD-10-CM

## 2020-01-07 ENCOUNTER — Encounter: Admit: 2020-01-07 | Discharge: 2020-01-07

## 2020-01-07 MED FILL — REPATHA SYRINGE 140 MG/ML SC SYRG: 140 mg/mL | SUBCUTANEOUS | 28 days supply | Qty: 2 | Fill #3 | Status: AC

## 2020-01-15 ENCOUNTER — Ambulatory Visit: Admit: 2020-01-15 | Discharge: 2020-01-15 | Payer: Private Health Insurance - Indemnity

## 2020-01-16 ENCOUNTER — Encounter: Admit: 2020-01-16 | Discharge: 2020-01-16

## 2020-01-16 MED ORDER — REPATHA SYRINGE 140 MG/ML SC SYRG
140 mg | SUBCUTANEOUS | 11 refills | Status: CN
Start: 2020-01-16 — End: ?

## 2020-01-22 ENCOUNTER — Ambulatory Visit: Admit: 2020-01-22 | Discharge: 2020-01-22

## 2020-01-22 ENCOUNTER — Encounter: Admit: 2020-01-22 | Discharge: 2020-01-22

## 2020-01-22 DIAGNOSIS — L900 Lichen sclerosus et atrophicus: Secondary | ICD-10-CM

## 2020-01-22 DIAGNOSIS — N904 Leukoplakia of vulva: Secondary | ICD-10-CM

## 2020-01-22 DIAGNOSIS — I10 Essential (primary) hypertension: Secondary | ICD-10-CM

## 2020-01-22 DIAGNOSIS — K219 Gastro-esophageal reflux disease without esophagitis: Secondary | ICD-10-CM

## 2020-01-22 DIAGNOSIS — R1310 Dysphagia, unspecified: Secondary | ICD-10-CM

## 2020-01-22 DIAGNOSIS — Z9289 Personal history of other medical treatment: Secondary | ICD-10-CM

## 2020-01-22 DIAGNOSIS — Z0389 Encounter for observation for other suspected diseases and conditions ruled out: Secondary | ICD-10-CM

## 2020-01-22 DIAGNOSIS — E785 Hyperlipidemia, unspecified: Secondary | ICD-10-CM

## 2020-01-22 DIAGNOSIS — Z9221 Personal history of antineoplastic chemotherapy: Secondary | ICD-10-CM

## 2020-01-22 DIAGNOSIS — I2510 Atherosclerotic heart disease of native coronary artery without angina pectoris: Secondary | ICD-10-CM

## 2020-01-22 DIAGNOSIS — U071 COVID-19: Secondary | ICD-10-CM

## 2020-01-22 DIAGNOSIS — Z923 Personal history of irradiation: Secondary | ICD-10-CM

## 2020-01-22 DIAGNOSIS — N390 Urinary tract infection, site not specified: Secondary | ICD-10-CM

## 2020-01-22 DIAGNOSIS — D72819 Decreased white blood cell count, unspecified: Secondary | ICD-10-CM

## 2020-01-22 DIAGNOSIS — K449 Diaphragmatic hernia without obstruction or gangrene: Secondary | ICD-10-CM

## 2020-01-22 DIAGNOSIS — D693 Immune thrombocytopenic purpura: Secondary | ICD-10-CM

## 2020-01-22 DIAGNOSIS — R32 Unspecified urinary incontinence: Secondary | ICD-10-CM

## 2020-01-22 DIAGNOSIS — C50311 Malignant neoplasm of lower-inner quadrant of right female breast: Secondary | ICD-10-CM

## 2020-01-22 DIAGNOSIS — C50919 Malignant neoplasm of unspecified site of unspecified female breast: Secondary | ICD-10-CM

## 2020-01-23 ENCOUNTER — Encounter: Admit: 2020-01-23 | Discharge: 2020-01-23

## 2020-01-23 DIAGNOSIS — I10 Essential (primary) hypertension: Secondary | ICD-10-CM

## 2020-01-23 DIAGNOSIS — K449 Diaphragmatic hernia without obstruction or gangrene: Secondary | ICD-10-CM

## 2020-01-23 DIAGNOSIS — N904 Leukoplakia of vulva: Secondary | ICD-10-CM

## 2020-01-23 DIAGNOSIS — D693 Immune thrombocytopenic purpura: Secondary | ICD-10-CM

## 2020-01-23 DIAGNOSIS — Z9221 Personal history of antineoplastic chemotherapy: Secondary | ICD-10-CM

## 2020-01-23 DIAGNOSIS — C50919 Malignant neoplasm of unspecified site of unspecified female breast: Secondary | ICD-10-CM

## 2020-01-23 DIAGNOSIS — K219 Gastro-esophageal reflux disease without esophagitis: Secondary | ICD-10-CM

## 2020-01-23 DIAGNOSIS — D72819 Decreased white blood cell count, unspecified: Secondary | ICD-10-CM

## 2020-01-23 DIAGNOSIS — R32 Unspecified urinary incontinence: Secondary | ICD-10-CM

## 2020-01-23 DIAGNOSIS — E785 Hyperlipidemia, unspecified: Principal | ICD-10-CM

## 2020-01-23 DIAGNOSIS — Z9289 Personal history of other medical treatment: Secondary | ICD-10-CM

## 2020-01-23 DIAGNOSIS — N390 Urinary tract infection, site not specified: Secondary | ICD-10-CM

## 2020-01-23 DIAGNOSIS — Z923 Personal history of irradiation: Secondary | ICD-10-CM

## 2020-01-23 DIAGNOSIS — Z0389 Encounter for observation for other suspected diseases and conditions ruled out: Secondary | ICD-10-CM

## 2020-01-23 DIAGNOSIS — R1310 Dysphagia, unspecified: Secondary | ICD-10-CM

## 2020-01-23 DIAGNOSIS — I2510 Atherosclerotic heart disease of native coronary artery without angina pectoris: Secondary | ICD-10-CM

## 2020-01-23 DIAGNOSIS — L900 Lichen sclerosus et atrophicus: Secondary | ICD-10-CM

## 2020-01-23 DIAGNOSIS — U071 COVID-19: Secondary | ICD-10-CM

## 2020-01-30 ENCOUNTER — Encounter: Admit: 2020-01-30 | Discharge: 2020-01-30

## 2020-01-31 ENCOUNTER — Encounter: Admit: 2020-01-31 | Discharge: 2020-01-31

## 2020-01-31 MED FILL — REPATHA SYRINGE 140 MG/ML SC SYRG: 140 mg/mL | SUBCUTANEOUS | 28 days supply | Qty: 2 | Status: CN

## 2020-02-03 ENCOUNTER — Encounter: Admit: 2020-02-03 | Discharge: 2020-02-03

## 2020-02-03 MED FILL — REPATHA SYRINGE 140 MG/ML SC SYRG: 140 mg/mL | SUBCUTANEOUS | 28 days supply | Qty: 2 | Fill #1 | Status: AC

## 2020-02-05 ENCOUNTER — Encounter: Admit: 2020-02-05 | Discharge: 2020-02-05

## 2020-02-06 ENCOUNTER — Encounter: Admit: 2020-02-06 | Discharge: 2020-02-06

## 2020-02-11 ENCOUNTER — Encounter: Admit: 2020-02-11 | Discharge: 2020-02-11

## 2020-02-11 ENCOUNTER — Ambulatory Visit: Admit: 2020-02-11 | Discharge: 2020-02-12 | Payer: Private Health Insurance - Indemnity

## 2020-02-11 DIAGNOSIS — R32 Unspecified urinary incontinence: Secondary | ICD-10-CM

## 2020-02-11 DIAGNOSIS — Z0389 Encounter for observation for other suspected diseases and conditions ruled out: Secondary | ICD-10-CM

## 2020-02-11 DIAGNOSIS — Z9289 Personal history of other medical treatment: Secondary | ICD-10-CM

## 2020-02-11 DIAGNOSIS — N3941 Urge incontinence: Secondary | ICD-10-CM

## 2020-02-11 DIAGNOSIS — N904 Leukoplakia of vulva: Secondary | ICD-10-CM

## 2020-02-11 DIAGNOSIS — Z9221 Personal history of antineoplastic chemotherapy: Secondary | ICD-10-CM

## 2020-02-11 DIAGNOSIS — D72819 Decreased white blood cell count, unspecified: Secondary | ICD-10-CM

## 2020-02-11 DIAGNOSIS — N390 Urinary tract infection, site not specified: Secondary | ICD-10-CM

## 2020-02-11 DIAGNOSIS — R351 Nocturia: Secondary | ICD-10-CM

## 2020-02-11 DIAGNOSIS — C50919 Malignant neoplasm of unspecified site of unspecified female breast: Secondary | ICD-10-CM

## 2020-02-11 DIAGNOSIS — D693 Immune thrombocytopenic purpura: Secondary | ICD-10-CM

## 2020-02-11 DIAGNOSIS — E785 Hyperlipidemia, unspecified: Principal | ICD-10-CM

## 2020-02-11 DIAGNOSIS — R1310 Dysphagia, unspecified: Secondary | ICD-10-CM

## 2020-02-11 DIAGNOSIS — K449 Diaphragmatic hernia without obstruction or gangrene: Secondary | ICD-10-CM

## 2020-02-11 DIAGNOSIS — L900 Lichen sclerosus et atrophicus: Secondary | ICD-10-CM

## 2020-02-11 DIAGNOSIS — U071 COVID-19: Secondary | ICD-10-CM

## 2020-02-11 DIAGNOSIS — I10 Essential (primary) hypertension: Secondary | ICD-10-CM

## 2020-02-11 DIAGNOSIS — Z923 Personal history of irradiation: Secondary | ICD-10-CM

## 2020-02-11 DIAGNOSIS — N952 Postmenopausal atrophic vaginitis: Secondary | ICD-10-CM

## 2020-02-11 DIAGNOSIS — K219 Gastro-esophageal reflux disease without esophagitis: Secondary | ICD-10-CM

## 2020-02-11 DIAGNOSIS — I2510 Atherosclerotic heart disease of native coronary artery without angina pectoris: Secondary | ICD-10-CM

## 2020-02-11 LAB — URINALYSIS, MICROSCOPIC

## 2020-02-11 NOTE — Patient Instructions
Recommend urine cultures with signs and symptoms of infection:     Please contact office or MyChart Message, with current signs and symptoms and to notify that urine culture was submitted.    If submitted outside of  health system, please forward results    Gershon Cull, PA-C  Department of Urology  [facility]  [address]  [place], [place] [address]  Phone [phone]    fax [phone]          [place]  [facility]  [address] 1st floor, directly to the left of the Information Desk  [place], [place] [address]  [phone]    Hours  6:30 a.m.-6 p.m. Monday  7 a.m.-6 p.m. Tuesday-Friday  7 a.m.-noon Saturday    [place], [place]  [address] [place], [place] [address]  [phone]    Hours  8 a.m.-4:30 p.m. Monday-Friday    [facility]  [facility]  [address]  [place], [place] [address]  [phone]    Hours  8 a.m.-5 p.m. Monday-Friday    [facility]  [facility]  [facility]  [address]  [place], [place] [address]  [phone]    Hours  8 a.m.-4:30 p.m. Monday-Friday    All pathology locations are closed on major holidays (excluding Veterans Day).      Fingertip Application Method  For Cream    ESTROGEN CREAM APPLICATION:    Wash hands with soap and water. Please draw a line with the estrogen cream from the tip of your index finger to the first line on your finger. Insert your finger into the vagina up until it is comfortable and swirl around. While removing your finger, please spread up and down on external vaginal/ urethral area. This should be used THREE NIGHTS PER WEEK PRIOR TO BEDTIME.  This will only help with improving the quality of vaginal tissue, pain with intercourse and urinary tract prevention if used regularly. It is not absorbed systemically and is not associated with clotting, cancer, stroke, or heart attacks.                1. Wash your hands with soap and water and dry thoroughly.    2. Squeeze tube to express out 1 gram of cream (about enough to cover the tip of your index finger, from the last joint to the finger tip.  (see Figure 1). Please draw a line with the estrogen cream from the tip of your index finger to the first line on your finger.           Figure 1        This instruction sheet is provided to help review the method for applying cream.  Please note that this information is not intended to replace your practitioner?s instructions: always follow your doctor?s specific directions regarding the use of any prescription medications    3. Locate the vaginal opening (see Figure 2).  Immediately above the vaginal opening is the urethra (a small opening where urine is eliminated from you body).  The urethra may not be as easily identified as the vagina because the opening is much smaller, however, [place] the diagram to determine its approximate location.    4. Insert your finger into the vagina up until it is comfortable and swirl around. While removing your finger, please spread up and down on external  vaginal/ urethral area. This should be used THREE NIGHTS PER WEEK PRIOR TO BEDTIME.      5. It is not recommended to use the applicator that comes with the cream. Use only the small finger tip amount as noted above.   6.                       Figure 2    To make an appointment call   Phone [phone]      If you need a medication refill have call Nurse, and leave a message, Phone [phone]      If you need to fax records for review, please fax to: [phone]    Gershon Cull, PA-C  Department of Urology  [facility]  [address]  [place], [place] [address]

## 2020-02-12 ENCOUNTER — Encounter: Admit: 2020-02-12 | Discharge: 2020-02-12

## 2020-02-12 DIAGNOSIS — N390 Urinary tract infection, site not specified: Principal | ICD-10-CM

## 2020-02-13 ENCOUNTER — Encounter: Admit: 2020-02-13 | Discharge: 2020-02-13

## 2020-02-13 DIAGNOSIS — N390 Urinary tract infection, site not specified: Secondary | ICD-10-CM

## 2020-02-13 MED ORDER — CEFPODOXIME 200 MG PO TAB
200 mg | ORAL_TABLET | Freq: Two times a day (BID) | ORAL | 0 refills | 7.00000 days | Status: AC
Start: 2020-02-13 — End: ?

## 2020-02-13 NOTE — Telephone Encounter
-----   Message from Marshia Ly, PA-C sent at 02/13/2020  3:47 PM CST -----  Luther Parody,    Please check that patient has reviewed MyChart message and if not please contact patient to review plan    Thank you ~ [facility]    Future Appointments  03/16/2020  2:30 PM    McIntosh-James, Ginger E,# [email or web address]          CVM Exam  04/13/2020  3:15 PM    MAMMO - IC ROOM 1          [id number]             ICC Radiolog  04/13/2020  3:30 PM    Guy Begin, P# [id number]             [place] Exam  04/13/2020  3:30 PM    BIS IC1 - BIOIMPEDENCE SP# [id number]             [place] Exam  04/23/2020  11:00 AM   Alwyn Pea, DO         [id number]        [place] Exam  08/11/2020  11:00 AM   Marshia Ly, PA-C       [facility]              Urology   10/27/2020  11:00 AM   Reola Mosher, MD      [id number]        [facility]

## 2020-02-13 NOTE — Telephone Encounter
Called pt to review results of recent urine culture - positive for infection. Reviewed abx and pharmacy. No further questions.

## 2020-02-18 ENCOUNTER — Encounter: Admit: 2020-02-18 | Discharge: 2020-02-18

## 2020-02-18 DIAGNOSIS — N390 Urinary tract infection, site not specified: Secondary | ICD-10-CM

## 2020-02-18 MED ORDER — CEFPODOXIME 200 MG PO TAB
200 mg | ORAL_TABLET | Freq: Two times a day (BID) | ORAL | 0 refills | 7.00000 days | Status: AC
Start: 2020-02-18 — End: ?

## 2020-02-18 NOTE — Telephone Encounter
Returned call to pt regarding abx prescribed recently by [employer] PA-C. She stated she was notified today that her pharmacy does not have the mediation. She is requesting I send it to [facility]. Rx forwarded.

## 2020-03-03 ENCOUNTER — Encounter: Admit: 2020-03-03 | Discharge: 2020-03-03

## 2020-03-03 MED FILL — REPATHA SYRINGE 140 MG/ML SC SYRG: 140 mg/mL | SUBCUTANEOUS | 28 days supply | Qty: 2 | Fill #2 | Status: AC

## 2020-03-04 ENCOUNTER — Encounter: Admit: 2020-03-04 | Discharge: 2020-03-04

## 2020-03-04 DIAGNOSIS — I8393 Asymptomatic varicose veins of bilateral lower extremities: Secondary | ICD-10-CM

## 2020-03-06 ENCOUNTER — Encounter: Admit: 2020-03-06 | Discharge: 2020-03-06

## 2020-03-16 ENCOUNTER — Encounter: Admit: 2020-03-16 | Discharge: 2020-03-16

## 2020-03-16 DIAGNOSIS — H547 Unspecified visual loss: Secondary | ICD-10-CM

## 2020-03-16 DIAGNOSIS — N390 Urinary tract infection, site not specified: Secondary | ICD-10-CM

## 2020-03-16 DIAGNOSIS — E7801 Familial hypercholesterolemia: Secondary | ICD-10-CM

## 2020-03-16 DIAGNOSIS — I10 Essential (primary) hypertension: Secondary | ICD-10-CM

## 2020-03-16 DIAGNOSIS — E785 Hyperlipidemia, unspecified: Secondary | ICD-10-CM

## 2020-03-16 DIAGNOSIS — L900 Lichen sclerosus et atrophicus: Secondary | ICD-10-CM

## 2020-03-16 DIAGNOSIS — C50919 Malignant neoplasm of unspecified site of unspecified female breast: Secondary | ICD-10-CM

## 2020-03-16 DIAGNOSIS — R32 Unspecified urinary incontinence: Secondary | ICD-10-CM

## 2020-03-16 DIAGNOSIS — I2510 Atherosclerotic heart disease of native coronary artery without angina pectoris: Secondary | ICD-10-CM

## 2020-03-16 DIAGNOSIS — Z923 Personal history of irradiation: Secondary | ICD-10-CM

## 2020-03-16 DIAGNOSIS — Z9289 Personal history of other medical treatment: Secondary | ICD-10-CM

## 2020-03-16 DIAGNOSIS — D72819 Decreased white blood cell count, unspecified: Secondary | ICD-10-CM

## 2020-03-16 DIAGNOSIS — U071 COVID-19: Secondary | ICD-10-CM

## 2020-03-16 DIAGNOSIS — D693 Immune thrombocytopenic purpura: Secondary | ICD-10-CM

## 2020-03-16 DIAGNOSIS — R931 Abnormal findings on diagnostic imaging of heart and coronary circulation: Secondary | ICD-10-CM

## 2020-03-16 DIAGNOSIS — R1310 Dysphagia, unspecified: Secondary | ICD-10-CM

## 2020-03-16 DIAGNOSIS — K219 Gastro-esophageal reflux disease without esophagitis: Secondary | ICD-10-CM

## 2020-03-16 DIAGNOSIS — Z9221 Personal history of antineoplastic chemotherapy: Secondary | ICD-10-CM

## 2020-03-16 DIAGNOSIS — K449 Diaphragmatic hernia without obstruction or gangrene: Secondary | ICD-10-CM

## 2020-03-16 DIAGNOSIS — N904 Leukoplakia of vulva: Secondary | ICD-10-CM

## 2020-03-16 DIAGNOSIS — C50311 Malignant neoplasm of lower-inner quadrant of right female breast: Secondary | ICD-10-CM

## 2020-03-16 DIAGNOSIS — Z0389 Encounter for observation for other suspected diseases and conditions ruled out: Secondary | ICD-10-CM

## 2020-03-16 MED ORDER — REPATHA SYRINGE 140 MG/ML SC SYRG
140 mg | SUBCUTANEOUS | 11 refills | 28.00000 days | Status: AC
Start: 2020-03-16 — End: ?

## 2020-03-16 MED ORDER — ROSUVASTATIN 5 MG PO TAB
5 mg | ORAL_TABLET | ORAL | 3 refills | 90.00000 days | Status: AC
Start: 2020-03-16 — End: ?

## 2020-03-16 NOTE — Progress Notes
Date of Service: 03/16/2020    Teresa Trevino is a 65 y.o. female.       HPI     Teresa Trevino was seen in our office today for routine follow-up.  She is a 65 year old female followed in office by Dr. Bobette Mo.  The patient has a medical history significant for minimal  coronary artery disease, hypertension, hyperlipidemia, familial hypercholesterolemia, risk factors for CAD, breast cancer, recurrent UTIs on Cipro empirically routinely and followed by urology, history of bladder injury in 2008 with hysterectomy, s/p Midurethral sling using Align R Retropubic Urethral Support System, lichen sclerosis using clobetasol and vaginal estrogen .  She initially presented with an abnormal CT calcium score and a CT coronary angiogram revealed moderate plaque in the LAD and mild disease in her left circumflex.  We therefore prescribed statin to control her cholesterol.  Her cholesterol is not been well controlled with statins and she has an intolerance to high-dose statins.  She therefore is working on getting PCSK9 inhibitors.  She is also followed for carcinoma of the breast treated with radiation therapy and Taxotere and carboplatin which do not typically cause cardiotoxicity.  An echocardiogram after therapy demonstrated normal left-ventricular function and we do not believe we need to be concerned about cardiotoxicity.      Her cholesterol control was very good on statins, however she did not tolerate these drugs due to myalgias and back pain.  Her family doctor arrange for her to have a PCSK9 inhibitor, however she ultimately decided she had had enough injections after her cancer treatment and went back on rosuvastatin 5 mg 3 times a week since December 2020, which she continues on at this time.  She is tolerating this dose.   Her cholesterol, however is not at target.  She has minimal coronary artery disease and risk factors and therefore needs to have her LDL below 70.  In November 2020 her LDL cholesterol was 119 with a  HDL of 40, triglycerides 85 and total cholesterol 176.  Her non-HDL cholesterol was also markedly elevated at 136.  She was prescribed Repatha in December 2020  but had to wait for  notification on patient assistance after completing the appropriate paperwork.    In June 2021 we did ask her to go ahead and get started on the Repatha 140 mg every 14 days in addition to rosuvastatin 5 mg 3 times weekly.  She was asked to get an updated lipid panel checked after 6 doses of Repatha.    She was last seen in our office on 11/01/2019.    She does have recurrent UTIs and is followed by urology at [facility].      She is also followed by oncology at [facility], Dr. Olin Pia, for breast cancer and thrombocytopenia.    Today the patient reports recently she has had some difficulty with ocular headaches which she had in the past infrequently usually once a year or so but within the last week she has had 2.  She describes these as seeing squiggly or flashing lights in front of her eyes, proceeded by inability to see the first letter of a word when she starts to read.  He has follow-up with her ophthalmologist in [place] on 04/06/2020.  Otherwise she denies cardiovascular symptoms.  She denies chest pain, exertional chest pain, shortness of breath, PND, orthopnea, lower extremity edema, palpitations, racing heartbeats, dizziness, lightheadedness, near syncope and syncope.  She does note some leg cramps and heaviness from  time to time in both legs which can occur day or night and some numbness on occasion her right second toe.  He is not describing claudication symptoms.  He has follow-up scheduled with vascular [place] on 04/13/2020.  She sees Dr. Freida Busman, her oncologist in March alternating every 6 months with Dr. Isabella Stalling, radiation oncology whom she will see in September.  Me today that her mother had an abdominal aortic aneurysm and so she did have an abdominal ultrasound performed in December 2021.  I have received and reviewed the outside report from abdominal ultrasound performed on 02/14/2020 which showed no evidence of abdominal aortic aneurysm.  She and her husband have been having a home built in [place] which will be ready to move in in early March.  She does think that some of the stress of getting ready to move has triggered her ocular headaches.  She remains on Repatha and rosuvastatin 5 mg 3 times weekly.  She is unable to tolerate higher doses of statin but is doing okay on current dose without complaint of myalgias or arthralgias.  She does request to reduce the dose of Crestor due to cost.  She had problems with myalgias when she was taking generic rosuvastatin but has not had any problems on brand Crestor.    Her latest lipid panel from 02/04/2020 shows a total cholesterol 103, triglycerides 76, HDL 41, LDL 47.  LDL is at target.  Liver function test within normal range.    Assessment and Plan:  1.  Familial hypercholesterolemia.  Treated. LDL at goal on current therapy with Repatha 140 mg every 2 weeks and rosuvastatin 5 mg 3 times weekly. On  rosuvastatin 5 mg 3 times weekly alon her LDL cholesterol was improved but not adequate  therefore a PCSK9 inhibitor was added which is significantly reduced her LDL cholesterol.   Due to the cost of brand name Crestor she would like to either come off statin or reduce the dose.  I think it is reasonable with her LDL of 47 to cut back on the dose to 5 mg once weekly.  She is agreeable to this.  2.  Statin intolerance.  She has developed severe myalgias with  atorvastatin and higher dose rosuvastatin.   She did have an excellent response to high-dose atorvastatin but did not tolerate.  She is tolerating low-dose dose Crestor and Repatha without myalgias or arthralgias.    3.  Coronary artery disease.  She has mild disease on CT coronary angiogram involving LAD and mild plaque in circumflex.  Normal LV systolic function, EF 60% by echo performed on 07/17/2017.  She is asymptomatic, denies chest pain and angina symptoms.  4.  Breast carcinoma, post chemotherapy and radiation therapy to right chest.  No cardiotoxicity from drugs.  Follows with with breast surgery, oncology and radiation oncology at [facility].  5.  Elevated BMI 31.6.  6. Thrombocytopenia.  7. Recurrent UTIs. Follows with urology at [facility].   8.  Ocular migraines.  Patient is reporting some symptoms of amaurosis recently preceding visual disturbance as described above.  She has follow-up with her ophthalmologist in [place], [place] on 04/06/2020.  We will obtain a carotid ultrasound.  9.  Varicose veins.  Patient does have leg cramping and heaviness and she may have some venous insufficiency.  10.  Positive family history of abdominal aortic aneurysm.  Abdominal ultrasound performed on 02/14/2020 negative for AAA.     -Decrease rosuvastatin to 5 mg to once weekly.  ?Continue current dose  of Repatha.  -Follow-up lipid profile, AST and AST in 6 months.  -Carotid ultrasound.  -Risk factors reduction.  ?Recommend weight reduction. Suggest Weight Watchers or Mediterranean diet.  Encouraged to lose 10 pounds over the next 3 months.  -Increase exercise.  Advised to get back on the walking program and get in a minimum of 30 minutes of walking/moderate aerobic exercise 5 days a week.  Increase duration of exercise to 45 to 60 minutes most days of the week to help with weight loss.  -Suggested use of compression socks, increase walking and avoid avoid prolonged periods of sitting or standing.   -Follow-up with ophthalmology as scheduled.     Follow-up: Follow-up with Malyia Moro Valli Glance, nurse practitioner in 6 months in our [place].  Plan for follow-up with Dr. Doristine Counter in 1 year.  The patient is encouraged to contact our office if she has problems prior to next visit.     I have educated the patient on the plan of care today. Patient verbally expressed understanding and agreement with the plan. Instructions are outlined in the after visit summary document.      Thank you for the opportunity to participate in this pleasant patient's care. Please don't hesitate to contact me with any concerns or questions.     Total time spent on today's office visit was 55 minutes. This includes face-to-face in person visit with patient as well as nonface-to-face time including review of the EMR, outside records, labs, radiologic studies, echocardiogram & other cardiovascular studies, formulation of treatment plan, after visit summary, future disposition, personal discussions with patient. Patient education/counseling greater than 30 minutes including medication instructions, medication interactions, treatment options/risk/benefits, blood pressure monitoring, blood pressure goals, diet/sodium restriction, exercise, weight reduction and follow-up plan and on documentation.  Reviewed outside records at the time of patient's visit today.                                          DRB  Vitals:    03/16/20 1417   BP: 112/76   BP Source: Arm, Left Upper   Patient Position: Sitting   Pulse: 91   SpO2: 98%   Weight: 94.3 kg (208 lb)   Height: 1.727 m (5' 8)   PainSc: Zero     Body mass index is 31.63 kg/m?Marland Kitchen     Past Medical History  Patient Active Problem List    Diagnosis Date Noted   ? Neuropathy 10/24/2019   ? Pelvic pain 08/08/2019   ? Gastroesophageal reflux disease 05/11/2017   ? Essential hypertension 03/02/2017   ? Thrombocytopenia (HCC) 02/16/2017   ? Coronary artery disease involving native coronary artery of native heart without angina pectoris 01/30/2017     08/2007 Exercise sestamibi MPI [place].  5.58min o9n TM, no CP. EF 59%, no ischemia  03/17  Ex Echo at [facility] - no ischemia, 6 min on TM, TDS, no CP, no ECG changes, EF normal  06/17  Coronary Artery AJ-130 Score: Total 234.  LAD 206, RCA 28  08/17 CT Cor angiogram at [place] EF 56%, chambers and valves normal, LAD-moderate concentric flat plaque, FFR 0.86; LCx mild plaque, RCA normal.  Thoracic aorta normal.     ? 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 Whitt is a caucasian female who presented to the [facility] on 01/25/2017 at age 33 for evaluation of right breast cancer. Ms. Wanek had no complaints prior to her screening mammogram. A new right breast asymmetry was present on screening mammogram. There are was suspicious on diagnostic imaging and biopsy was recommended. Right breast sono-guided biopsy 01/19/17 ([place]) revealed grade 3 invasive ductal carcinoma. Ms. Aure underwent right RSL lumpectomy/SLNB on 07/06/17. She finished radiation with Dr. Ardeth Perfect on 10/05/17.    PATHOLOGY:  Tumor:  Size/Extent of Tumor Bed: 1.2 x 0.6 cm   Size/Extent of Residual Invasive Tumor: No invasive tumor identified   Overall Residual Cellularity of the Tumor Bed: <1%   1.5 mm DCIS present  Margins Free From Tumor:  Yes  ER:  negative   PR:  negative    Her 2:  negative  Grade:  3  Lymph Nodes:  0/4  LVSI:  no  Extranodal extension:  no      BREAST IMAGING:  Mammogram:    -- Bilateral screening mammogram 12/20/16 ([place]) revealed scattered fibroglandular densities. There was a focal asymmetry present within the central posterior right breast. Additional diagnostic images and ultrasound were recommended. No abnormalities were present on the left breast.  -- Right diagnostic mammogram 01/04/17 ([place]) revealed an approximately 1.2 cm x 0.6 cm x 0.5 cm low-density mass at 4:00 with associated calcifications. This was considered suspicious and further evaluation with ultrasound will be performed.    Ultrasound:    -- Targeted right breast ultrasound 01/04/17 ([place]) revealed at 4:00 there was an irregular hypoechoic mass measuring approximately 6 mm x 9 mm x 8 mm with associated microcalcifications. This was thought to likely correlate with the mammographic abnormality and was considered somewhat suspicious. Ultrasound-guided biopsy was recommended. If the clip marker form sono guided biopsy of this mass did not correlate to the mammographic finding, additional stereotactic biopsy might be required. These findings recommendations were discussed in detail with the patient at the time of today's procedure.     REPRODUCTIVE HEALTH:  Age at first Menarche: Unknown   Age at Menopause:  Hysterectomy/BSO at 2, HRT cream for several months    PROCEDURE: Right RSL lumpectomy/SLNB, 07/06/17  PERTINENT PMH:  HTN, chronic leukopenia  FAMILY HISTORY:  Maternal Grandmother- Breast cancer dx age late 48s  PHYSICAL EXAM on PRESENTATION: Right - No palpable breast masses. No skin, nipple, or areolar change. Left - No palpable breast masses. No skin, nipple, or areolar change. No supraclavicular or axillary adenopathy.   MEDICAL ONCOLOGY:  Dr. Olin Pia NEOADJUVANT THERAPY: Taxotere/carbo completed 06/01/17  REFERRED BY:  Efraim Kaufmann [place] PA       ? Nocturia 01/2017   ? Post-void dribbling 01/2017   ? Lichen sclerosus et atrophicus of the vulva 09/22/2015   ? Agatston coronary artery calcium score between 200 and 399 08/21/2015   ? Hypercholesterolemia 08/20/2015   ? Vulvar lesion 03/12/2015   ? Recurrent UTI 09/11/2014   ? Dyspareunia, female 05/13/2013   ? Vaginal atrophy 05/13/2013   ? Urge incontinence of urine 05/13/2013   ? Mixed incontinence urge and stress (female)(female) 10/19/2011         Review of Systems   Constitutional: Negative.   HENT: Negative.    Eyes: Positive for visual disturbance.   Cardiovascular: Negative.    Respiratory: Negative.    Endocrine: Negative.    Hematologic/Lymphatic: Negative.  Skin: Negative.    Musculoskeletal: Positive for joint pain.   Gastrointestinal: Negative.    Genitourinary: Negative.    Neurological: Negative.    Psychiatric/Behavioral: Negative.    Allergic/Immunologic: Negative.        Physical Exam  Vital signs were reviewed.   General Appearance:appears well nourished, elevated BMI, appears relaxed, in no acute distress,wearing a face mask  Skin: warm, moist, intact, no rash or lesions, no xanthomas  HEENT: unremarkable, pupils equal and round, no scleral icterus, conjunctivae and lids normal  Lips & Mouth: no pallor or cyanosis  Neck Veins: JVP normal,.  neck veins are flat, neck veins are not distended   Carotid Arteries: normal carotid upstroke bilaterally, no bruits bilaterally  Chest Inspection: chest is normal in appearance  Auscultation/Percussion/Effort: lungs clear to auscultation, no rales, rhonchi, or wheezing, respirations even and unlabored, no respiratory distress  Cardiac Rhythm: regular rhythm and normal rate   Cardiac Auscultation: normal S1 & S2, no S3 or S4, no rub   Murmurs: no cardiac murmurs   Lower extremities: no edema bilaterally, 2+ symmetric distal pulses   Abdominal Exam: soft, non-tender,non-distended, no obvious masses, bowel sounds normal, no guarding  Liver & Spleen: no organomegaly   Neurologic Exam: grossly intact, alert, moves all extremities equally   Orientation: oriented to time, person and place, clear historian  Gait: normal, steady, walks without assistance  Language & Memory: speech clear, patient responsive, seems to comprehend information          Cardiovascular Health Factors  Vitals BP Readings from Last 3 Encounters:   03/16/20 112/76   02/11/20 120/80   01/22/20 110/63     Wt Readings from Last 3 Encounters:   03/16/20 94.3 kg (208 lb)   02/11/20 95.5 kg (210 lb 9.6 oz)   01/22/20 95.7 kg (211 lb)     BMI Readings from Last 3 Encounters:   03/16/20 31.63 kg/m?   02/11/20 32.02 kg/m?   01/22/20 32.08 kg/m?      Smoking Social History     Tobacco Use   Smoking Status Never Smoker   Smokeless Tobacco Never Used      Lipid Profile Cholesterol   Date Value Ref Range Status   02/04/2020 103  Final     HDL   Date Value Ref Range Status   02/04/2020 41  Final     LDL   Date Value Ref Range Status 02/04/2020 47  Final     Triglycerides   Date Value Ref Range Status   02/04/2020 76  Final      Blood Sugar No results found for: HGBA1C  Glucose   Date Value Ref Range Status   02/04/2020 95  Final   06/18/2019 103  Final   07/10/2018 98  Final          Problems Addressed Today  Encounter Diagnoses   Name Primary?   ? Familial hypercholesterolemia Yes   ? Agatston coronary artery calcium score between 200 and 399    ? Coronary artery disease involving native coronary artery of native heart without angina pectoris    ? Essential hypertension    ? Malignant neoplasm of lower-inner quadrant of right breast of female, estrogen receptor negative (HCC)    ? Amaurosis                      Current Medications (including today's revisions)  ? cholecalciferol (vitamin D3) (OPTIMAL D3) 50,000 units capsule TAKE ONE CAPSULE  BY MOUTH EVERY WEEK   ? clobetasoL (TEMOVATE) 0.05 % topical ointment Apply BID x 2-4 weeks and taper to TIW prn   ? estradioL (ESTRACE) 0.01 % (0.1 mg/g) vaginal cream Apply 0.5 gram to the urethral opening at night for 2 weeks then twice weekly   ? evolocumab (REPATHA SYRINGE) 140 mg/mL injectable SYRINGE Inject 1 mL under the skin every 14 days.   ? hyoscyamine sulfate (LEVSIN/SL) 0.125 mg sublingual tablet Place one tablet under tongue every 4 hours as needed for Other... (Bladder spasms, urgency, pelvic pain). Max 1.5 mg/ day   ? losartan (COZAAR) 50 mg tablet Take 50 mg by mouth daily.   ? metroNIDAZOLE (METROGEL) 0.75 % topical gel Apply  topically to affected area every 48 hours. (Patient taking differently: Apply  topically to affected area every 48 hours. As needed)   ? omeprazole DR (PRILOSEC) 20 mg capsule TAKE 1 CAPSULE BY MOUTH EVERY DAY BEFORE BREAKFAST   ? phenazopyridine (PYRIDIUM) 200 mg tablet Take one tablet by mouth three times daily as needed for Pain. Max 3 days.   ? rosuvastatin (CRESTOR) 5 mg tablet Take one tablet by mouth every 7 days.

## 2020-03-17 ENCOUNTER — Encounter: Admit: 2020-03-17 | Discharge: 2020-03-17

## 2020-03-18 ENCOUNTER — Encounter: Admit: 2020-03-18 | Discharge: 2020-03-18

## 2020-03-23 ENCOUNTER — Encounter: Admit: 2020-03-23 | Discharge: 2020-03-23

## 2020-03-23 DIAGNOSIS — K219 Gastro-esophageal reflux disease without esophagitis: Secondary | ICD-10-CM

## 2020-03-23 MED ORDER — OMEPRAZOLE 20 MG PO CPDR
20 mg | ORAL_CAPSULE | Freq: Every day | ORAL | 5 refills | Status: AC
Start: 2020-03-23 — End: ?

## 2020-03-23 NOTE — Telephone Encounter
Pt called stated she is now on Medicare & needs Rx for Omeprazole to be sent to [facility] [place]. Pt last seen 12/11/20 by L. Mayer Masker. [facility] to [facility] for pt.

## 2020-04-03 MED ORDER — REPATHA SYRINGE 140 MG/ML SC SYRG
140 mg | SUBCUTANEOUS | 11 refills | Status: CN
Start: 2020-04-03 — End: ?

## 2020-04-03 NOTE — Progress Notes
The Prior Authorization RENEWAL for Repatha has been submitted for De Burrs Chaudhary via Cover My Meds.  Will continue to follow.    [facility] Patient Advocate  [phone]

## 2020-04-06 NOTE — Progress Notes
The Prior Authorization for repatha was approved for Teresa Trevino from 03/04/2020 to 04/03/2021.  The copay is $5.  The PA authorization number is [id number].      Teresa Trevino has stated this copay is affordable.  The specialty pharmacy will pursue additional copay assistance as necessary.  The specialty pharmacy will reach out to the oral chemotherapy pharmacist if the copay becomes unaffordable.    The medication will be delivered to patient's prescription address per the patient's request.    [place] Patient Advocate  [phone]

## 2020-04-13 ENCOUNTER — Ambulatory Visit: Admit: 2020-04-13 | Discharge: 2020-04-13 | Payer: MEDICARE

## 2020-04-13 ENCOUNTER — Encounter: Admit: 2020-04-13 | Discharge: 2020-04-13 | Payer: MEDICARE

## 2020-04-13 DIAGNOSIS — C50919 Malignant neoplasm of unspecified site of unspecified female breast: Secondary | ICD-10-CM

## 2020-04-13 DIAGNOSIS — L900 Lichen sclerosus et atrophicus: Secondary | ICD-10-CM

## 2020-04-13 DIAGNOSIS — U071 COVID-19: Secondary | ICD-10-CM

## 2020-04-13 DIAGNOSIS — D72819 Decreased white blood cell count, unspecified: Secondary | ICD-10-CM

## 2020-04-13 DIAGNOSIS — C50311 Malignant neoplasm of lower-inner quadrant of right female breast: Secondary | ICD-10-CM

## 2020-04-13 DIAGNOSIS — Z1231 Encounter for screening mammogram for malignant neoplasm of breast: Secondary | ICD-10-CM

## 2020-04-13 DIAGNOSIS — Z9289 Personal history of other medical treatment: Secondary | ICD-10-CM

## 2020-04-13 DIAGNOSIS — Z9189 Other specified personal risk factors, not elsewhere classified: Secondary | ICD-10-CM

## 2020-04-13 DIAGNOSIS — Z0389 Encounter for observation for other suspected diseases and conditions ruled out: Secondary | ICD-10-CM

## 2020-04-13 DIAGNOSIS — K449 Diaphragmatic hernia without obstruction or gangrene: Secondary | ICD-10-CM

## 2020-04-13 DIAGNOSIS — N904 Leukoplakia of vulva: Secondary | ICD-10-CM

## 2020-04-13 DIAGNOSIS — D693 Immune thrombocytopenic purpura: Secondary | ICD-10-CM

## 2020-04-13 DIAGNOSIS — Z853 Personal history of malignant neoplasm of breast: Secondary | ICD-10-CM

## 2020-04-13 DIAGNOSIS — I2510 Atherosclerotic heart disease of native coronary artery without angina pectoris: Secondary | ICD-10-CM

## 2020-04-13 DIAGNOSIS — Z9889 Other specified postprocedural states: Secondary | ICD-10-CM

## 2020-04-13 DIAGNOSIS — K219 Gastro-esophageal reflux disease without esophagitis: Secondary | ICD-10-CM

## 2020-04-13 DIAGNOSIS — Z9221 Personal history of antineoplastic chemotherapy: Secondary | ICD-10-CM

## 2020-04-13 DIAGNOSIS — R32 Unspecified urinary incontinence: Secondary | ICD-10-CM

## 2020-04-13 DIAGNOSIS — I10 Essential (primary) hypertension: Secondary | ICD-10-CM

## 2020-04-13 DIAGNOSIS — Z923 Personal history of irradiation: Secondary | ICD-10-CM

## 2020-04-13 DIAGNOSIS — Z08 Encounter for follow-up examination after completed treatment for malignant neoplasm: Secondary | ICD-10-CM

## 2020-04-13 DIAGNOSIS — E785 Hyperlipidemia, unspecified: Secondary | ICD-10-CM

## 2020-04-13 DIAGNOSIS — I8393 Asymptomatic varicose veins of bilateral lower extremities: Secondary | ICD-10-CM

## 2020-04-13 DIAGNOSIS — R1310 Dysphagia, unspecified: Secondary | ICD-10-CM

## 2020-04-13 DIAGNOSIS — N390 Urinary tract infection, site not specified: Secondary | ICD-10-CM

## 2020-04-13 NOTE — Progress Notes
Bioimpedance Spectroscopy performed.  Advised patient that additional information will be sent via Mychart (preferred) or phone if indicated by the lymphedema nurse within 24 hours.

## 2020-04-13 NOTE — Progress Notes
Name: Teresa Trevino          MRN: [id number]      DOB: 09/05/1955      AGE: 65 y.o.   DATE OF SERVICE: 04/13/2020           Reason for Visit:  Heme/Onc Care      Teresa Trevino is a 65 y.o. female.     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     HISTORY:  Teresa Trevino is a caucasian female who presented to the [facility] on 01/25/2017 at age 56 for evaluation of right breast cancer. Teresa Trevino had no complaints prior to her screening mammogram. A new right breast asymmetry was present on screening mammogram. There are was suspicious on diagnostic imaging and biopsy was recommended. Right breast sono-guided biopsy 01/19/17 ([place]) revealed grade 3 invasive ductal carcinoma. Teresa Trevino underwent right RSL lumpectomy/SLNB on 07/06/17. She finished radiation with Dr. Ardeth Perfect on 10/05/17.    PATHOLOGY:  Tumor:  Size/Extent of Tumor Bed: 1.2 x 0.6 cm   Size/Extent of Residual Invasive Tumor: No invasive tumor identified   Overall Residual Cellularity of the Tumor Bed: <1%   1.5 mm DCIS present  Margins Free From Tumor:  Yes  ER:  negative   PR:  negative    Her 2:  negative  Grade:  3  Lymph Nodes:  0/4  LVSI:  no  Extranodal extension:  no      BREAST IMAGING:  Mammogram:    -- Bilateral screening mammogram 12/20/16 ([place]) revealed scattered fibroglandular densities. There was a focal asymmetry present within the central posterior right breast. Additional diagnostic images and ultrasound were recommended. No abnormalities were present on the left breast.  -- Right diagnostic mammogram 01/04/17 ([place]) revealed an approximately 1.2 cm x 0.6 cm x 0.5 cm low-density mass at 4:00 with associated calcifications. This was considered suspicious and further evaluation with ultrasound will be performed.    Ultrasound:    -- Targeted right breast ultrasound 01/04/17 ([place]) revealed at 4:00 there was an irregular hypoechoic mass measuring approximately 6 mm x 9 mm x 8 mm with associated microcalcifications. This was thought to likely correlate with the mammographic abnormality and was considered somewhat suspicious. Ultrasound-guided biopsy was recommended. If the clip marker form sono guided biopsy of this mass did not correlate to the mammographic finding, additional stereotactic biopsy might be required. These findings recommendations were discussed in detail with the patient at the time of today's procedure.     REPRODUCTIVE HEALTH:  Age at first Menarche: Unknown   Age at Menopause:  Hysterectomy/BSO at 65, HRT cream for several months    PROCEDURE: Right RSL lumpectomy/SLNB, 07/06/17  PERTINENT PMH:  HTN, chronic leukopenia  FAMILY HISTORY:  Maternal Grandmother- Breast cancer dx age late 54s  PHYSICAL EXAM on PRESENTATION: Right - No palpable breast masses. No skin, nipple, or areolar change. Left - No palpable breast masses. No skin, nipple, or areolar change. No supraclavicular or axillary  adenopathy.   MEDICAL ONCOLOGY:  Dr. Olin Pia NEOADJUVANT THERAPY: Taxotere/carbo completed 06/01/17  REFERRED BY:  Efraim Kaufmann Huntington PA     Review of Systems    Constitutional: Negative for fever, chills, appetite change and fatigue.   HENT: Negative for hearing loss, congestion, rhinorrhea and tinnitus.    Eyes: Negative for pain, discharge and itching.   Respiratory: Negative for cough, chest tightness and shortness of breath.    Cardiovascular: Negative for chest pain and palpitations.   Gastrointestinal: Negative for abdominal distention, pain, nausea, vomiting, and diarrhea.   Genitourinary: Negative for frequency, vaginal bleeding, difficulty urinating and pelvic pain.   Musculoskeletal: Negative for myalgias, back pain, joint swelling and arthralgias.   Skin: Negative for rash.   Neurological: Negative for dizziness, weakness, light-headedness and headaches.   Hematological: Does not bruise/bleed easily.   Psychiatric/Behavioral: Negative for disturbed wake/sleep cycle. The patient is not nervous/anxious.    Allergies   Allergen Reactions   ? Bactrim [Sulfamethoxazole-Trimethoprim] NAUSEA AND VOMITING     Makes me feel ill   ? Erythromycin NAUSEA AND VOMITING   ? Macrobid [Nitrofurantoin Monohyd/M-Cryst] NAUSEA AND VOMITING   ? Sulfa (Sulfonamide Antibiotics) NAUSEA AND VOMITING       Medical History:   Diagnosis Date   ? Breast cancer (HCC) 01/2017    right IDC   ? Chronic leukopenia    ? Coronary artery disease    ? COVID-19 10/2018   ? Difficulty swallowing     past issue during chemotherapy-no longer an issue per pt   ? Dyslipidemia    ? GERD (gastroesophageal reflux disease)    ? Hiatal hernia    ? History of blood transfusion     during chemo therapy   ? History of chemotherapy    ? History of EKG 01/2017   ? History of radiation therapy    ? Hyperlipemia 08/20/2015   ? Hypertension    ? Incontinence    ? 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 Lichen sclerosus    ? Lichen sclerosus et atrophicus of the vulva 09/22/2015   ? Observation for suspected cardiovascular disease 08/20/2015    04/21/15  Stress Echo :  1.  No exercise induced chest pain.  2.  No ECG evidence of ischemia.  3.  No echocardiographic evidence of ischemia.  4.  Normal  Hemodynamic response to exercise.  5.  No significant exercise induced arrhythmias.  6.  Low risk scan.  7.  Consider medical management if indicated.   ? Urinary tract infection      Surgical History:   Procedure Laterality Date   ? TONSILLECTOMY  1977   ? HX HYSTERECTOMY  2008   ? BLADDER REPAIR  2008    [facility]   ? BLADDER SURGERY 2013    bladder sling   ? CT ANGIOGRAM (NON-INVASIVE)  09/2015   ? UPPER GASTROINTESTINAL ENDOSCOPY  05/26/2017   ? RIGHT RADIOACTIVE SEED LOCALIZED LUMPECTOMY Right 07/06/2017    Performed by Ruthy Dick, DO at [facility] OR   ? IDENTIFICATION SENTINEL LYMPH NODE Right 07/06/2017    Performed by Ruthy Dick, DO at [facility] OR   ? INJECTION RADIOACTIVE TRACER FOR SENTINEL NODE IDENTIFICATION Right 07/06/2017    Performed by Ruthy Dick, DO at [facility] OR   ? SENTINEL LYMPH NODE BIOPSY Right 07/06/2017    Performed by Ruthy Dick, DO at [facility] OR   ? COLONOSCOPY DIAGNOSTIC WITH  SPECIMEN COLLECTION BY BRUSHING/ WASHING - FLEXIBLE N/A 02/01/2018    Performed by Virgina Organ, MD at [facility] OR   ? ESOPHAGOGASTRODUODENOSCOPY WITH BIOPSY - FLEXIBLE N/A 02/01/2018    Performed by Virgina Organ, MD at [facility] OR   ? COLONOSCOPY     ? FOOT SURGERY       Family History   Problem Relation Age of Onset   ? Diabetes Mother    ? Hypertension Mother    ? High Cholesterol Mother    ? Stroke Mother    ? Thyroid Disease Mother    ? Kidney Failure Mother         on HD   ? Diabetes Father         since age 69   ? Heart Failure Father    ? Diabetes Paternal Uncle    ? Cancer Maternal Grandmother    ? Diabetes Maternal Grandfather    ? None Reported Sister    ? Melanoma Neg Hx      Social History     Socioeconomic History   ? Marital status: Married     Spouse name: Not on file   ? Number of children: Not on file   ? Years of education: Not on file   ? Highest education level: Not on file   Occupational History   ? Not on file   Tobacco Use   ? Smoking status: Never Smoker   ? Smokeless tobacco: Never Used   Substance and Sexual Activity   ? Alcohol use: Yes     Alcohol/week: 4.0 standard drinks     Types: 2 Shots of liquor, 2 Standard drinks or equivalent per week     Comment: 2 drinks per week   ? Drug use: No   ? Sexual activity: Not Currently     Partners: Male   Other Topics Concern   ? Not on file   Social History Narrative   ? Not on file                   Objective:         ? cholecalciferol (vitamin D3) (OPTIMAL D3) 50,000 units capsule TAKE ONE CAPSULE BY MOUTH EVERY WEEK   ? clobetasoL (TEMOVATE) 0.05 % topical ointment Apply BID x 2-4 weeks and taper to TIW prn   ? estradioL (ESTRACE) 0.01 % (0.1 mg/g) vaginal cream Apply 0.5 gram to the urethral opening at night for 2 weeks then twice weekly   ? evolocumab (REPATHA SYRINGE) 140 mg/mL injectable SYRINGE Inject 1 mL under the skin every 14 days.   ? hyoscyamine sulfate (LEVSIN/SL) 0.125 mg sublingual tablet Place one tablet under tongue every 4 hours as needed for Other... (Bladder spasms, urgency, pelvic pain). Max 1.5 mg/ day   ? losartan (COZAAR) 50 mg tablet Take 50 mg by mouth daily.   ? metroNIDAZOLE (METROGEL) 0.75 % topical gel Apply  topically to affected area every 48 hours. (Patient taking differently: Apply  topically to affected area every 48 hours. As needed)   ? omeprazole DR (PRILOSEC) 20 mg capsule Take one capsule by mouth daily before breakfast.   ? phenazopyridine (PYRIDIUM) 200 mg tablet Take one tablet by mouth three times daily as needed for Pain. Max 3 days.   ? rosuvastatin (CRESTOR) 5 mg tablet Take one tablet by mouth every 7 days.     Vitals:    04/13/20 1524   Weight: 95.6 kg (210 lb  12.8 oz)   Height: 172.7 cm (5' 8)   PainSc: Zero     Body mass index is 32.05 kg/m?Marland Kitchen     Pain Score: Zero            Pain Addressed:  N/A    Patient Evaluated for a Clinical Trial: No treatment clinical trial available for this patient.     [place] Cooperative Oncology Group performance status is 0, Fully active, able to carry on all pre-disease performance without restriction.Marland Kitchen     Physical Exam  Vitals reviewed.     RIGHT BREAST EXAM:  Breast:  S/p lumpectomy/SLNB. Mild radiation skin changes. No palpable masses  Skin Erythema:  No  Attachment of Overlying Skin:  No  Peau d' orange:  No  Chest Wall Attachment:  No  Nipple Inversion:  No  Nipple Discharge: No    LEFT BREAST EXAM:  Breast: No palpable masses  Skin Erythema:  No  Attachment of Overlying Skin:  No  Peau d' orange:  No  Chest Wall Attachment: No  Nipple Inversion:  No  Nipple Discharge:  No    RIGHT NODAL BASIN EXAM:  Axillary:  negative  Infraclavicular:  negative  Supraclavicular:  negative    LEFT NODAL BASIN EXAM:  Axillary:  negative  Infraclavicular: negative  Supraclavicular:  negative    Constitutional: No acute distress.  HEENT:  Head: Normocephalic and atraumatic.  Eyes: No discharge. No scleral icterus.  Pulmonary/Chest: No respiratory distress.   Neurological: Alert and oriented to person, place and time. No cranial nerve deficit.  Skin: Warm and dry. No rash noted. No erythema. No pallor.  Psychiatric: Normal mood and affect. Behavior is normal. Judgement and thought content normal.            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 She is in the process of building a new home in [place] and it should be done in the next 2 weeks. She denies any changes in her medical or family history. She has no new breast complaints. She continues active surveillance with Dr. Freida Busman for medical oncology. She continues follow up with Dr. Isabella Trevino for radiation oncology. She had bilateral screening mammogram today which was normal. She had a BIS today for arm lymphedema surveillance and has no arm complaints. I will plan to see her back in 1 year with bilateral screening mammogram. She was given ample time to ask questions all of which were answered to her satisfaction. She was encouraged to call with any interval questions or concerns.    1. Continue follow up with Dr. Freida Busman  2. Continue follow up with Dr. Isabella Trevino  3. BIS in 1 year  4. RTC in 1 year with bilateral screening mammogram      Guy Begin, PA-C

## 2020-04-13 NOTE — Progress Notes
Lymphedema Prevention Bioimpedance Spectroscopy (BIS) Monitoring    Notified patient via MyChart result was normal and to continue with routine follow up as scheduled.  Provided clinic contact information for any questions or concerns.     Reviewed BIS testing results from today.  Results:     Current: -4.9  Baseline: 1.3  Change from Baseline: -6.2    WNL less than 3 standard deviation increase from baseline.

## 2020-04-14 ENCOUNTER — Encounter: Admit: 2020-04-14 | Discharge: 2020-04-14 | Payer: MEDICARE

## 2020-04-22 ENCOUNTER — Encounter: Admit: 2020-04-22 | Discharge: 2020-04-22 | Payer: MEDICARE

## 2020-04-22 NOTE — Telephone Encounter
Unice called and states that she needs a exemption letter for her Crestor.  Revecca states that she takes the brand name Crestor once a week,and she does not tolerate the generic formulations.  Medicare is requiring an exemption letter from [employer], APRN before they will cover the medication.    [facility] Attn: Medicare Reviews  [address] [place] [address]  Phone [phone]  fax [phone]    Will route to Ginger McIntosh-James, APRN for recommendations.

## 2020-04-22 NOTE — Telephone Encounter
McIntosh-James, Ginger E, APRN-NP  Baldwin Crown, RN  Caller: Unspecified (Today, 8:38 AM)  Roselyn Reef, I just dictated a note to [facility]. Once transcription gets it done they will send to me and I can edit it and sign it off and then you can print the letter and fax it to [place]. It should be available by the end of the day today. Please just check the patient's chart and the letter should be there so that you can fax it. Thanks. GM       Faxed letter to [facility] as requested.

## 2020-04-27 ENCOUNTER — Encounter: Admit: 2020-04-27 | Discharge: 2020-04-27 | Payer: MEDICARE

## 2020-04-30 ENCOUNTER — Encounter: Admit: 2020-04-30 | Discharge: 2020-04-30 | Payer: MEDICARE

## 2020-04-30 NOTE — Progress Notes
Patient Name:  Teresa Trevino  MRN:  [id number]  DOB:  May 19, 1955  Insurance:  Payor: MEDICARE / Plan: MEDICARE PART A AND B / Product Type: Medicare /          Reason for Visit:  New patient   Diagnosis: Veins      Physician Info:   ? Referring Physician:  No ref. provider found   ? PCP:  Huntington, Melissa      History of Present Illness:  65 year old female referred for bilateral varicose veins with pain.      Scheduled testing: Venous reflux      Location of Films:  IN HOUSE    Date: Imaging: Impression:   04/13/2020 bil ven reflux See worksheet                    Medical History   has a past medical history of Breast cancer (HCC) (01/2017), Chronic leukopenia, Coronary artery disease, COVID-19 (10/2018), Difficulty swallowing, Dyslipidemia, GERD (gastroesophageal reflux disease), Hiatal hernia, History of blood transfusion, History of chemotherapy, History of EKG (01/2017), History of radiation therapy, Hyperlipemia (08/20/2015), Hypertension, Incontinence, ITP (idiopathic thrombocytopenic purpura), Lichen sclerosus, Lichen sclerosus et atrophicus of the vulva (09/22/2015), Observation for suspected cardiovascular disease (08/20/2015), and Urinary tract infection.          Surgical History  has a past surgical history that includes hysterectomy (2008); colonoscopy; Foot surgery; ct angiogram (non-invasive) (09/2015); Upper gastrointestinal endoscopy (05/26/2017); tonsillectomy (1977); Mastectomy, partial (Right, 07/06/2017); lymph node biopsy (Right, 07/06/2017); Colonoscopy (N/A, 02/01/2018); Upper gastrointestinal endoscopy (N/A, 02/01/2018); Bladder surgery (2013); and bladder repair (2008).    Allergies   Allergies   Allergen Reactions   ? Bactrim [Sulfamethoxazole-Trimethoprim] NAUSEA AND VOMITING     Makes me feel ill   ? Erythromycin NAUSEA AND VOMITING   ? Macrobid [Nitrofurantoin Monohyd/M-Cryst] NAUSEA AND VOMITING   ? Sulfa (Sulfonamide Antibiotics) NAUSEA AND VOMITING          Medications:       ? cholecalciferol (vitamin D3) (OPTIMAL D3) 50,000 units capsule TAKE ONE CAPSULE BY MOUTH EVERY WEEK   ? clobetasoL (TEMOVATE) 0.05 % topical ointment Apply BID x 2-4 weeks and taper to TIW prn   ? estradioL (ESTRACE) 0.01 % (0.1 mg/g) vaginal cream Apply 0.5 gram to the urethral opening at night for 2 weeks then twice weekly   ? evolocumab (REPATHA SYRINGE) 140 mg/mL injectable SYRINGE Inject 1 mL under the skin every 14 days.   ? hyoscyamine sulfate (LEVSIN/SL) 0.125 mg sublingual tablet Place one tablet under tongue every 4 hours as needed for Other... (Bladder spasms, urgency, pelvic pain). Max 1.5 mg/ day   ? losartan (COZAAR) 50 mg tablet Take 50 mg by mouth daily.   ? metroNIDAZOLE (METROGEL) 0.75 % topical gel Apply  topically to affected area every 48 hours. (Patient taking differently: Apply  topically to affected area every 48 hours. As needed)   ? omeprazole DR (PRILOSEC) 20 mg capsule Take one capsule by mouth daily before breakfast.   ? phenazopyridine (PYRIDIUM) 200 mg tablet Take one tablet by mouth three times daily as needed for Pain. Max 3 days.   ? rosuvastatin (CRESTOR) 5 mg tablet Take one tablet by mouth every 7 days.           Creatinine:     Comments:

## 2020-05-11 ENCOUNTER — Encounter: Admit: 2020-05-11 | Discharge: 2020-05-11 | Payer: MEDICARE

## 2020-05-11 DIAGNOSIS — I2510 Atherosclerotic heart disease of native coronary artery without angina pectoris: Secondary | ICD-10-CM

## 2020-05-11 DIAGNOSIS — C50919 Malignant neoplasm of unspecified site of unspecified female breast: Secondary | ICD-10-CM

## 2020-05-11 DIAGNOSIS — Z9221 Personal history of antineoplastic chemotherapy: Secondary | ICD-10-CM

## 2020-05-11 DIAGNOSIS — Z923 Personal history of irradiation: Secondary | ICD-10-CM

## 2020-05-11 DIAGNOSIS — N904 Leukoplakia of vulva: Secondary | ICD-10-CM

## 2020-05-11 DIAGNOSIS — R32 Unspecified urinary incontinence: Secondary | ICD-10-CM

## 2020-05-11 DIAGNOSIS — D693 Immune thrombocytopenic purpura: Secondary | ICD-10-CM

## 2020-05-11 DIAGNOSIS — K449 Diaphragmatic hernia without obstruction or gangrene: Secondary | ICD-10-CM

## 2020-05-11 DIAGNOSIS — Z9289 Personal history of other medical treatment: Secondary | ICD-10-CM

## 2020-05-11 DIAGNOSIS — R1310 Dysphagia, unspecified: Secondary | ICD-10-CM

## 2020-05-11 DIAGNOSIS — E785 Hyperlipidemia, unspecified: Secondary | ICD-10-CM

## 2020-05-11 DIAGNOSIS — D72819 Decreased white blood cell count, unspecified: Secondary | ICD-10-CM

## 2020-05-11 DIAGNOSIS — I10 Essential (primary) hypertension: Secondary | ICD-10-CM

## 2020-05-11 DIAGNOSIS — L900 Lichen sclerosus et atrophicus: Secondary | ICD-10-CM

## 2020-05-11 DIAGNOSIS — C50311 Malignant neoplasm of lower-inner quadrant of right female breast: Secondary | ICD-10-CM

## 2020-05-11 DIAGNOSIS — K219 Gastro-esophageal reflux disease without esophagitis: Secondary | ICD-10-CM

## 2020-05-11 DIAGNOSIS — U071 COVID-19: Secondary | ICD-10-CM

## 2020-05-11 DIAGNOSIS — N390 Urinary tract infection, site not specified: Secondary | ICD-10-CM

## 2020-05-11 DIAGNOSIS — Z0389 Encounter for observation for other suspected diseases and conditions ruled out: Secondary | ICD-10-CM

## 2020-05-12 ENCOUNTER — Encounter: Admit: 2020-05-12 | Discharge: 2020-05-12 | Payer: MEDICARE

## 2020-05-12 DIAGNOSIS — E785 Hyperlipidemia, unspecified: Secondary | ICD-10-CM

## 2020-05-12 DIAGNOSIS — I10 Essential (primary) hypertension: Secondary | ICD-10-CM

## 2020-05-12 DIAGNOSIS — I2510 Atherosclerotic heart disease of native coronary artery without angina pectoris: Secondary | ICD-10-CM

## 2020-05-12 DIAGNOSIS — D72819 Decreased white blood cell count, unspecified: Secondary | ICD-10-CM

## 2020-05-12 DIAGNOSIS — U071 COVID-19: Secondary | ICD-10-CM

## 2020-05-12 DIAGNOSIS — C50919 Malignant neoplasm of unspecified site of unspecified female breast: Secondary | ICD-10-CM

## 2020-05-12 DIAGNOSIS — Z9289 Personal history of other medical treatment: Secondary | ICD-10-CM

## 2020-05-12 DIAGNOSIS — Z9221 Personal history of antineoplastic chemotherapy: Secondary | ICD-10-CM

## 2020-05-12 DIAGNOSIS — R32 Unspecified urinary incontinence: Secondary | ICD-10-CM

## 2020-05-12 DIAGNOSIS — R1310 Dysphagia, unspecified: Secondary | ICD-10-CM

## 2020-05-12 DIAGNOSIS — K449 Diaphragmatic hernia without obstruction or gangrene: Secondary | ICD-10-CM

## 2020-05-12 DIAGNOSIS — K219 Gastro-esophageal reflux disease without esophagitis: Secondary | ICD-10-CM

## 2020-05-12 DIAGNOSIS — D693 Immune thrombocytopenic purpura: Secondary | ICD-10-CM

## 2020-05-12 DIAGNOSIS — L900 Lichen sclerosus et atrophicus: Secondary | ICD-10-CM

## 2020-05-12 DIAGNOSIS — N390 Urinary tract infection, site not specified: Secondary | ICD-10-CM

## 2020-05-12 DIAGNOSIS — Z923 Personal history of irradiation: Secondary | ICD-10-CM

## 2020-05-12 DIAGNOSIS — Z0389 Encounter for observation for other suspected diseases and conditions ruled out: Secondary | ICD-10-CM

## 2020-05-12 DIAGNOSIS — N904 Leukoplakia of vulva: Secondary | ICD-10-CM

## 2020-06-23 ENCOUNTER — Encounter: Admit: 2020-06-23 | Discharge: 2020-06-23 | Payer: MEDICARE

## 2020-06-23 ENCOUNTER — Ambulatory Visit: Admit: 2020-06-23 | Discharge: 2020-06-24 | Payer: MEDICARE

## 2020-06-23 DIAGNOSIS — N390 Urinary tract infection, site not specified: Secondary | ICD-10-CM

## 2020-06-23 DIAGNOSIS — R1310 Dysphagia, unspecified: Secondary | ICD-10-CM

## 2020-06-23 DIAGNOSIS — K449 Diaphragmatic hernia without obstruction or gangrene: Secondary | ICD-10-CM

## 2020-06-23 DIAGNOSIS — E785 Hyperlipidemia, unspecified: Secondary | ICD-10-CM

## 2020-06-23 DIAGNOSIS — Z923 Personal history of irradiation: Secondary | ICD-10-CM

## 2020-06-23 DIAGNOSIS — L900 Lichen sclerosus et atrophicus: Secondary | ICD-10-CM

## 2020-06-23 DIAGNOSIS — I872 Venous insufficiency (chronic) (peripheral): Secondary | ICD-10-CM

## 2020-06-23 DIAGNOSIS — K219 Gastro-esophageal reflux disease without esophagitis: Secondary | ICD-10-CM

## 2020-06-23 DIAGNOSIS — D693 Immune thrombocytopenic purpura: Secondary | ICD-10-CM

## 2020-06-23 DIAGNOSIS — U071 COVID-19: Secondary | ICD-10-CM

## 2020-06-23 DIAGNOSIS — E7800 Pure hypercholesterolemia, unspecified: Secondary | ICD-10-CM

## 2020-06-23 DIAGNOSIS — Z9289 Personal history of other medical treatment: Secondary | ICD-10-CM

## 2020-06-23 DIAGNOSIS — R252 Cramp and spasm: Secondary | ICD-10-CM

## 2020-06-23 DIAGNOSIS — D72819 Decreased white blood cell count, unspecified: Secondary | ICD-10-CM

## 2020-06-23 DIAGNOSIS — R32 Unspecified urinary incontinence: Secondary | ICD-10-CM

## 2020-06-23 DIAGNOSIS — C50919 Malignant neoplasm of unspecified site of unspecified female breast: Secondary | ICD-10-CM

## 2020-06-23 DIAGNOSIS — I2510 Atherosclerotic heart disease of native coronary artery without angina pectoris: Secondary | ICD-10-CM

## 2020-06-23 DIAGNOSIS — Z9221 Personal history of antineoplastic chemotherapy: Secondary | ICD-10-CM

## 2020-06-23 DIAGNOSIS — I10 Essential (primary) hypertension: Secondary | ICD-10-CM

## 2020-06-23 DIAGNOSIS — Z0389 Encounter for observation for other suspected diseases and conditions ruled out: Secondary | ICD-10-CM

## 2020-06-23 DIAGNOSIS — N904 Leukoplakia of vulva: Secondary | ICD-10-CM

## 2020-06-23 NOTE — Progress Notes
Date of Service: 06/23/2020              Chief Complaint   Patient presents with   ? Varicose Vein       History of Present Illness    65 year old female who complains primarily of bilateral leg cramps at night.  She denies significant pain.  She states that her legs occasionally swell as well.    She had venous duplex imaging on April 13, 2020 which demonstrates reflux in the left great saphenous vein however the vein is quite small measuring 2.4 mm in diameter.  There is no deep venous insufficiency or obstruction.    She has a history of hypercholesterolemia for which she takes medication.  She is a nondiabetic and a non-smoker.  She has no issues with walking.  He does have a history of hypercholesterolemia for which she takes Crestor.  She is 3 years out from breast cancer therapy.    Medical History:   Diagnosis Date   ? Breast cancer (HCC) 01/2017    right IDC   ? Chronic leukopenia    ? Coronary artery disease    ? COVID-19 10/2018   ? Difficulty swallowing     past issue during chemotherapy-no longer an issue per pt   ? Dyslipidemia    ? GERD (gastroesophageal reflux disease)    ? Hiatal hernia    ? History of blood transfusion     during chemo therapy   ? History of chemotherapy    ? History of EKG 01/2017   ? History of radiation therapy    ? Hyperlipemia 08/20/2015   ? Hypertension    ? Incontinence    ? 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 Lichen sclerosus    ? Lichen sclerosus et atrophicus of the vulva 09/22/2015   ? Observation for suspected cardiovascular disease 08/20/2015    04/21/15  Stress Echo :  1.  No exercise induced chest pain.  2.  No ECG evidence of ischemia.  3.  No echocardiographic evidence of ischemia.  4.  Normal  Hemodynamic response to exercise.  5.  No significant exercise induced arrhythmias.  6.  Low risk scan.  7.  Consider medical management if indicated.   ? Urinary tract infection        Surgical History:   Procedure Laterality Date   ? TONSILLECTOMY  1977   ? HX HYSTERECTOMY  2008   ? BLADDER REPAIR  2008    [facility]   ? BLADDER SURGERY  2013    bladder sling   ? CT ANGIOGRAM (NON-INVASIVE)  09/2015   ? UPPER GASTROINTESTINAL ENDOSCOPY  05/26/2017   ? RIGHT RADIOACTIVE SEED LOCALIZED LUMPECTOMY Right 07/06/2017    Performed by Ruthy Dick, DO at [facility] OR   ? IDENTIFICATION SENTINEL LYMPH NODE Right 07/06/2017    Performed by Ruthy Dick, DO at [facility] OR   ? INJECTION RADIOACTIVE TRACER FOR SENTINEL NODE IDENTIFICATION Right 07/06/2017    Performed by Ruthy Dick, DO at [facility] OR   ? SENTINEL LYMPH NODE BIOPSY Right 07/06/2017    Performed by Ruthy Dick, DO at [facility] OR   ? COLONOSCOPY DIAGNOSTIC WITH SPECIMEN COLLECTION BY BRUSHING/ WASHING - FLEXIBLE N/A 02/01/2018    Performed by Virgina Organ, MD at [facility] OR   ? ESOPHAGOGASTRODUODENOSCOPY WITH BIOPSY - FLEXIBLE N/A 02/01/2018    Performed by Virgina Organ, MD at [facility] OR   ?  COLONOSCOPY     ? FOOT SURGERY         Allergies:  Allergies   Allergen Reactions   ? Bactrim [Sulfamethoxazole-Trimethoprim] NAUSEA AND VOMITING     Makes me feel ill   ? Erythromycin NAUSEA AND VOMITING   ? Macrobid [Nitrofurantoin Monohyd/M-Cryst] NAUSEA AND VOMITING   ? Sulfa (Sulfonamide Antibiotics) NAUSEA AND VOMITING       Medication List:  ? cholecalciferol (vitamin D3) (OPTIMAL D3) 50,000 units capsule TAKE ONE CAPSULE BY MOUTH EVERY WEEK   ? clobetasoL (TEMOVATE) 0.05 % topical ointment Apply BID x 2-4 weeks and taper to TIW prn   ? estradioL (ESTRACE) 0.01 % (0.1 mg/g) vaginal cream Apply 0.5 gram to the urethral opening at night for 2 weeks then twice weekly   ? evolocumab (REPATHA SYRINGE) 140 mg/mL injectable SYRINGE Inject 1 mL under the skin every 14 days.   ? hyoscyamine sulfate (LEVSIN/SL) 0.125 mg sublingual tablet Place one tablet under tongue every 4 hours as needed for Other... (Bladder spasms, urgency, pelvic pain). Max 1.5 mg/ day   ? losartan (COZAAR) 50 mg tablet Take 50 mg by mouth daily.   ? metroNIDAZOLE (METROGEL) 0.75 % topical gel Apply  topically to affected area every 48 hours. (Patient taking differently: Apply  topically to affected area every 48 hours. As needed)   ? omeprazole DR (PRILOSEC) 20 mg capsule Take one capsule by mouth daily before breakfast.   ? phenazopyridine (PYRIDIUM) 200 mg tablet Take one tablet by mouth three times daily as needed for Pain. Max 3 days.   ? rosuvastatin (CRESTOR) 5 mg tablet Take one tablet by mouth every 7 days.       Social History:   reports that she has never smoked. She has never used smokeless tobacco. She reports current alcohol use of about 4.0 standard drinks of alcohol per week. She reports that she does not use drugs.    Family History   Problem Relation Age of Onset   ? Diabetes Mother    ? Hypertension Mother    ? High Cholesterol Mother    ? Stroke Mother    ? Thyroid Disease Mother    ? Kidney Failure Mother         on HD   ? Diabetes Father         since age 54   ? Heart Failure Father    ? Diabetes Paternal Uncle    ? Cancer Maternal Grandmother    ? Diabetes Maternal Grandfather    ? None Reported Sister    ? Melanoma Neg Hx        Review of Systems   Constitutional: Negative for activity change, appetite change, chills, diaphoresis, fatigue, fever and unexpected weight change.   HENT: Negative for hearing loss, nosebleeds and tinnitus.    Eyes: Negative for photophobia, itching and visual disturbance.   Respiratory: Negative for apnea, cough, chest tightness and shortness of breath.    Cardiovascular: Negative for chest pain, palpitations and leg swelling.   Gastrointestinal: Negative for abdominal pain, blood in stool, constipation, diarrhea, nausea and vomiting.   Endocrine: Negative for cold intolerance and heat intolerance.   Genitourinary: Negative for dysuria, frequency and hematuria.   Musculoskeletal: Negative for arthralgias, back pain, gait problem, joint swelling and myalgias.   Skin: Negative for color change, rash and wound.   Allergic/Immunologic: Negative for environmental allergies, food allergies and immunocompromised state.   Neurological: Negative for dizziness, tremors, seizures, syncope, facial  asymmetry, speech difficulty, weakness, light-headedness and headaches.   Hematological: Negative for adenopathy. Does not bruise/bleed easily.   Psychiatric/Behavioral: Negative for behavioral problems, confusion and dysphoric mood. The patient is not nervous/anxious.                Vitals:    06/23/20 1520   BP: (!) 145/102   BP Source: Arm, Left Upper   Pulse: 101   Weight: 93.9 kg (207 lb)   Height: 172.7 cm (5' 8)     Body mass index is 31.47 kg/m?Marland Kitchen     Physical Exam  Constitutional:       Appearance: Normal appearance. She is normal weight.   HENT:      Head: Normocephalic and atraumatic.   Neck:      Comments: No carotid bruits  Cardiovascular:      Rate and Rhythm: Normal rate and regular rhythm.      Pulses: Normal pulses.      Comments: No significant varicose veins.  2+ palpable bilateral pedal pulses.  Both calf circumferences measure the same  Pulmonary:      Effort: Pulmonary effort is normal.      Breath sounds: Normal breath sounds.   Neurological:      General: No focal deficit present.      Mental Status: She is alert and oriented to person, place, and time.             Assessment and Plan:      Impression: #1 bilateral nocturnal leg cramps.  2.  Evidence of mild venous insufficiency involving the left great saphenous vein.  3.  History of hypercholesterolemia    Recommendation: #1 recommend daily walking for at least 20 minutes.  2.  Recommend leg stretches prior to bed andTonic water which has quinine in it.  3.  Knee-high compression stockings as needed    Problem List Items Addressed This Visit     Hypercholesterolemia    Bilateral leg cramps - Primary    Venous insufficiency of left leg

## 2020-07-31 ENCOUNTER — Encounter: Admit: 2020-07-31 | Discharge: 2020-07-31 | Payer: MEDICARE

## 2020-08-28 ENCOUNTER — Encounter: Admit: 2020-08-28 | Discharge: 2020-08-28 | Payer: MEDICARE

## 2020-08-28 DIAGNOSIS — D693 Immune thrombocytopenic purpura: Secondary | ICD-10-CM

## 2020-08-28 DIAGNOSIS — N390 Urinary tract infection, site not specified: Principal | ICD-10-CM

## 2020-08-28 DIAGNOSIS — L900 Lichen sclerosus et atrophicus: Secondary | ICD-10-CM

## 2020-08-28 DIAGNOSIS — K219 Gastro-esophageal reflux disease without esophagitis: Secondary | ICD-10-CM

## 2020-08-28 DIAGNOSIS — E785 Hyperlipidemia, unspecified: Secondary | ICD-10-CM

## 2020-08-28 DIAGNOSIS — C50919 Malignant neoplasm of unspecified site of unspecified female breast: Secondary | ICD-10-CM

## 2020-08-28 DIAGNOSIS — Z0389 Encounter for observation for other suspected diseases and conditions ruled out: Secondary | ICD-10-CM

## 2020-08-28 DIAGNOSIS — K449 Diaphragmatic hernia without obstruction or gangrene: Secondary | ICD-10-CM

## 2020-08-28 DIAGNOSIS — Z923 Personal history of irradiation: Secondary | ICD-10-CM

## 2020-08-28 DIAGNOSIS — N3941 Urge incontinence: Secondary | ICD-10-CM

## 2020-08-28 DIAGNOSIS — N3943 Post-void dribbling: Secondary | ICD-10-CM

## 2020-08-28 DIAGNOSIS — R32 Unspecified urinary incontinence: Secondary | ICD-10-CM

## 2020-08-28 DIAGNOSIS — R351 Nocturia: Secondary | ICD-10-CM

## 2020-08-28 DIAGNOSIS — I10 Essential (primary) hypertension: Secondary | ICD-10-CM

## 2020-08-28 DIAGNOSIS — R1310 Dysphagia, unspecified: Secondary | ICD-10-CM

## 2020-08-28 DIAGNOSIS — U071 COVID-19: Secondary | ICD-10-CM

## 2020-08-28 DIAGNOSIS — Z9221 Personal history of antineoplastic chemotherapy: Secondary | ICD-10-CM

## 2020-08-28 DIAGNOSIS — I2510 Atherosclerotic heart disease of native coronary artery without angina pectoris: Secondary | ICD-10-CM

## 2020-08-28 DIAGNOSIS — D72819 Decreased white blood cell count, unspecified: Secondary | ICD-10-CM

## 2020-08-28 DIAGNOSIS — N904 Leukoplakia of vulva: Secondary | ICD-10-CM

## 2020-08-28 DIAGNOSIS — Z9289 Personal history of other medical treatment: Secondary | ICD-10-CM

## 2020-08-28 NOTE — Assessment & Plan Note
See above  Discussed trial of Levsin, QHS, to help reduce nocturia (she has Rx)  Reviewed nocturia, bilateral foot neuropathy and increased risk of falling  Limit fluids QHS  Elevate legs in the evening  FU with PMD re: increased neuropathy  Also discussed, trial of amitriptyline 10mg  QHS, hold for now

## 2020-08-28 NOTE — Progress Notes
Date of Service: 08/28/2020    Subjective:             Teresa Trevino is a 65 y.o. female.here for follow up    Chief Complaint   Patient presents with   ? Bladder infection         History of Present Illness    Teresa Trevino is a 65 y.o. female, with a PMhx   Patient Active Problem List    Diagnosis Date Noted   ? Lichen sclerosus    ? Bilateral leg cramps 06/23/2020   ? Venous insufficiency of left leg 06/23/2020   ? Neuropathy 10/24/2019   ? Pelvic pain 08/08/2019   ? Gastroesophageal reflux disease 05/11/2017   ? Essential hypertension 03/02/2017   ? Thrombocytopenia (HCC) 02/16/2017   ? Coronary artery disease involving native coronary artery of native heart without angina pectoris 01/30/2017   ? Malignant neoplasm of lower-inner quadrant of right breast of female, estrogen receptor negative (HCC) 01/24/2017   ? Nocturia 01/2017   ? Post-void dribbling 01/2017   ? Lichen sclerosus et atrophicus of the vulva 09/22/2015   ? Agatston coronary artery calcium score between 200 and 399 08/21/2015   ? Hypercholesterolemia 08/20/2015   ? Vulvar lesion 03/12/2015   ? Recurrent UTI 09/11/2014   ? Dyspareunia, female 05/13/2013   ? Vaginal atrophy 05/13/2013   ? Urge incontinence of urine 05/13/2013   ? Mixed incontinence urge and stress (female)(female) 10/19/2011         ?  Presents,?08/06/19, Urology Alisia Ferrari, PA-C.   ?  Referred by,?H[place], Melissa, [place]?, for recurrent UTIs.  ?  She presents for evaluation of recurrent urine tract infections usually are 3 or 4/year, and significant increase since April 2021. ?She denies catheterized cultures. ?She denies pyelonephritis.  ?  Currently on suppressive Cipro 250 mg daily x1 year per her primary care provider  ?  She reports history of urinary tract infections as a child, hospitalization, denies diagnosis of urinary reflux.  ?  She reports signs and symptoms include dysuria, increased pelvic pressure.  ?  She reports a fair stream, sits with voiding, reports bothersome dribbling. ?Urgency, urge incontinence wearing pads changing 4 times daily. ?Nocturia x5, denies nocturnal enuresis, denies stress or incontinence.  ?  History of bladder injury in 2008 with hysterectomy. ?Reports bladder surgery with urogynecology's in 2015 and URD evaluation at that time.?  ?  Bowel movement-fecal incontinence and constipation.  ?  History of lichen sclerosis using clobetasol and vaginal estrogen cream.  ?  Intake water and tea. ?Dietary irritants ~ tea and coffee.  ?  Hysterectomy, see above, denies vaginal bleeding or prolapse recurrence.??She is not sexually active.  ?  She takes showers, denies taking baths or using baby wipes.  ?  She completed pelvic floor physical therapy years ago.  ?  She takes a probiotic daily.  ?  She denies gross hematuria, fever chills, nausea vomiting, recent urology evaluation, history of urinary retention, history of self-catheterization, kidney stones, diabetes, neurological history, spinal surgery, pelvic trauma, family history logic cancer, tobacco, history of sexual abuse, vitamin cocktail, Levsin, over-the-counter bladder medications, overactive bladder medications, vaginal Valium.  ?  Reviewed chart:  ?  Dr Gaspar Bidding 12/2011?OPERATIVE PROCEDURE:  1. Midurethral sling using Align R Retropubic Urethral Support System. ?Reference number is [id number]. ?Lot number is [id number]. ?Expiration date is September 2015.  2. Cystourethroscopy.  ?  ????  ?  ?10/30/2019 Dr Littie Deeds, Operative Procedure: ?Cystourethroscopy unremarkable  ??  ??    ?  02/11/20, for follow up.  She reports no urinary tract infections since last visit.  Voiding every 2 hours, nocturia x4.  Reports urgency, urge incontinence wearing pads usually changing once a day.  ?She is using vaginal estrogen cream and clobetasol.  Not using Levsin or MetroGel at this time.  ?Discontinue Culturelle.  Bowel movements regular daily.  ?    Last office visit, 02/11/2020 with Gershon Cull, PA-C, plan:    Recurrent UTI  UA micro and urine culture, no antibiotics  UA micro and Urine culture in the future with S&S of UTI, reviewed instructions and orders entered, printed orders, may have completed locally, reviewed we may need assistance requesting outside reports for review  ?recommend vitamin cocktail daily  avoid dietary irritants  Continue vaginal estrace cream, reviewed application and handout provided, 3 x weekly QHS  Continue clobetasol, as directed with gynecology, patient requesting referral to dermatology  recommend ?probiotic daily  Recommend theraworx perineal cleanser, history of fecal incontinence  ?previously discussed, repeating PFPT, best if at  with her history of pelvic pain  ??recommend FU 6 months  ??  Vaginal atrophy  ?See above  Note she has been using per other providers (history of breast cancer)  ?  Urge incontinence of urine  Hold on OAB Rx at this time  Trial of levsin, reviewed if helpful and taking often an OAB Rx daily dosing, maybe helpful  OAB list given for her to review, hold on ordering at this time  ?Recommend repeating PFPT, she defers at this time  Hold on URD evaluation at this time  ?See above  ??  Lichen sclerosus et atrophicus of the vulva  Continue working with gynecology  Clobetasol vaginal cream per gynecology  Previous appt she requested referral to Dermatology as well  ??  Breast cancer (HCC)  ?Using vaginal estrace cream, per other providers  If approved with her other providers, recommend continued use  Reviewed Urology recommendations and vaginal estrace cream  ??  Nocturia  See above  Limit fluids in the evening  Will monitor  Hold on OAB Rx at this time  Recommend trial of levsin, QHS  ??  Pelvic pain  See above  May consider atarax in the future  ??  Post-void dribbling  Reviewed vaginal voiding and techniques  ?      08/28/2020, for follow up.  Doing well, no urinary complaints.    She reports UTI treated with her primary care provider in June 2022, denies fever chills or gross hematuria, improved after taking antibiotics.    She reports voiding every 2-3 hours during the day, occasional urgency and urge incontinence wears a pad occasionally.  Nocturia x4.    She reports increased bilateral feet numbness, she denies recent falls.  Encouraged her to review with her primary care provider.    Bowel movement-improved loose stools.  Intake-water, tea (has noticed tea is a dietary irritant limits)    She reports occasionally using vaginal estrogen cream, using clobetasol for lichen sclerosus, Theraworx perineal cleanser as needed.  She discontinued daily probiotic and noticed improvement with her bowels.  Also discontinued vaginal MetroGel, prefers not to use at this time and doing well with recurrent UTIs.      Denies tobacco history  Denies family history of urologic cancer  ?  Denies dysuria, hematuria, UTI, stress incontinence, nocturnal enuresis, fever chills, flank pain.  ?  ?  ?  ?  ?  PVR  08/06/19 PVR 23cc  ?  AUA  02/11/20?AUASS: 20?QoL: 5  ?            Medical History:   Diagnosis Date   ? Breast cancer (HCC) 01/2017    right IDC   ? Chronic leukopenia    ? Coronary artery disease    ? COVID-19 10/2018   ? Difficulty swallowing     past issue during chemotherapy-no longer an issue per pt   ? Dyslipidemia    ? GERD (gastroesophageal reflux disease)    ? Hiatal hernia    ? History of blood transfusion     during chemo therapy   ? History of chemotherapy    ? History of EKG 01/2017   ? History of radiation therapy    ? Hyperlipemia 08/20/2015   ? Hypertension    ? Incontinence    ? 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 Lichen sclerosus    ? Lichen sclerosus et atrophicus of the vulva 09/22/2015   ? Observation for suspected cardiovascular disease 08/20/2015    04/21/15  Stress Echo :  1.  No exercise induced chest pain.  2.  No ECG evidence of ischemia.  3.  No echocardiographic evidence of ischemia.  4.  Normal  Hemodynamic response to exercise.  5.  No significant exercise induced arrhythmias.  6.  Low risk scan.  7.  Consider medical management if indicated.   ? Urinary tract infection          Surgical History:   Procedure Laterality Date   ? TONSILLECTOMY  1977   ? HX HYSTERECTOMY  2008   ? BLADDER REPAIR  2008    [facility]   ? BLADDER SURGERY  2013    bladder sling   ? CT ANGIOGRAM (NON-INVASIVE)  09/2015   ? UPPER GASTROINTESTINAL ENDOSCOPY  05/26/2017   ? RIGHT RADIOACTIVE SEED LOCALIZED LUMPECTOMY Right 07/06/2017    Performed by Ruthy Dick, DO at [facility] OR   ? IDENTIFICATION SENTINEL LYMPH NODE Right 07/06/2017    Performed by Ruthy Dick, DO at [facility] OR   ? INJECTION RADIOACTIVE TRACER FOR SENTINEL NODE IDENTIFICATION Right 07/06/2017    Performed by Ruthy Dick, DO at [facility] OR   ? SENTINEL LYMPH NODE BIOPSY Right 07/06/2017    Performed by Ruthy Dick, DO at [facility] OR   ? COLONOSCOPY DIAGNOSTIC WITH SPECIMEN COLLECTION BY BRUSHING/ WASHING - FLEXIBLE N/A 02/01/2018    Performed by Virgina Organ, MD at [facility] OR   ? ESOPHAGOGASTRODUODENOSCOPY WITH BIOPSY - FLEXIBLE N/A 02/01/2018    Performed by Virgina Organ, MD at [facility] OR   ? COLONOSCOPY     ? FOOT SURGERY         Allergies:    Allergies   Allergen Reactions   ? Bactrim [Sulfamethoxazole-Trimethoprim] NAUSEA AND VOMITING     Makes me feel ill   ? Erythromycin NAUSEA AND VOMITING   ? Macrobid [Nitrofurantoin Monohyd/M-Cryst] NAUSEA AND VOMITING   ? Sulfa (Sulfonamide Antibiotics) NAUSEA AND VOMITING         Social History     Socioeconomic History   ? Marital status: Married   Tobacco Use   ? Smoking status: Never Smoker   ? Smokeless tobacco: Never Used   Substance and Sexual Activity   ? Alcohol use: Yes     Alcohol/week: 4.0 standard drinks     Types: 2 Shots of liquor, 2 Standard drinks or equivalent per week  Comment: 2 drinks per week   ? Drug use: No   ? Sexual activity: Not Currently     Partners: Male          Family History   Problem Relation Age of Onset ? Diabetes Mother    ? Hypertension Mother    ? High Cholesterol Mother    ? Stroke Mother    ? Thyroid Disease Mother    ? Kidney Failure Mother         on HD   ? Diabetes Father         since age 68   ? Heart Failure Father    ? Diabetes Paternal Uncle    ? Cancer Maternal Grandmother    ? Diabetes Maternal Grandfather    ? None Reported Sister    ? Melanoma Neg Hx             Review of Systems    Objective:         ? cholecalciferol (vitamin D3) (OPTIMAL D3) 50,000 units capsule TAKE ONE CAPSULE BY MOUTH EVERY WEEK   ? clobetasoL (TEMOVATE) 0.05 % topical ointment Apply BID x 2-4 weeks and taper to TIW prn   ? estradioL (ESTRACE) 0.01 % (0.1 mg/g) vaginal cream Apply 0.5 gram to the urethral opening at night for 2 weeks then twice weekly   ? evolocumab (REPATHA SYRINGE) 140 mg/mL injectable SYRINGE Inject 1 mL under the skin every 14 days.   ? hyoscyamine sulfate (LEVSIN/SL) 0.125 mg sublingual tablet Place one tablet under tongue every 4 hours as needed for Other... (Bladder spasms, urgency, pelvic pain). Max 1.5 mg/ day   ? losartan (COZAAR) 50 mg tablet Take 50 mg by mouth daily.   ? omeprazole DR (PRILOSEC) 20 mg capsule Take one capsule by mouth daily before breakfast.   ? phenazopyridine (PYRIDIUM) 200 mg tablet Take one tablet by mouth three times daily as needed for Pain. Max 3 days.   ? rosuvastatin (CRESTOR) 5 mg tablet Take one tablet by mouth every 7 days.       Vitals:    08/28/20 0814   BP: 125/82   BP Source: Arm, Left Upper   Pulse: 67       There is no height or weight on file to calculate BMI.       Physical Exam  Constitutional:       Appearance: She is well-developed.   HENT:      Head: Normocephalic.   Eyes:      Conjunctiva/sclera: Conjunctivae normal.   Cardiovascular:      Rate and Rhythm: Normal rate.   Pulmonary:      Effort: Pulmonary effort is normal.   Abdominal:      Palpations: Abdomen is soft.   Musculoskeletal:         General: Normal range of motion.      Cervical back: Normal range of motion.   Skin:     General: Skin is warm and dry.   Neurological:      Mental Status: She is alert and oriented to person, place, and time.                Most Recent Available labs:    Comprehensive Metabolic Profile    Lab Results   Component Value Date/Time    NA 138 02/04/2020 12:00 AM    K 3.8 02/04/2020 12:00 AM    CL 103 02/04/2020 12:00 AM    CO2 25.0  02/04/2020 12:00 AM    GAP 12 06/18/2019 12:00 AM    BUN 18.0 02/04/2020 12:00 AM    CR 0.77 02/04/2020 12:00 AM    GLU 95 02/04/2020 12:00 AM    Lab Results   Component Value Date/Time    CA 9.3 02/04/2020 12:00 AM    ALBUMIN 3.9 02/04/2020 12:00 AM    TOTPROT 6.7 02/04/2020 12:00 AM    ALKPHOS 41 02/04/2020 12:00 AM    AST 23 02/04/2020 12:00 AM    ALT 24 02/04/2020 12:00 AM    TOTBILI 0.60 02/04/2020 12:00 AM    GFR 79.0 06/18/2019 12:00 AM    GFRAA >60 06/22/2017 12:51 PM        Urine    Lab Results   Component Value Date/Time    UCOLOR yellow 02/11/2020 11:22 AM    TURBID clear 02/11/2020 11:22 AM    USPGR 1.017 09/14/2017 11:41 AM    UPH 6.0 09/14/2017 11:41 AM    UPROTEIN 2+ (A) 09/14/2017 11:41 AM    UAGLU NEG 09/14/2017 11:41 AM    UKET NEG 09/14/2017 11:41 AM    UBILE NEG 09/14/2017 11:41 AM    UBLD 1+ (A) 09/14/2017 11:41 AM    UROB NORMAL 09/14/2017 11:41 AM    Lab Results   Component Value Date/Time    UNIT NEG 09/14/2017 11:41 AM    ULEU 3+ (A) 09/14/2017 11:41 AM    UWBC PACKED 02/11/2020 11:22 AM    URBC 0-2 02/11/2020 11:22 AM        Lab Results   Component Value Date    URBC 0-2 02/11/2020    URBC 0-2 08/06/2019       Culture   Date Value Ref Range Status   02/11/2020 <100,000 CFU/ml  ESCHERICHIA COLI   (A)  Final       No results found for: HGBA1C      Outside labs or records for review: not available at the time of review    ~No outside urine culture from recent UTI June 2022 for review      [facility]: no recent abdominal or pelvic imaging for review      Outside imaging: see report  ?  CT urogram-August 29, 2019-see report, see chart  Impression no hydronephrosis or renal calculi, no renal mass lesions are seen  Air in the urinary bladder likely reflects recent instrumentation.  Bladder is not well distended.  No obvious filling defects are seen  ?    Assessment and Plan:    Assessment and Plan:    Recurrent UTI  see above  -UA micro and Urine culture in the future with S&S of UTI, reviewed instructions and orders entered (noted she may have labs completed at PMD office due to location)  -avoid dietary irritants  -Continue vaginal estrace cream, as directed, usually using 2-3/week  -Reviewed PFPT at previous visit, she defers at this time, doing well, will monitor  -Recommend Theraworx perineal cleanser, over the counter, reviewed   -we will mail, AVS, copy of urine cultures, orders and note not completed at the time of check out  recommend FU 6  months        Urge incontinence of urine  See above  Will monitor  Defers daily OAB Rx trial at this time  Defers PFPT at this time  Avoid dietary irritants    Lichen sclerosus  See above  Continue clobetasol per other Providers, as directed  Nocturia  See above  Discussed trial of Levsin, QHS, to help reduce nocturia (she has Rx)  Reviewed nocturia, bilateral foot neuropathy and increased risk of falling  Limit fluids QHS  Elevate legs in the evening  FU with PMD re: increased neuropathy  Also discussed, trial of amitriptyline 10mg  QHS, hold for now      Post-void dribbling  See above  Reviewed vaginal voiding techniques       Orders Placed This Encounter   ? CULTURE-URINE W/SENSITIVITY   ? URINALYSIS, MICROSCOPIC   ? POC URINE DIPSTICK          *reviewed to contact our office, in 1 week, if recommendations, orders and imaging not available at the time of appointment and we have not contacted them to review.    Gershon Cull, PA-C  Urology      Will send letter to PMD - [place]           Future Appointments   Date Time Provider Department Center   09/14/2020  9:00 AM McIntosh-James, Ginger E, APRN-NP MACST[facility]ECL CVM Exam   10/27/2020 11:00 AM Reola Mosher, MD [facility]THRAD[facility] [place] [facility]   11/24/2020 11:00 AM Melany Guernsey, Marin Roberts, APRN-NP [facility]N OB/GYN   02/01/2021 11:00 AM Alwyn Pea, DO [facility]CCNORTHE[facility] [place] Exam   03/05/2021 10:00 AM Marshia Ly, PA-C [facility] Urology   04/14/2021 10:45 AM MAMMO - IC ROOM 2 IC1MAMM [facility] Radiolog   04/14/2021 11:30 AM Hoyle Sauer, Pincus Large, PA-C IC1EXRM [place] Exam   04/14/2021 11:30 AM BIS IC1 - BIOIMPEDENCE SPECTROSCOPY [id number]  Exam       Return in about 6 months (around 02/28/2021) for *see check out note below*.    For my review this note may have been completed via dictation, using Dragon ~ Gershon Cull, PA-C        CC'd Chart Routing History  Routing History    From: Melina Fiddler On: 08/28/2020 05:40 PM   To: Rudell Cobb, August   Priority: Routine   Routing Comments:   August,     Please mail:   -a copy of AVS   -a copy of urine micro and urine culture order reqs for her to take to PMD     Thank you ~ Danella Sensing       Visit Disposition     Dispositions Check-out Note    ? Return in about 6 months (around 02/28/2021) for *see check out note below*.   Orders and FU were not completed prior to her checkout  Advised we would contact her and mail orders and copy of AVS    FU 6 months

## 2020-08-28 NOTE — Assessment & Plan Note
See above  Continue clobetasol per other Providers, as directed

## 2020-08-28 NOTE — Assessment & Plan Note
See above  Reviewed vaginal voiding techniques

## 2020-08-28 NOTE — Patient Instructions
It was a pleasure seeing you today, sometimes we may not have the final results, final recommendations, or dates, appointments and referrals scheduled when you check out.   Please contact our office, in 1 week, if recommendations, orders and imaging not available at the time of your appointment and we have not contacted you to review.      Please contact our office by phone or MyChart messaging     Gershon Cull, PA-C  Department of Urology  [facility]  [address]  [place], [place] [address]  Phone [phone]    fax [phone]      To make an appointment call Assistant:   Phone [phone]      If you need a medication refill have call Nurse and leave a message, Phone [phone]      If you need to fax records for review, please fax to: [phone]      *sorry we were not able to complete your checkout at your visit today*      Recommend urine cultures with signs and symptoms of infection:   Please contact office or MyChart Message, with current signs and symptoms and to notify that urine culture was submitted.  If submitted outside of [facility], please forward results  Gershon Cull, PA-C  Department of Urology  [facility]  [address]  [place], [place] [address]  Phone [phone]    fax [phone]          [facility]     [email or web address]                    [email or web address]          Recommend trial of Levsin    - this medication should be inexpensive, if it is not covered or greater than $25 with your insurance, contact our office and we can review using a coupon card, Good Rx with a written prescription    - levsin can help with bladder pressure, urethral discomfort, urgency and frequency     - some patients use this medication daily, use occasionally with flares, as needed, and/or nightly to help with primary nighttime bothersome urinary symptoms.    - If taking levsin is helpful and you are taking often, contact our office and we can discuss using a different daily bladder medication, that would be a single dose every 24 hours.    - I usually start with a smaller quantity, to see if the medication is helpful, if you need a larger quantity to help manage your symptoms on a frequent basis, contact our office and we can adjust the prescription        Hyoscyamine sublingual tablet  Brand Names: A-Spas S/L, HyoMax-SL, Hyosol SL, Levsin SL, OSCIMIN, Symax  What is this medicine?  HYOSCYAMINE (hye oh SYE a meen) is used to treat stomach and bladder problems. This medicine is also used for rhinitis, to reduce some problems caused by Parkinson's disease, and for the treatment of poisoning with drugs that are usually used to treat myasthenia gravis.  How should I use this medicine?  Take this medicine by mouth. Follow the directions on the prescription label. These tablets may be placed under the tongue, and allowed to dissolve, swallowed whole, or chewed. Take your doses at regular intervals. Do not take your medicine more often than directed.  Talk to your pediatrician regarding the use of  this medicine in children. Special care may be needed. While this medicine may be prescribed for children as young as 12 years for selected conditions, precautions do apply.  What side effects may I notice from receiving this medicine?  Side effects that you should report to your doctor or health care professional as soon as possible:  ? anxiety, nervousness  ? confusion  ? dizziness or fainting spells  ? fast heartbeat  ? fever  ? pain or difficulty passing urine  ? unusually weak or tired  ? vomiting  Side effects that usually do not require medical attention (report to your doctor or health care professional if they continue or are bothersome):  ? altered taste  ? constipation  ? nausea  What may interact with this medicine?  ? amantadine  ? antacids  ? benztropine  ? donepezil  ? galantamine  ? medicines for hay fever and other allergies  ? medicines for mental depression  ? medicines for mental problems or psychotic disturbances  ? rivastigmine  ? tacrine    What if I miss a dose?  If you miss a dose, take it as soon as you can. If it is almost time for your next dose, take only that dose. Do not take double or extra doses.  Where should I keep my medicine?  Keep out of the reach of children.  Store at room temperature between 15 and 30 degrees C (59 and 86 degrees F). Throw away any unused medicine after the expiration date.  What should I tell my health care provider before I take this medicine?  They need to know if you have any of these conditions:  ? difficulty passing urine  ? glaucoma  ? heart disease, or previous heart attack  ? myasthenia gravis  ? prostate trouble  ? stomach obstruction  ? ulcerative colitis  ? an unusual or allergic reaction to hyoscyamine, other medicines, foods, dyes, or preservatives  ? pregnant or trying to get pregnant  ? breast-feeding  What should I watch for while using this medicine?  You may get dizzy. Do not drive, use machinery, or do anything that needs mental alertness until you know how this medicine affects you. To reduce the risk of dizzy or fainting spells, do not sit or stand up quickly, especially if you are an older patient. Alcohol can make you more dizzy. Avoid alcoholic drinks.  Stay out of bright light and wear sunglasses if this medicine makes your eyes more sensitive to light.  Your mouth may get dry. Chewing sugarless gum or sucking hard candy, and drinking plenty of water may help. Contact your doctor if the problem does not go away or is severe.  This medicine may cause dry eyes and blurred vision. If you wear contact lenses you may feel some discomfort. Lubricating drops may help. See your eye doctor if the problem does not go away or is severe.  Avoid extreme heat (e.g., hot tubs, saunas). This medicine can cause you to sweat less than normal. Your body temperature could increase to dangerous levels, which may lead to heat stroke.  NOTE:This sheet is a summary. It may not cover all possible information. If you have questions about this medicine, talk to your doctor, pharmacist, or health care provider. Copyright? 2018 [employer]            Bladder Diet Information     Least Bothersome Foods Most Bothersome Foods   Fruit   ? Apricots  ? Bananas  ?  Blueberries  ? Dates  ? Melon (honeydew and watermelon)  ? Prunes  ? Pears  ? Raisins ? Cranberry juice  ? Grapefruit and grapefruit juice  ? Lemons  ? Oranges and orange juice  ? Pineapple and pineapple juice  ? Strawberries     Vegetables   ? Avocados  ? Asparagus  ? Beets  ? Broccoli  ? Brussels Sprouts  ? Cabbage  ? Carrots  ? Cauliflower  ? Celery  ? Cucumber  ? Eggplant  ? Mushrooms  ? Peas  ? Potatoes (white potatoes, yams, sweet potatoes)  ? Radishes  ? Spinach  ? Squash  ? Turnips  ? Zucchini ? Chili peppers  ? Rosita Fire  ? Sauerkraut  ? Tomatoes and tomato products     Grains   ? Oats  ? Rice    Proteins   ? Beef  ? Fish (shrimp, tuna fish and salmon)  ? Eggs  ? Nuts  ? Peanut butter  ? Pork  ? Poultry (chicken and [place])  ? Lamb   ? Processed sandwich meats (salami, bologna)  ? Soy     Dairy   ? Milk (low-fat and whole)  ? Cheeses (mild) ? Yogurt     Condiments   ? Herbs  ? Garlic or any herb  infused olive oil ? Chili  ? Horseradish  ? Ketchup  ? Salad Dressings  ? Soy sauce  ? Vinegar  ? Worcester Sauce     Beverages   ? Grain beverages/Coffee substitutes (Cafix?, Pero?, Roma, Postum?)  ? Water   ? Alcohol  ? Coffee (caffeinated and decaffeinated)  ? Tea (caffeinated and decaffeinated)  ? Carbonated drinks  (cola, non-cola, diet, and caffeine-free)   Other Foods   ? Popcorn  ? Pretzels ? Chocolate  ? [place] food  ? [place] food  ? Pizza  ? Spicy foods  ? [place] food   Additives/Artificial Sweeteners    ? Artificial sweeteners (Equal? (sweetener), NutraSweet?, Saccharin, and Sweet?N Low?)  ? Monosodium glutamate (MSG ?     Fingertip Application Method  For Cream    ESTROGEN CREAM APPLICATION:    Wash hands with soap and water. Please draw a line with the estrogen cream from the tip of your index finger to the first line on your finger. Insert your finger into the vagina up until it is comfortable and swirl around. While removing your finger, please spread up and down on external vaginal/ urethral area. This should be used THREE NIGHTS PER WEEK PRIOR TO BEDTIME.  This will only help with improving the quality of vaginal tissue, pain with intercourse and urinary tract prevention if used regularly. It is not absorbed systemically and is not associated with clotting, cancer, stroke, or heart attacks.                1. Wash your hands with soap and water and dry thoroughly.    2. Squeeze tube to express out 1 gram of cream (about enough to cover the tip of your index finger, from the last joint to the finger tip.  (see Figure 1). Please draw a line with the estrogen cream from the tip of your index finger to the first line on your finger.           Figure 1        This instruction sheet is provided to help review the method for applying cream.  Please note that this information is not intended to replace  your practitioner?s instructions: always follow your doctor?s specific directions regarding the use of any prescription medications    3. Locate the vaginal opening (see Figure 2).  Immediately above the vaginal opening is the urethra (a small opening where urine is eliminated from you body).  The urethra may not be as easily identified as the vagina because the opening is much smaller, however, [place] the diagram to determine its approximate location.    4. Insert your finger into the vagina up until it is comfortable and swirl around. While removing your finger, please spread up and down on external vaginal/ urethral area. This should be used THREE NIGHTS PER WEEK PRIOR TO BEDTIME.      5. It is not recommended to use the applicator that comes with the cream. Use only the small finger tip amount as noted above.   6.                       Figure 2            I also wanted to recommend a vaginal moisturizer and vaginal lubricant and review differences of when to use them, in addition to the vaginal estrogen cream.    ?Vaginal moisturizers are intended for use routinely    Recommend using two or three days per week, not just during sexual activity. Many moisturizer products are available in pharmacies and online (eg, Replens, Vagisil Moisturizer, Feminease, Moist Again, K-Y Liquibeads, Hyalo GYN).     Lubricants are used only at the time of sexual activity.     ?Lubricants may be water-based (eg, Astroglide, Slippery Stuff, K-Y Jelly), silicone-based (eg, Pjur, ID Millennium), or oil-based (Elegance Women's Lubricant); oil-based lubricants cause breakdown of latex condoms.

## 2020-08-28 NOTE — Assessment & Plan Note
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

## 2020-08-29 ENCOUNTER — Ambulatory Visit: Admit: 2020-08-28 | Discharge: 2020-08-29 | Payer: MEDICARE

## 2020-09-14 ENCOUNTER — Encounter: Admit: 2020-09-14 | Discharge: 2020-09-14 | Payer: MEDICARE

## 2020-09-14 DIAGNOSIS — E7800 Pure hypercholesterolemia, unspecified: Secondary | ICD-10-CM

## 2020-09-14 DIAGNOSIS — U071 COVID-19: Secondary | ICD-10-CM

## 2020-09-14 DIAGNOSIS — N904 Leukoplakia of vulva: Secondary | ICD-10-CM

## 2020-09-14 DIAGNOSIS — L900 Lichen sclerosus et atrophicus: Secondary | ICD-10-CM

## 2020-09-14 DIAGNOSIS — R600 Localized edema: Secondary | ICD-10-CM

## 2020-09-14 DIAGNOSIS — I2510 Atherosclerotic heart disease of native coronary artery without angina pectoris: Secondary | ICD-10-CM

## 2020-09-14 DIAGNOSIS — R931 Abnormal findings on diagnostic imaging of heart and coronary circulation: Secondary | ICD-10-CM

## 2020-09-14 DIAGNOSIS — Z9289 Personal history of other medical treatment: Secondary | ICD-10-CM

## 2020-09-14 DIAGNOSIS — D72819 Decreased white blood cell count, unspecified: Secondary | ICD-10-CM

## 2020-09-14 DIAGNOSIS — Z9221 Personal history of antineoplastic chemotherapy: Secondary | ICD-10-CM

## 2020-09-14 DIAGNOSIS — I10 Essential (primary) hypertension: Secondary | ICD-10-CM

## 2020-09-14 DIAGNOSIS — N390 Urinary tract infection, site not specified: Secondary | ICD-10-CM

## 2020-09-14 DIAGNOSIS — Z923 Personal history of irradiation: Secondary | ICD-10-CM

## 2020-09-14 DIAGNOSIS — R32 Unspecified urinary incontinence: Secondary | ICD-10-CM

## 2020-09-14 DIAGNOSIS — C50919 Malignant neoplasm of unspecified site of unspecified female breast: Secondary | ICD-10-CM

## 2020-09-14 DIAGNOSIS — D696 Thrombocytopenia, unspecified: Secondary | ICD-10-CM

## 2020-09-14 DIAGNOSIS — E785 Hyperlipidemia, unspecified: Secondary | ICD-10-CM

## 2020-09-14 DIAGNOSIS — K449 Diaphragmatic hernia without obstruction or gangrene: Secondary | ICD-10-CM

## 2020-09-14 DIAGNOSIS — D693 Immune thrombocytopenic purpura: Secondary | ICD-10-CM

## 2020-09-14 DIAGNOSIS — K219 Gastro-esophageal reflux disease without esophagitis: Secondary | ICD-10-CM

## 2020-09-14 DIAGNOSIS — G629 Polyneuropathy, unspecified: Secondary | ICD-10-CM

## 2020-09-14 DIAGNOSIS — R1310 Dysphagia, unspecified: Secondary | ICD-10-CM

## 2020-09-14 DIAGNOSIS — Z0389 Encounter for observation for other suspected diseases and conditions ruled out: Secondary | ICD-10-CM

## 2020-09-14 MED ORDER — FUROSEMIDE 20 MG PO TAB
20 mg | ORAL_TABLET | ORAL | 3 refills | 90.00000 days | Status: AC
Start: 2020-09-14 — End: ?

## 2020-09-14 NOTE — Progress Notes
Date of Service: 09/14/2020    Teresa Trevino is a 65 y.o. female.       HPI     Teresa Trevino was seen in our office today for routine follow-up.  She is a 65 year old female followed in office by Dr. Bobette Mo.  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  She initially presented with an abnormal CT calcium score and a CT coronary angiogram revealed moderate plaque in the LAD and mild disease in her left circumflex.  We therefore prescribed statin to control her cholesterol.  Her cholesterol is not been well controlled with statins and she has an intolerance to high-dose statins, therefore PCSK9 inhibitor (Repatha) has been added to her regimen.  She also has carcinoma of the breast treated with radiation therapy and Taxotere and carboplatin which do not typically cause cardiotoxicity.  An echocardiogram after therapy performed on 07/17/2017 demonstrated normal left-ventricular function and we do not believe we need to be concerned about cardiotoxicity.     She did have an abdominal ultrasound performed on 02/14/2020 which showed no evidence of abdominal aortic aneurysm.    She was last seen in our office on 03/16/2020.  Patient requested reduced dose of Crestor to once a week due to cost.  She was continuing to take Repatha without ill effects.  Her LDL cholesterol in December 2021 was 47 and therefore we recommended that she go ahead and reduce the Crestor 5 mg to once weekly and continue on same  dose of Repatha with a repeat  a lipid profile in 6 months.  In addition she was having some problems with ocular headaches and we recommended she get a carotid ultrasound.    The past, her cholesterol control was very good on statins, however she did not tolerate these drugs due to myalgias and back pain.  Her family doctor arrange for her to have a PCSK9 inhibitor back in December 2020, however she ultimately decided she had had enough injections after her cancer treatment and went back on rosuvastatin 5 mg weekly and has tolerated this dose.  There is also the issue of wait for notification on patient assistance for PCSK9 inhibitor.    In June 2021 we recommend that she get started on Repatha 140 mg every 2 weeks in addition to low-dose rosuvastatin 5 mg  weekly and she was agreeable to this plan.    A carotid ultrasound was performed at [facility] in [place], [place] on 04/27/2020.  The study is normal with no significant carotid artery disease.    She had consultation with Dr. Junita Push, vascular surgery, in May of this year for varicose veins and nocturnal leg cramps. She was advised walk daily fro 20 minutes, do leg stretches prior to bedtime, and  compression stockings as needed.     She is followed by ecology at [place], Dr. Olin Pia, for breast cancer and thrombocytopenia and Dr Isabella Stalling, radiation oncology.  Also followed by urology at [facility] for recurrent UTIs.  She is followed by ophthalmology for ocular headaches.    Today the patient reports no change in symptoms or new  medical/health problems since her last visit.  States she is doing great on the Repatha and low-dose Crestor 5 mg once weekly.  She does tell me that last week she had some abdominal pain.  She saw her PCP, Melissa Huntington, PA and tells me that an abdominal x-ray and ultrasound were done which were unremarkable with no abnormalities.  Laboratories were also checked which were unremarkable, CBC, liver function tests, electrolytes, renal function and amylase and lipase were within normal range.  Laboratory results are outlined below.  Her symptoms resolved after 4-5 days without recurrence.  She also saw urology about 2 weeks ago and tells me her blood pressure was good at that visit.  She denies myalgias also weakness.  She denies having chest pain, tightness or pressure, exertional chest pain, shortness of breath at rest, exertional dyspnea, PND and orthopnea.  She does have persistent intermittent lower extremity edema, worse on the left that is not dependent and does not relieve overnight.  She tells me her PCP is aware of this but wanted to address her abdominal pain first.  Her lower extremity swelling has been ongoing for several months without getting worse.  She denies palpitations, racing heartbeats, dizziness, lightheadedness, near-syncope and syncope.  She drinks a lot of water and also Gatorade 0 or other sports drinks without sugar.  She tells me she is on a no added salt diet but does eat out a lot and is probably getting more hidden salt in her diet than she should.  Her leg cramps are better.  She does still complain of neuropathy and numbness in her feet at night and has been evaluated by vascular surgery at [facility] in May as described above.  Her blood pressure in our office today is 124/76, pulse 84 bpm.  She weighs 212 pounds which is up 4 pounds from her last visit in January of this year.  He walks for about 20 or 30 minutes 2 or 3 days a week.  She is very happy that that she and her husband are building [place] is finished and they are living there permanently, they also have a house at the [place] of the [place].    Her latest lipid profile checked on 09/02/2020 shows a total cholesterol of 131, triglycerides 104, HDL 46, LDL 64 .LDL is at target on current therapy with rosuvastatin 5 mg daily and Repatha 140 mg every 2 weeks..  Liver function test within normal range.     Laboratories checked on 09/04/2020 show sodium 139, potassium 4.2, chloride 104, CO2 28, BUN 14, creatinine 0.8, glucose 96.  Liver function test within normal range with AST 23, ALT 24.  Amylase 44, lipase 2.9, CRP 0.6.  Hemoglobin 13.8, hematocrit 41.8, WBCs 4.8, RBCs 4.08, platelets 139K .  On 09/09/2020 TSH 1.01, vitamin B12 476    Assessment and Plan:  1.  Hypercholesterolemia.  Treated and controlled on current therapy.  Recent lipid profile from 09/02/2020 with LDL 64, total cholesterol 161, triglycerides 104.  LDL at goal on current therapy with Repatha 140 mg every 2 weeks and rosuvastatin 5 mg weekly.       2.  Statin intolerance.  She has developed severe myalgias with  atorvastatin and higher dose rosuvastatin.   She did have an excellent response to high-dose atorvastatin but did not tolerate.  She is tolerating current dose Crestor and Repatha without myalgias or arthralgias other ill effects. Of note is that she did have problems with myalgias when she  was taking generic rosuvastatin but is able to tolerate brand Crestor    3.  Coronary artery disease.  She has mild disease on CT coronary angiogram involving LAD and mild plaque in circumflex.  Normal LV systolic function, EF 60% by echo performed on 07/17/2017.  She is asymptomatic, denies chest pain and angina symptoms.    4.  Lower extremity edema.  Suspect that she may be getting more salt in her diet than she should.  We are going to check an echocardiogram.    5.Breast carcinoma, post chemotherapy and radiation therapy to right chest.  She is 3 years out from breast cancer therapy.  No cardiotoxicity from drugs.  Follows with with breast surgery, oncology and radiation oncology at [facility].    6.  Elevated BMI 311.2.    7. Thrombocytopenia. Followed by oncology at [facility].    8. Recurrent UTIs. Follows with urology at [place].     9.  Ocular migraines. She has follow-up with her ophthalmologist in [place], [place] on 04/06/2020.  Carotid duplex scan performed on 04/27/2020 normal.    10.  Evidence of mild venous insufficiency involving the left great saphenous vein.    Has been evaluated by vascular surgery, Dr. Junita Push at [facility] on 06/23/20.     11.  Nocturnal leg cramps.     12.Marland KitchenPositive family history of abdominal aortic aneurysm.  Abdominal ultrasound performed on 02/14/2020 negative for AAA.    13.  Numbness in legs low the knees and feet at night.  Advised patient to follow-up with PCP to address this problem.    14.  No evidence of carotid artery disease.     -Start furosemide 20 mg every other day.  For worsening swelling she may take an extra dose as needed.  - 2D echo Doppler.  - Increase exercise.  Encourage patient to get in a 30-minute walk at least 5 days a week.  -Encouraged leg stretches prior to bedtime.   -Recommend knee-high compression stockings, on during the day and off at night.  -Risk factors reduction and lifestyle modifications..  -Recommend weight reduction. Suggest Weight Watchers or Mediterranean diet.  Encouraged to lose 10 pounds over the next 3 months.  -Increase exercise.  Encouraged to increase her walking to  a minimum of 30 minutes of walking/moderate aerobic exercise 5 days a week.  Increase duration of exercise to 45 to 60 minutes most days of the week to help with weight loss.  -Advised to follow-up with PCP in regards to her neuropathy and treatment.     Follow-up: Follow-up with Dr. Doristine Counter in 6 months in our [facility] office and follow-up with APP, Mannix Kroeker McIntosh-James in 12 months in our [facility] office..  The patient is encouraged to contact our office if she has problems prior to next visit.     I have educated the patient on the plan of care today. Patient verbally expressed understanding and agreement with the plan. Instructions are outlined in the after visit summary document.      Thank you for the opportunity to participate in this pleasant patient's care. Please don't hesitate to contact me with any concerns or questions.     Total time spent on today's office visit was 55 minutes. This includes face-to-face in person visit with patient as well as nonface-to-face time including review of the EMR, outside records, labs, radiologic studies, echocardiogram & other cardiovascular studies, assessment, formation of treatment plan, follow up plan,  after visit summary, future disposition, personal discussions  with patient. Patient education/counseling greater than 30 minutes including medication instructions, medication interactions, treatment options/risk/benefits, blood pressure monitoring, blood pressure goals, diet/sodium restriction, exercise, weight reduction and follow-up plan and on documentation.  Reviewed outside records at the time of patient's visit today.                                                                      DRB  Vitals:    09/14/20 0854   BP: 124/76   BP Source: Arm, Left Upper   Pulse: 84   SpO2: 97%   O2 Device: None (Room air)   PainSc: Zero   Weight: 96.3 kg (212 lb 6.4 oz)   Height: 172.7 cm (5' 8)     Body mass index is 32.3 kg/m?Marland Kitchen     Past Medical History  Patient Active Problem List    Diagnosis Date Noted   ? Bilateral lower extremity edema 09/14/2020   ? Lichen sclerosus    ? Bilateral leg cramps 06/23/2020   ? Venous insufficiency of left leg 06/23/2020   ? Neuropathy 10/24/2019   ? Pelvic pain 08/08/2019   ? Gastroesophageal reflux disease 05/11/2017   ? Essential hypertension 03/02/2017   ? Thrombocytopenia (HCC) 02/16/2017   ? Coronary artery disease involving native coronary artery of native heart without angina pectoris 01/30/2017     08/2007 Exercise sestamibi [facility] [place].  5.50min o9n TM, no CP. EF 59%, no ischemia  03/17  Ex Echo at [facility] - no ischemia, 6 min on TM, TDS, no CP, no ECG changes, EF normal  06/17  Coronary Artery AJ-130 Score: Total 234.  LAD 206, RCA 28  08/17 CT Cor angiogram at [place] EF 56%, chambers and valves normal, LAD-moderate concentric flat plaque, FFR 0.86; LCx mild plaque, RCA normal.  Thoracic aorta normal.     ? 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 Langen is a caucasian female who presented to the [facility] on 01/25/2017 at age 45 for evaluation of right breast cancer. Ms. Daigneault had no complaints prior to her screening mammogram. A new right breast asymmetry was present on screening mammogram. There are was suspicious on diagnostic imaging and biopsy was recommended. Right breast sono-guided biopsy 01/19/17 ([place]) revealed grade 3 invasive ductal carcinoma. Ms. Armistead underwent right RSL lumpectomy/SLNB on 07/06/17. She finished radiation with Dr. Ardeth Perfect on 10/05/17.    PATHOLOGY:  Tumor:  Size/Extent of Tumor Bed: 1.2 x 0.6 cm   Size/Extent of Residual Invasive Tumor: No invasive tumor identified   Overall Residual Cellularity of the Tumor Bed: <1%   1.5 mm DCIS present  Margins Free From Tumor:  Yes  ER:  negative   PR:  negative    Her 2:  negative  Grade:  3  Lymph Nodes:  0/4  LVSI:  no  Extranodal extension:  no      BREAST IMAGING:  Mammogram:    -- Bilateral screening mammogram 12/20/16 ([place]) revealed scattered fibroglandular densities. There was a focal asymmetry present within the central posterior right  breast. Additional diagnostic images and ultrasound were recommended. No abnormalities were present on the left breast.  -- Right diagnostic mammogram 01/04/17 ([place]) revealed an approximately 1.2 cm x 0.6 cm x 0.5 cm low-density mass at 4:00 with associated calcifications. This was considered suspicious and further evaluation with ultrasound will be performed.    Ultrasound:    -- Targeted right breast ultrasound 01/04/17 ([place]) revealed at 4:00 there was an irregular hypoechoic mass measuring approximately 6 mm x 9 mm x 8 mm with associated microcalcifications. This was thought to likely correlate with the mammographic abnormality and was considered somewhat suspicious. Ultrasound-guided biopsy was recommended. If the clip marker form sono guided biopsy of this mass did not correlate to the mammographic finding, additional stereotactic biopsy might be required. These findings recommendations were discussed in detail with the patient at the time of today's procedure.     REPRODUCTIVE HEALTH:  Age at first Menarche: Unknown   Age at Menopause:  Hysterectomy/BSO at 16, HRT cream for several months    PROCEDURE: Right RSL lumpectomy/SLNB, 07/06/17  PERTINENT PMH:  HTN, chronic leukopenia  FAMILY HISTORY:  Maternal Grandmother- Breast cancer dx age late 65s  PHYSICAL EXAM on PRESENTATION: Right - No palpable breast masses. No skin, nipple, or areolar change. Left - No palpable breast masses. No skin, nipple, or areolar change. No supraclavicular or axillary adenopathy.   MEDICAL ONCOLOGY:  Dr. Olin Pia NEOADJUVANT THERAPY: Taxotere/carbo completed 06/01/17  REFERRED BY:  Efraim Kaufmann Huntington PA       ? Nocturia 01/2017   ? Post-void dribbling 01/2017   ? Lichen sclerosus et atrophicus of the vulva 09/22/2015   ? Agatston coronary artery calcium score between 200 and 399 08/21/2015   ? Hypercholesterolemia 08/20/2015   ? Vulvar lesion 03/12/2015   ? Recurrent UTI 09/11/2014   ? Dyspareunia, female 05/13/2013   ? Vaginal atrophy 05/13/2013   ? Urge incontinence of urine 05/13/2013   ? Mixed incontinence urge and stress (female)(female) 10/19/2011         Review of Systems   Constitutional: Negative.   HENT: Negative.    Eyes: Negative.    Cardiovascular: Positive for leg swelling.   Respiratory: Negative.    Endocrine: Negative.    Hematologic/Lymphatic: Negative.    Skin: Negative.    Musculoskeletal: Negative.    Gastrointestinal: Negative.    Genitourinary: Negative.    Neurological: Negative.    Psychiatric/Behavioral: Negative.    Allergic/Immunologic: Negative.        Physical Exam  Vital signs were reviewed.   General Appearance:appears well nourished, elevated BMI, appears relaxed, in no acute distress,wearing a face mask  Skin: warm, moist, intact, no rash or lesions, no xanthomas  HEENT: unremarkable, pupils equal and round, no scleral icterus, conjunctivae and lids normal  Lips & Mouth: no pallor or cyanosis  Neck Veins: JVP normal,.  neck veins are flat, neck veins are not distended   Carotid Arteries: normal carotid upstroke bilaterally, no bruits bilaterally  Chest Inspection: chest is normal in appearance  Auscultation/Percussion/Effort: lungs clear to auscultation, no rales, rhonchi, or wheezing, respirations even and unlabored, no respiratory distress  Cardiac Rhythm: regular rhythm and normal rate   Cardiac Auscultation: normal S1 & S2, no S3 or S4, no rub   Murmurs: no cardiac murmurs   Lower extremities:trace RLE and 1+ LLE edema concentrated mostly to foot and ankles., 2+ symmetric distal pulses   Abdominal Exam: soft, non-tender,non-distended, no obvious masses, bowel sounds normal, no guarding  Liver & Spleen: no organomegaly   Neurologic Exam: grossly intact, alert, moves all extremities equally   Orientation: oriented to time, person and place, clear historian  Gait: normal, steady, walks without assistance  Language & Memory: speech clear, patient responsive, seems to comprehend information            Cardiovascular Health Factors  Vitals BP Readings from Last 3 Encounters:   09/14/20 124/76   08/28/20 125/82   06/23/20 (!) 145/102     Wt Readings from Last 3 Encounters:   09/14/20 96.3 kg (212 lb 6.4 oz)   06/23/20 93.9 kg (207 lb)   05/11/20 95 kg (209 lb 6.4 oz)     BMI Readings from Last 3 Encounters:   09/14/20 32.30 kg/m?   06/23/20 31.47 kg/m?   05/11/20 31.84 kg/m?      Smoking Social History     Tobacco Use   Smoking Status Never Smoker   Smokeless Tobacco Never Used      Lipid Profile Cholesterol   Date Value Ref Range Status   02/04/2020 103  Final     HDL   Date Value Ref Range Status   02/04/2020 41  Final     LDL   Date Value Ref Range Status   02/04/2020 47  Final     Triglycerides   Date Value Ref Range Status   02/04/2020 76  Final      Blood Sugar No results found for: HGBA1C  Glucose   Date Value Ref Range Status   02/04/2020 95  Final   06/18/2019 103  Final   07/10/2018 98  Final          Problems Addressed Today  Encounter Diagnoses   Name Primary?   ? Agatston coronary artery calcium score between 200 and 399 Yes   ? Coronary artery disease involving native coronary artery of native heart without angina pectoris    ? Essential hypertension    ? Hypercholesterolemia    ? Thrombocytopenia (HCC)    ? Bilateral lower extremity edema    ? Neuropathy                      Current Medications (including today's revisions)  ? cholecalciferol (vitamin D3) (OPTIMAL D3) 50,000 units capsule TAKE ONE CAPSULE BY MOUTH EVERY WEEK   ? clobetasoL (TEMOVATE) 0.05 % topical ointment Apply BID x 2-4 weeks and taper to TIW prn   ? estradioL (ESTRACE) 0.01 % (0.1 mg/g) vaginal cream Apply 0.5 gram to the urethral opening at night for 2 weeks then twice weekly   ? evolocumab (REPATHA SYRINGE) 140 mg/mL injectable SYRINGE Inject 1 mL under the skin every 14 days.   ? furosemide (LASIX) 20 mg tablet Take one tablet by mouth every 48 hours.   ? hyoscyamine sulfate (LEVSIN/SL) 0.125 mg sublingual tablet Place one tablet under tongue every 4 hours as needed for Other... (Bladder spasms, urgency, pelvic pain). Max 1.5 mg/ day   ? losartan (COZAAR) 50 mg tablet Take 50 mg by mouth daily.   ? omeprazole DR (PRILOSEC) 20 mg capsule Take one capsule by mouth daily before breakfast.   ? phenazopyridine (PYRIDIUM) 200 mg tablet Take one tablet by mouth three times daily as needed for Pain. Max 3 days.   ? rosuvastatin (CRESTOR) 5 mg tablet Take one tablet by mouth every 7 days.

## 2020-09-16 ENCOUNTER — Ambulatory Visit: Admit: 2020-09-16 | Discharge: 2020-09-16 | Payer: MEDICARE

## 2020-09-16 ENCOUNTER — Encounter: Admit: 2020-09-16 | Discharge: 2020-09-16 | Payer: MEDICARE

## 2020-09-16 DIAGNOSIS — I10 Essential (primary) hypertension: Secondary | ICD-10-CM

## 2020-09-16 DIAGNOSIS — E7800 Pure hypercholesterolemia, unspecified: Secondary | ICD-10-CM

## 2020-09-16 DIAGNOSIS — R600 Localized edema: Secondary | ICD-10-CM

## 2020-09-16 NOTE — Telephone Encounter
-----   Message from [place], APRN-NP sent at 09/16/2020  3:03 PM CDT -----  Nurses, please let the patient know that her echo done today 09/17/31 is unremarkable and reassuring. LVEF 60%.  Normal RV size and systolic function.  Normal biatrial size.  No significant valvular abnormalities, only mild MR--not concerning. .  Normal pulmonary  artery pressure.  Result is good news.   Thanks. GM

## 2020-09-16 NOTE — Telephone Encounter
Discussed results with patient. Patient does not have any further questions at this time.

## 2020-09-22 ENCOUNTER — Encounter: Admit: 2020-09-22 | Discharge: 2020-09-22 | Payer: MEDICARE

## 2020-10-07 ENCOUNTER — Encounter: Admit: 2020-10-07 | Discharge: 2020-10-07 | Payer: MEDICARE

## 2020-10-07 MED ORDER — CLOBETASOL 0.05 % TP OINT
Freq: Two times a day (BID) | OPHTHALMIC | 0 refills | 30.00000 days | Status: AC | PRN
Start: 2020-10-07 — End: ?

## 2020-10-27 ENCOUNTER — Encounter: Admit: 2020-10-27 | Discharge: 2020-10-27 | Payer: MEDICARE

## 2020-10-27 ENCOUNTER — Ambulatory Visit: Admit: 2020-10-27 | Discharge: 2020-10-27 | Payer: MEDICARE

## 2020-10-27 DIAGNOSIS — Z9221 Personal history of antineoplastic chemotherapy: Secondary | ICD-10-CM

## 2020-10-27 DIAGNOSIS — Z0389 Encounter for observation for other suspected diseases and conditions ruled out: Secondary | ICD-10-CM

## 2020-10-27 DIAGNOSIS — C50311 Malignant neoplasm of lower-inner quadrant of right female breast: Secondary | ICD-10-CM

## 2020-10-27 DIAGNOSIS — R32 Unspecified urinary incontinence: Secondary | ICD-10-CM

## 2020-10-27 DIAGNOSIS — R1310 Dysphagia, unspecified: Secondary | ICD-10-CM

## 2020-10-27 DIAGNOSIS — Z9289 Personal history of other medical treatment: Secondary | ICD-10-CM

## 2020-10-27 DIAGNOSIS — C50919 Malignant neoplasm of unspecified site of unspecified female breast: Secondary | ICD-10-CM

## 2020-10-27 DIAGNOSIS — I10 Essential (primary) hypertension: Secondary | ICD-10-CM

## 2020-10-27 DIAGNOSIS — K449 Diaphragmatic hernia without obstruction or gangrene: Secondary | ICD-10-CM

## 2020-10-27 DIAGNOSIS — L900 Lichen sclerosus et atrophicus: Secondary | ICD-10-CM

## 2020-10-27 DIAGNOSIS — Z923 Personal history of irradiation: Secondary | ICD-10-CM

## 2020-10-27 DIAGNOSIS — I2510 Atherosclerotic heart disease of native coronary artery without angina pectoris: Secondary | ICD-10-CM

## 2020-10-27 DIAGNOSIS — D72819 Decreased white blood cell count, unspecified: Secondary | ICD-10-CM

## 2020-10-27 DIAGNOSIS — K219 Gastro-esophageal reflux disease without esophagitis: Secondary | ICD-10-CM

## 2020-10-27 DIAGNOSIS — U071 COVID-19: Secondary | ICD-10-CM

## 2020-10-27 DIAGNOSIS — N904 Leukoplakia of vulva: Secondary | ICD-10-CM

## 2020-10-27 DIAGNOSIS — E785 Hyperlipidemia, unspecified: Secondary | ICD-10-CM

## 2020-10-27 DIAGNOSIS — D693 Immune thrombocytopenic purpura: Secondary | ICD-10-CM

## 2020-10-27 DIAGNOSIS — N390 Urinary tract infection, site not specified: Secondary | ICD-10-CM

## 2020-10-27 NOTE — Progress Notes
Radiation Oncology Follow Up Note  Date: 10/27/2020       Teresa Trevino is a 65 y.o. female.     The encounter diagnosis was Malignant neoplasm of lower-inner quadrant of right breast of female, estrogen receptor negative (HCC).  Staging: Cancer Staging  Malignant neoplasm of lower-inner quadrant of right breast of female, estrogen receptor negative (HCC)  Staging form: Breast, AJCC 8th Edition  - Clinical stage from 01/19/2017: Stage IB (cT1b, cN0, cM0, G3, ER-, PR-, HER2-) - Signed by Dimas Alexandria, PA-C on 01/24/2017        History of Present Illness  65 year-old female with triple negative right breast cancer, stage IB (T1B N0 M0), status post neoadjuvant carboplatin/docetaxel and lumpectomy 06/2017 with complete response. She completed adjuvant radiation to the right breast to a total of 5,005 cGy in 20 fractions between 09/07/2017 and 10/05/2017.     Review of Systems   Constitutional: Negative.    HENT: Negative.    Eyes: Negative.    Respiratory: Negative.    Cardiovascular: Negative.    Gastrointestinal: Negative.    Endocrine: Negative.    Genitourinary: Negative.    Musculoskeletal: Negative.    Skin: Negative.    Allergic/Immunologic: Negative.    Neurological: Negative.    Hematological: Negative.    Psychiatric/Behavioral: Negative.    All other systems reviewed and are negative.        Subjective:          Cancer Staging  Malignant neoplasm of lower-inner quadrant of right breast of female, estrogen receptor negative (HCC)  Staging form: Breast, AJCC 8th Edition  - Clinical stage from 01/19/2017: Stage IB (cT1b, cN0, cM0, G3, ER-, PR-, HER2-) - Signed by Dimas Alexandria, PA-C on 01/24/2017  10/27/2020  Teresa Trevino is seen today for routine follow-up.  On evaluation today, she reports that she is doing well.  She denies any headaches, dizziness, nausea, vomiting, chest pain, shortness of breath, cough, sputum changes, fevers, chills, night sweats.  She denies any breast pain or breast tenderness. She states that she is no longer really experiencing any twinges in her breast anymore either or they occur very rarely.  She reports that her right breast is stable in size since symmetry slightly smaller relative to the left breast.  But she is happy with the cosmetic appearance.  No new issues or concerns to report.      01/22/2020  Teresa Trevino is seen today for routine follow-up.  She was last in the clinic June 2021.  Evaluation today, she reports she is doing well.  She denies any headaches, dizziness, nausea, vomiting, chest pain, shortness of breath or cough.  She denies any lumps or bumps within the breast bilaterally, no unusual rashes.  No nipple discharge or retraction.  She reports occasional pain in the right rib cage particularly if she is sitting in the car a certain way.  She thinks perhaps the seatbelt aggravates it.  Otherwise she feels like the breasts are fairly symmetric, no difficulty with range of motion.    08/08/2019  Teresa Trevino is seen today for routine follow-up.  On evaluation she reports that she is doing well.  Since her last visit she has had her mammogram which was performed in February 2021 and showed BI-RADS 2 findings.  She denies headaches, dizziness, nausea, vomiting, chest pain, shortness of breath or cough.  She denies any fevers chills or night sweats.  She denies breast pain, breast tenderness although occasionally she will  have some discomfort on the right side when wearing her seatbelt.  She denies any masses within the breasts, nipple discharge or nipple retraction.  She denies any swelling or difficulty with range of motion.  All other review of systems are negative.    03/15/2019  Teresa Trevino returns today for followup. She was last seen in our clinic on November 14, 2018. On evaluation today she reports she is doing well.  She has an upcoming appointment with Dr. Freida Busman in April, 2021. She is scheduled to have her next mammogram at [facility] in February, 2021, and she will be seen for follow-up in her surgeons clinic.  She currently denies any headaches or dizziness, nausea or vomiting, chest pain, shortness of breath, cough, sputum changes, fevers, chills, night sweats.  She denies any breast pain or breast tenderness.  She reports she thinks the right breast is continuing to become slightly smaller relative to the left.  She says sometimes the medial aspect and underside of her breast feels a little bit more dense but that it is not always the case.    Objective:         ? cholecalciferol (vitamin D3) (OPTIMAL D3) 50,000 units capsule TAKE ONE CAPSULE BY MOUTH EVERY WEEK   ? clobetasoL (TEMOVATE) 0.05 % topical ointment Apply BID x 2-4 weeks and taper to TIW prn   ? estradioL (ESTRACE) 0.01 % (0.1 mg/g) vaginal cream Apply 0.5 gram to the urethral opening at night for 2 weeks then twice weekly   ? evolocumab (REPATHA SYRINGE) 140 mg/mL injectable SYRINGE Inject 1 mL under the skin every 14 days.   ? furosemide (LASIX) 20 mg tablet Take one tablet by mouth every 48 hours.   ? hyoscyamine sulfate (LEVSIN/SL) 0.125 mg sublingual tablet Place one tablet under tongue every 4 hours as needed for Other... (Bladder spasms, urgency, pelvic pain). Max 1.5 mg/ day   ? losartan (COZAAR) 50 mg tablet Take 50 mg by mouth daily.   ? omeprazole DR (PRILOSEC) 20 mg capsule Take one capsule by mouth daily before breakfast.   ? phenazopyridine (PYRIDIUM) 200 mg tablet Take one tablet by mouth three times daily as needed for Pain. Max 3 days.   ? rosuvastatin (CRESTOR) 5 mg tablet Take one tablet by mouth every 7 days.     Vitals:    10/27/20 1051   BP: (!) 142/81   Pulse: 80   Temp: 36.6 ?C (97.9 ?F)   Resp: 18   SpO2: 100%   PainSc: Zero   Weight: 99.2 kg (218 lb 12.8 oz)     Body mass index is 33.27 kg/m?Marland Kitchen     Pain Score: Zero        Fatigue Scale: 0-None    KARNOFSKY PERFORMANCE SCORE:  100% Normal, no complaints     Physical Exam  Vitals and nursing note reviewed.   Constitutional: General: She is not in acute distress.     Appearance: Normal appearance. She is well-developed. She is not ill-appearing.   HENT:      Head: Normocephalic and atraumatic.   Eyes:      Extraocular Movements: Extraocular movements intact.      Pupils: Pupils are equal, round, and reactive to light.   Cardiovascular:      Rate and Rhythm: Normal rate and regular rhythm.      Pulses: Normal pulses.      Heart sounds: Normal heart sounds.   Pulmonary:      Effort: Pulmonary effort  is normal. No respiratory distress.      Breath sounds: Normal breath sounds. No wheezing.   Chest:   Breasts:      Right: No mass or tenderness.      Left: Normal. No mass or tenderness.        Comments: Trace dependent edema noted in the right breast.  Overall symmetry is good with the right breast slightly smaller in size relative to the left.  Musculoskeletal:         General: Normal range of motion.      Cervical back: Normal range of motion. No rigidity.   Lymphadenopathy:      Cervical: No cervical adenopathy.      Upper Body:      Right upper body: No supraclavicular, axillary or pectoral adenopathy.      Left upper body: No supraclavicular, axillary or pectoral adenopathy.   Skin:     General: Skin is warm and dry.   Neurological:      General: No focal deficit present.      Mental Status: She is alert and oriented to person, place, and time. Mental status is at baseline.      Cranial Nerves: No cranial nerve deficit.   Psychiatric:         Mood and Affect: Mood normal.         Behavior: Behavior is cooperative.         Thought Content: Thought content normal.         Judgment: Judgment normal.            Imaging:    2D + DOPPLER ECHO  Normal LV size and systolic function with an estimated ejection fraction   of 60%  Normal RV size and function  The atria are normal in size  Normal valve morphology  Mild mitral regurgitation is present  Nondilated IVC  Normal estimated PA systolic pressure         Assessment and Plan:  65 year-old female with triple negative right breast cancer, stage IB (T1B N0 M0), status post neoadjuvant carboplatin/docetaxel and lumpectomy 06/2017 with complete response. She completed adjuvant radiation to the right breast to a total of 5,005 cGy in 20 fractions between 09/07/2017 and 10/05/2017.    -Mrs. Fuertes is seen today for routine follow-up.  She is clinically stable at this time without clinical evidence of disease recurrence or progression.  She has good cosmesis and trace dependent edema.  We discussed massage techniques and interventions to help move the fluid out of the dependent portion of the breast.  - She will keep her upcoming appointment with Dr. Olin Pia in December 2022.  Her next mammogram is due in February 2023.  - I will plan to see her back in approximately 6 months for continued surveillance.  - She will keep her appointments with her other managing physicians.  - She may contact us in the interim if there are any questions or concerns requiring earlier assessment.      Elby Showers, MD    Parts of this note were created using voice recognition software.  Please excuse any grammatical or typographical errors.    I spent a total of 15 minutes today in direct patient evaluation with more than 50% spent in treatment plan formulation, counseling, symptom management discussion and consultation as outlined above.

## 2020-11-24 ENCOUNTER — Encounter: Admit: 2020-11-24 | Discharge: 2020-11-24 | Payer: MEDICARE

## 2020-12-15 ENCOUNTER — Encounter: Admit: 2020-12-15 | Discharge: 2020-12-15 | Payer: MEDICARE

## 2020-12-15 DIAGNOSIS — N390 Urinary tract infection, site not specified: Secondary | ICD-10-CM

## 2020-12-15 DIAGNOSIS — Z923 Personal history of irradiation: Secondary | ICD-10-CM

## 2020-12-15 DIAGNOSIS — C50919 Malignant neoplasm of unspecified site of unspecified female breast: Secondary | ICD-10-CM

## 2020-12-15 DIAGNOSIS — R32 Unspecified urinary incontinence: Secondary | ICD-10-CM

## 2020-12-15 DIAGNOSIS — D693 Immune thrombocytopenic purpura: Secondary | ICD-10-CM

## 2020-12-15 DIAGNOSIS — K219 Gastro-esophageal reflux disease without esophagitis: Secondary | ICD-10-CM

## 2020-12-15 DIAGNOSIS — N904 Leukoplakia of vulva: Secondary | ICD-10-CM

## 2020-12-15 DIAGNOSIS — I10 Essential (primary) hypertension: Secondary | ICD-10-CM

## 2020-12-15 DIAGNOSIS — R1310 Dysphagia, unspecified: Secondary | ICD-10-CM

## 2020-12-15 DIAGNOSIS — E785 Hyperlipidemia, unspecified: Principal | ICD-10-CM

## 2020-12-15 DIAGNOSIS — U071 COVID-19: Secondary | ICD-10-CM

## 2020-12-15 DIAGNOSIS — K449 Diaphragmatic hernia without obstruction or gangrene: Secondary | ICD-10-CM

## 2020-12-15 DIAGNOSIS — Z9221 Personal history of antineoplastic chemotherapy: Secondary | ICD-10-CM

## 2020-12-15 DIAGNOSIS — D72819 Decreased white blood cell count, unspecified: Secondary | ICD-10-CM

## 2020-12-15 DIAGNOSIS — I2510 Atherosclerotic heart disease of native coronary artery without angina pectoris: Secondary | ICD-10-CM

## 2020-12-15 DIAGNOSIS — L900 Lichen sclerosus et atrophicus: Secondary | ICD-10-CM

## 2020-12-15 DIAGNOSIS — Z9289 Personal history of other medical treatment: Secondary | ICD-10-CM

## 2020-12-15 DIAGNOSIS — Z0389 Encounter for observation for other suspected diseases and conditions ruled out: Secondary | ICD-10-CM

## 2020-12-16 ENCOUNTER — Encounter: Admit: 2020-12-16 | Discharge: 2020-12-16 | Payer: MEDICARE

## 2020-12-16 MED ORDER — OMEPRAZOLE 20 MG PO CPDR
20 mg | ORAL_CAPSULE | Freq: Every day | ORAL | 1 refills | Status: AC
Start: 2020-12-16 — End: ?

## 2020-12-16 NOTE — Telephone Encounter
Faxed refill request received for Omeprazole. Message sent to pt to make appt as she has not been seen since 12/20. Omeprazole 20 mg every day Rx sent to Dr. Ebony Hail for approval while awaiting appt.

## 2021-01-15 ENCOUNTER — Encounter: Admit: 2021-01-15 | Discharge: 2021-01-15 | Payer: MEDICARE

## 2021-01-15 MED ORDER — OMEPRAZOLE 20 MG PO CPDR
20 mg | ORAL_CAPSULE | Freq: Every day | ORAL | 1 refills | Status: AC
Start: 2021-01-15 — End: ?

## 2021-01-18 ENCOUNTER — Encounter: Admit: 2021-01-18 | Discharge: 2021-01-18 | Payer: MEDICARE

## 2021-02-06 ENCOUNTER — Encounter: Admit: 2021-02-06 | Discharge: 2021-02-06 | Payer: MEDICARE

## 2021-02-06 MED ORDER — OMEPRAZOLE 20 MG PO CPDR
20 mg | ORAL_CAPSULE | Freq: Every day | ORAL | 1 refills
Start: 2021-02-06 — End: ?

## 2021-02-09 ENCOUNTER — Ambulatory Visit: Admit: 2021-02-09 | Discharge: 2021-02-09 | Payer: MEDICARE

## 2021-02-09 ENCOUNTER — Encounter: Admit: 2021-02-09 | Discharge: 2021-02-09 | Payer: MEDICARE

## 2021-02-09 DIAGNOSIS — N904 Leukoplakia of vulva: Secondary | ICD-10-CM

## 2021-02-09 DIAGNOSIS — I10 Essential (primary) hypertension: Secondary | ICD-10-CM

## 2021-02-09 DIAGNOSIS — N390 Urinary tract infection, site not specified: Secondary | ICD-10-CM

## 2021-02-09 DIAGNOSIS — R32 Unspecified urinary incontinence: Secondary | ICD-10-CM

## 2021-02-09 DIAGNOSIS — E785 Hyperlipidemia, unspecified: Secondary | ICD-10-CM

## 2021-02-09 DIAGNOSIS — L900 Lichen sclerosus et atrophicus: Secondary | ICD-10-CM

## 2021-02-09 DIAGNOSIS — R1310 Dysphagia, unspecified: Secondary | ICD-10-CM

## 2021-02-09 DIAGNOSIS — D72819 Decreased white blood cell count, unspecified: Secondary | ICD-10-CM

## 2021-02-09 DIAGNOSIS — Z9221 Personal history of antineoplastic chemotherapy: Secondary | ICD-10-CM

## 2021-02-09 DIAGNOSIS — C50919 Malignant neoplasm of unspecified site of unspecified female breast: Secondary | ICD-10-CM

## 2021-02-09 DIAGNOSIS — Z0389 Encounter for observation for other suspected diseases and conditions ruled out: Secondary | ICD-10-CM

## 2021-02-09 DIAGNOSIS — D693 Immune thrombocytopenic purpura: Secondary | ICD-10-CM

## 2021-02-09 DIAGNOSIS — Z923 Personal history of irradiation: Secondary | ICD-10-CM

## 2021-02-09 DIAGNOSIS — I2510 Atherosclerotic heart disease of native coronary artery without angina pectoris: Secondary | ICD-10-CM

## 2021-02-09 DIAGNOSIS — K219 Gastro-esophageal reflux disease without esophagitis: Secondary | ICD-10-CM

## 2021-02-09 DIAGNOSIS — U071 COVID-19: Secondary | ICD-10-CM

## 2021-02-09 DIAGNOSIS — K449 Diaphragmatic hernia without obstruction or gangrene: Secondary | ICD-10-CM

## 2021-02-09 DIAGNOSIS — Z9289 Personal history of other medical treatment: Secondary | ICD-10-CM

## 2021-02-09 MED ORDER — CLOBETASOL 0.05 % TP OINT
Freq: Two times a day (BID) | OPHTHALMIC | 1 refills | 30.00000 days | Status: AC | PRN
Start: 2021-02-09 — End: ?

## 2021-02-10 NOTE — Progress Notes
Teresa Trevino is a 65 y.o. G3P1 here today for Skin Problem  .     Subjective:  Pt is here for skin check. She is doing well today, but needs refill of clobetasol. Using it in maintenance dosing.     OB History   Gravida Para Term Preterm AB Living   3 1       1    SAB IAB Ectopic Multiple Live Births           1      # Outcome Date GA Lbr Len/2nd Weight Sex Delivery Anes PTL Lv   3 Gravida            2 Gravida            1 Para                No LMP recorded (lmp unknown). Patient has had a hysterectomy.    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  No exercise induced chest pain.  2.  No ECG evidence of ischemia.  3.  No echocardiographic evidence of ischemia.  4.  Normal  Hemodynamic response to exercise.  5.  No significant exercise induced arrhythmias.  6.  Low risk scan.  7.  Consider medical management if indicated.), and Urinary tract infection.    She has a past surgical history that includes hysterectomy (2008); colonoscopy; Foot surgery; ct angiogram (non-invasive) (09/2015); Upper gastrointestinal endoscopy (05/26/2017); tonsillectomy (1977); Mastectomy, partial (Right, 07/06/2017) (RIGHT RADIOACTIVE SEED LOCALIZED LUMPECTOMY performed by Ruthy Dick, DO at [facility] OR/PERIOP); lymph node biopsy (Right, 07/06/2017) (SENTINEL LYMPH NODE BIOPSY performed by Ruthy Dick, DO at [facility] OR/PERIOP); Colonoscopy (N/A, 02/01/2018) (COLONOSCOPY DIAGNOSTIC WITH SPECIMEN COLLECTION BY BRUSHING/ WASHING - FLEXIBLE performed by Virgina Organ, MD at [facility] OR/PERIOP); Upper gastrointestinal endoscopy (N/A, 02/01/2018) (ESOPHAGOGASTRODUODENOSCOPY WITH BIOPSY - FLEXIBLE performed by Virgina Organ, MD at [facility] OR/PERIOP); Bladder surgery (2013) (bladder sling); and bladder repair (2008) [facility]).    Teresa Trevino's family history includes Cancer in her maternal grandmother; Diabetes in her father, maternal grandfather, mother, and paternal uncle; Heart Failure in her father; High Cholesterol in her mother; Hypertension in her mother; Kidney Failure in her mother; None Reported in her sister; Stroke in her mother; Thyroid Disease in her mother.      Allergies  Bactrim [sulfamethoxazole-trimethoprim], Erythromycin, Macrobid [nitrofurantoin monohyd/m-cryst], and Sulfa (sulfonamide antibiotics)     ROS  No Fever    Objective:    Vitals:    02/09/21 1313   BP: 136/78   Pulse: 80   Temp: 36.3 ?C (97.3 ?F)   Resp: 16   SpO2: 100%       OBGyn Exam  Vitals and nursing note reviewed. Exam conducted with a chaperone present.   Constitutional:       General: Pt is not in acute distress.     Appearance: Normal appearance. Pt is well-developed. Pt is not ill-appearing, toxic-appearing or diaphoretic.   HENT:  Head: Normocephalic and atraumatic.   Cardiovascular:      Rate and Rhythm: Normal rate.   Pulmonary:      Effort: Pulmonary effort is normal.   Genitourinary:     General: No lichenification, ulcerations, erosions, masses, discrete lesions noted.     Neurological:      Mental Status: She is alert and oriented to person, place, and time.   Psychiatric:         Mood and Affect: Mood normal.         Speech: Speech normal.        Assessment/Plan:    Impression is lichen sclerosus without active flare. Refilled clobetasol, RTC in 6 months for skin check with Dr Garlan Fair.     Halford Decamp was seen today for skin problem.    Diagnoses and all orders for this visit:    Lichen sclerosus    Other orders  -     clobetasoL (TEMOVATE) 0.05 % topical ointment; Apply BID x 2-4 weeks and taper to TIW prn      Total Time Today was 30 minutes in the following activities: Preparing to see the patient, Obtaining and/or reviewing separately obtained history, Performing a medically appropriate examination and/or evaluation, Counseling and educating the patient/family/caregiver, Ordering medications, tests, or procedures and Documenting clinical information in the electronic or other health record      Ligia Duguay Melany Guernsey, APRN  Dr. Tami Ribas, collaborator

## 2021-02-16 ENCOUNTER — Encounter: Admit: 2021-02-16 | Discharge: 2021-02-16 | Payer: MEDICARE

## 2021-02-16 ENCOUNTER — Ambulatory Visit: Admit: 2021-02-16 | Discharge: 2021-02-17 | Payer: MEDICARE

## 2021-02-16 DIAGNOSIS — R159 Full incontinence of feces: Secondary | ICD-10-CM

## 2021-02-16 DIAGNOSIS — K602 Anal fissure, unspecified: Secondary | ICD-10-CM

## 2021-02-16 DIAGNOSIS — K219 Gastro-esophageal reflux disease without esophagitis: Secondary | ICD-10-CM

## 2021-02-16 DIAGNOSIS — R151 Fecal smearing: Secondary | ICD-10-CM

## 2021-02-16 MED ORDER — METAMUCIL (WITH SUGAR) 3.4 GRAM PO PWPK
1 | Freq: Two times a day (BID) | ORAL | 3 refills | Status: AC
Start: 2021-02-16 — End: ?

## 2021-02-16 MED ORDER — OMEPRAZOLE 20 MG PO CPDR
20 mg | ORAL_CAPSULE | Freq: Every day | ORAL | 3 refills | Status: AC
Start: 2021-02-16 — End: ?

## 2021-02-16 NOTE — Progress Notes
Telehealth Visit Note    Date of Service: 02/16/2021    Subjective:      Obtained patient's verbal consent to treat them and their agreement to [facility] financial policy and NPP via this telehealth visit during the [facility] Emergency       Teresa Trevino is a 66 y.o. female.    History of Present Illness      Teresa Trevino is a pleasant 65 y.o. female patient of Dr. Wynonia Lawman with history of breast cancer, fecal incontinence, variable bowel movement and heartburn.     She presents today for follow up. She began noticing rectal pain and bleeding about a week ago. Bleeding was minimal, just a few spots on the TP. No blood in stool. She thought this was a hemorrhoid but she noticed a small tear. She has been using moist wipes to clean and hydrocortisone cream. She feels like fissure is resolving. She does report some fecal incontinence. She does not feel constipated but does note small stool pebbles that she passes unintentionally when she urinates. She states these pebbles are not hard. She does have to wipe a lot to get completely clean. She does report having anorectal manometry completed years ago. She also has history of hysterectomy. She has had 2 episode of fecal incontinence that resulted in soiling her underwear over the past year. However, almost daily she will unintentionally pass stool when she urinates. She denies fecal urgency but does endorse urinary urgency. She follows with urology but feels like they are missing something with her urinary concerns. She has also had multiple UTIs over the past year. She uses Metamucil, 1 packet daily.     She remains on omeprazole 20mg  daily and this works well for heartburn.              Review of Systems   Constitutional: Negative.    HENT: Negative.    Eyes: Negative.    Respiratory: Negative.    Cardiovascular: Negative.    Gastrointestinal: Positive for anal bleeding, constipation and diarrhea.   Endocrine: Negative.    Genitourinary: Negative. Musculoskeletal: Negative.    Skin: Negative.    Allergic/Immunologic: Negative.    Neurological: Negative.    Hematological: Negative.    Psychiatric/Behavioral: Negative.          Objective:         ? cholecalciferol (vitamin D3) (OPTIMAL D3) 50,000 units capsule TAKE ONE CAPSULE BY MOUTH EVERY WEEK   ? clobetasoL (TEMOVATE) 0.05 % topical ointment Apply BID x 2-4 weeks and taper to TIW prn   ? estradioL (ESTRACE) 0.01 % (0.1 mg/g) vaginal cream Apply 0.5 gram to the urethral opening at night for 2 weeks then twice weekly   ? evolocumab (REPATHA SYRINGE) 140 mg/mL injectable SYRINGE Inject 1 mL under the skin every 14 days.   ? furosemide (LASIX) 20 mg tablet Take one tablet by mouth every 48 hours.   ? hyoscyamine sulfate (LEVSIN/SL) 0.125 mg sublingual tablet Place one tablet under tongue every 4 hours as needed for Other... (Bladder spasms, urgency, pelvic pain). Max 1.5 mg/ day   ? losartan (COZAAR) 50 mg tablet Take 50 mg by mouth daily.   ? omeprazole DR (PRILOSEC) 20 mg capsule TAKE ONE CAPSULE BY MOUTH DAILY BEFORE BREAKFAST.   ? phenazopyridine (PYRIDIUM) 200 mg tablet Take one tablet by mouth three times daily as needed for Pain. Max 3 days.   ? rosuvastatin (CRESTOR) 5 mg tablet Take one tablet by mouth every  7 days.          Telehealth Patient Reported Vitals     Row Name 02/16/21 0946                Weight: 93.9 kg (207 lb)        Height: 172.7 cm (5' 7.99)        Pain Score: Zero                  Telehealth Body Mass Index: 31.48 at 02/16/2021  9:47 AM    Physical Exam  Constitutional:       Appearance: Normal appearance. She is not ill-appearing.   Neurological:      Mental Status: She is alert.   Psychiatric:         Attention and Perception: Attention normal.         Mood and Affect: Mood normal.         Speech: Speech normal.         Behavior: Behavior normal. Behavior is cooperative.         Limited physical exam due to the nature of virtual visit.        Assessment and Plan:      1. Fecal smearing  - Increase fiber to twice daily  - ARM to evaluate muscle strength  - MRI defecography to evaluate for pelvic floor dyssynergia and presence of cystocele d/t urinary urgency and s/p hysterectomy  - Referral to colorectal surgery for possible intervention for unintentional passage of stool  - psyllium husk (METAMUCIL (WITH SUGAR)) 3.4 gram packet; Take one packet by mouth twice daily.  Dispense: 60 each; Refill: 3  - MRI DEFECOGRAPHY; Future  - AMB REFERRAL TO COLORECTAL SURGERY    2. Anal fissure  - OK to continue OTC hydrocortisone cream  - Increase fiber  - May use preparation H suppositories if bleeding worsens  - Refer to colorectal surgery for possible EUA  - AMB REFERRAL TO COLORECTAL SURGERY    3. Gastroesophageal reflux disease without esophagitis  - Continue omeprazole 20mg  daily  - omeprazole DR (PRILOSEC) 20 mg capsule; Take one capsule by mouth daily before breakfast.  Dispense: 90 capsule; Refill: 3                RTC 3-4 months, after ARM, MRI defecography, and appointment with CRS           40 minutes spent on this patient's encounter with counseling and coordination of care taking >50% of the visit.

## 2021-02-18 ENCOUNTER — Ambulatory Visit: Admit: 2021-02-18 | Discharge: 2021-02-18 | Payer: MEDICARE

## 2021-02-18 ENCOUNTER — Encounter: Admit: 2021-02-18 | Discharge: 2021-02-18 | Payer: MEDICARE

## 2021-02-18 DIAGNOSIS — I2510 Atherosclerotic heart disease of native coronary artery without angina pectoris: Secondary | ICD-10-CM

## 2021-02-18 DIAGNOSIS — U071 COVID-19: Secondary | ICD-10-CM

## 2021-02-18 DIAGNOSIS — R32 Unspecified urinary incontinence: Secondary | ICD-10-CM

## 2021-02-18 DIAGNOSIS — D693 Immune thrombocytopenic purpura: Secondary | ICD-10-CM

## 2021-02-18 DIAGNOSIS — R1310 Dysphagia, unspecified: Secondary | ICD-10-CM

## 2021-02-18 DIAGNOSIS — L900 Lichen sclerosus et atrophicus: Secondary | ICD-10-CM

## 2021-02-18 DIAGNOSIS — C50919 Malignant neoplasm of unspecified site of unspecified female breast: Secondary | ICD-10-CM

## 2021-02-18 DIAGNOSIS — Z0389 Encounter for observation for other suspected diseases and conditions ruled out: Secondary | ICD-10-CM

## 2021-02-18 DIAGNOSIS — C50311 Malignant neoplasm of lower-inner quadrant of right female breast: Secondary | ICD-10-CM

## 2021-02-18 DIAGNOSIS — D696 Thrombocytopenia, unspecified: Secondary | ICD-10-CM

## 2021-02-18 DIAGNOSIS — K219 Gastro-esophageal reflux disease without esophagitis: Secondary | ICD-10-CM

## 2021-02-18 DIAGNOSIS — I10 Essential (primary) hypertension: Secondary | ICD-10-CM

## 2021-02-18 DIAGNOSIS — N390 Urinary tract infection, site not specified: Secondary | ICD-10-CM

## 2021-02-18 DIAGNOSIS — K449 Diaphragmatic hernia without obstruction or gangrene: Secondary | ICD-10-CM

## 2021-02-18 DIAGNOSIS — N904 Leukoplakia of vulva: Secondary | ICD-10-CM

## 2021-02-18 DIAGNOSIS — R159 Full incontinence of feces: Secondary | ICD-10-CM

## 2021-02-18 DIAGNOSIS — D72819 Decreased white blood cell count, unspecified: Secondary | ICD-10-CM

## 2021-02-18 DIAGNOSIS — Z9289 Personal history of other medical treatment: Secondary | ICD-10-CM

## 2021-02-18 DIAGNOSIS — Z923 Personal history of irradiation: Secondary | ICD-10-CM

## 2021-02-18 DIAGNOSIS — E785 Hyperlipidemia, unspecified: Secondary | ICD-10-CM

## 2021-02-18 DIAGNOSIS — Z9221 Personal history of antineoplastic chemotherapy: Secondary | ICD-10-CM

## 2021-02-18 NOTE — Progress Notes
02/18/2021    Name: Teresa Trevino  DOB: Dec 30, 1955  MRN: [id number]    Primary Care Physician: Rockwell Germany   Surgeon: Dr. Ruthy Dick  Radiation oncologist: Dr. Nash Dimmer     Chief Complaint:   Chief Complaint   Patient presents with   ? Heme/Onc Care     Hematology/Oncology History:  Cancer Staging  Malignant neoplasm of lower-inner quadrant of right breast of female, estrogen receptor negative (HCC)  Staging form: Breast, AJCC 8th Edition  - Clinical stage from 01/19/2017: Stage IB (cT1b, cN0, cM0, G3, ER-, PR-, HER2-) - Signed by Dimas Alexandria, PA-C on 01/24/2017    1.  History of chronic intermittent leukopenia and thrombocytopenia, clinically insignificant.  May have autoimmune component and appeared to be her baseline.  However, labs in 01/2017 were unremarkable.  2.  November 2018: Noted on screening mammogram to have 1.2 cm abnormality in the right breast.  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  3.  06/01/2017: Completed neoadjuvant carboplatin/docetaxel x6 with complete response obtained on breast MRI and ultrasound.  After cycle 1, decreased by 20% due to significant toxicity.  4.  07/06/2017: Lumpectomy (Dr. Loreta Ave) showed complete response with no active malignancy.  0/4 lymph nodes involved.  5.  10/05/2017: Completed radiation (Dr. Ardeth Perfect)  6.  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  W.  Current plan: Observation    EGD/colonoscopy 01/2018 ([place]) negative, 10-year follow-up recommended  Mammogram 03/2020 ([place]): Negative    Interval Events  [name] presents to the clinic today unaccompanied for follow-up of her breast cancer.  She recently joined a [place] trip with several friends.  Will be seen by colorectal surgery in the future due to fecal incontinence.  She may start volunteering soon at a local oncology clinic, wants to become more active but does enjoy time at home.  No other new changes to her health.    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  No exercise induced chest pain.  2.  No ECG evidence of ischemia.  3.  No echocardiographic evidence of ischemia.  4.  Normal  Hemodynamic response to exercise.  5.  No significant exercise induced arrhythmias.  6.  Low risk scan.  7.  Consider medical management if indicated.), and Urinary tract infection.    She has a past surgical history that includes hysterectomy (2008); colonoscopy; Foot surgery; ct angiogram (non-invasive) (09/2015); Upper gastrointestinal endoscopy (05/26/2017); tonsillectomy (1977); Mastectomy, partial (Right, 07/06/2017) (RIGHT RADIOACTIVE SEED LOCALIZED LUMPECTOMY performed by Ruthy Dick, DO at [facility] OR/PERIOP); lymph node biopsy (Right, 07/06/2017) (SENTINEL LYMPH NODE BIOPSY performed by Ruthy Dick, DO at [facility] OR/PERIOP); Colonoscopy (N/A, 02/01/2018) (COLONOSCOPY DIAGNOSTIC WITH SPECIMEN COLLECTION BY BRUSHING/ WASHING - FLEXIBLE performed by Virgina Organ, MD at [facility] OR/PERIOP); Upper gastrointestinal endoscopy (N/A, 02/01/2018) (ESOPHAGOGASTRODUODENOSCOPY WITH BIOPSY - FLEXIBLE performed by Virgina Organ, MD at [facility] OR/PERIOP); Bladder  surgery (2013) (bladder sling); and bladder repair (2008) [facility]).    [name]'s family history includes Cancer in her maternal grandmother; Diabetes in her father, maternal grandfather, mother, and paternal uncle; Heart Failure in her father; High Cholesterol in her mother; Hypertension in her mother; Kidney Failure in her mother; None Reported in her sister; Stroke in her mother; Thyroid Disease in her mother.    Social history: Married.  Her husband is a semiretired Education officer, community who is president of the [place].  They have a condo in [place] which they enjoy and also rent out.  She has never been a smoker.  She rarely drinks alcohol, no drug use.        Medications    Current Outpatient Medications:   ?  cholecalciferol (vitamin D3) (OPTIMAL D3) 50,000 units capsule, TAKE ONE CAPSULE BY MOUTH EVERY WEEK, Disp: , Rfl:   ?  clobetasoL (TEMOVATE) 0.05 % topical ointment, Apply BID x 2-4 weeks and taper to TIW prn, Disp: 30 g, Rfl: 1  ?  estradioL (ESTRACE) 0.01 % (0.1 mg/g) vaginal cream, Apply 0.5 gram to the urethral opening at night for 2 weeks then twice weekly, Disp: 42.5 g, Rfl: 11  ?  evolocumab (REPATHA SYRINGE) 140 mg/mL injectable SYRINGE, Inject 1 mL under the skin every 14 days., Disp: 2 mL, Rfl: 11  ?  furosemide (LASIX) 20 mg tablet, Take one tablet by mouth every 48 hours., Disp: 90 tablet, Rfl: 3  ?  hyoscyamine sulfate (LEVSIN/SL) 0.125 mg sublingual tablet, Place one tablet under tongue every 4 hours as needed for Other... (Bladder spasms, urgency, pelvic pain). Max 1.5 mg/ day, Disp: 30 tablet, Rfl: 3  ?  losartan (COZAAR) 50 mg tablet, Take 50 mg by mouth daily., Disp: , Rfl:   ?  omeprazole DR (PRILOSEC) 20 mg capsule, Take one capsule by mouth daily before breakfast., Disp: 90 capsule, Rfl: 3  ?  phenazopyridine (PYRIDIUM) 200 mg tablet, Take one tablet by mouth three times daily as needed for Pain. Max 3 days., Disp: 6 tablet, Rfl: 0  ?  psyllium husk (METAMUCIL (WITH SUGAR)) 3.4 gram packet, Take one packet by mouth twice daily., Disp: 60 each, Rfl: 3  ?  rosuvastatin (CRESTOR) 5 mg tablet, Take one tablet by mouth every 7 days., Disp: 12 tablet, Rfl: 3     Allergies:   Allergies   Allergen Reactions   ? Bactrim [Sulfamethoxazole-Trimethoprim] NAUSEA AND VOMITING     Makes me feel ill   ? Erythromycin NAUSEA AND VOMITING   ? Macrobid [Nitrofurantoin Monohyd/M-Cryst] NAUSEA AND VOMITING   ? Sulfa (Sulfonamide Antibiotics) NAUSEA AND VOMITING       Review of Systems  Review of Systems   Constitutional: Negative.    HENT: Negative.    Eyes: Negative.    Respiratory: Negative.    Cardiovascular: Negative.    Gastrointestinal: Negative.         Fecal incontinence   Endocrine: Negative.    Genitourinary: Negative.    Musculoskeletal: Negative.    Skin: Negative.    Allergic/Immunologic: Negative.    Neurological: Negative.    Hematological: Negative.    Psychiatric/Behavioral: Negative.    All other systems reviewed and are negative.    Pain Score: 0     Pain Addressed: N/A    Patient Evaluated for a Clinical Trial: No treatment clinical trial available for this patient.    [place] Cooperative Oncology Group performance status is 0, Fully active, able to  carry on all pre-disease performance without restriction.    Physical Exam  Vitals:    02/18/21 1248   BP: (!) 148/80   BP Source: Arm, Left Upper   Pulse: 90   Temp: 36.2 ?C (97.2 ?F)   Resp: 16   SpO2: 98%   TempSrc: Temporal   PainSc: Zero   Weight: 93.2 kg (205 lb 6.4 oz)      Physical Exam  Vitals reviewed. Exam conducted with a chaperone present.   Constitutional:       General: She is not in acute distress.     Appearance: She is not ill-appearing.   Eyes:      General: No scleral icterus.  Cardiovascular:      Rate and Rhythm: Normal rate and regular rhythm.      Heart sounds: Normal heart sounds.   Pulmonary:      Effort: Pulmonary effort is normal. No respiratory distress.      Breath sounds: Normal breath sounds. No wheezing or rales.   Chest:   Breasts:     Breasts are symmetrical.      Right: No inverted nipple, mass, nipple discharge, skin change or tenderness.      Left: No inverted nipple, mass, nipple discharge, skin change or tenderness.   Abdominal:      General: There is no distension.      Palpations: Abdomen is soft.      Tenderness: There is no abdominal tenderness.   Musculoskeletal:         General: No swelling.   Lymphadenopathy:      Cervical: No cervical adenopathy.      Upper Body:      Right upper body: No supraclavicular or axillary adenopathy.      Left upper body: No supraclavicular or axillary adenopathy.   Skin:     Coloration: Skin is not jaundiced or pale.      Findings: No rash.   Neurological:      General: No focal deficit present.      Mental Status: She is alert and oriented to person, place, and time.      Cranial Nerves: No cranial nerve deficit.      Motor: No weakness or abnormal muscle tone.      Coordination: Coordination normal.      Gait: Gait is intact.   Psychiatric:         Mood and Affect: Mood and affect normal.         Behavior: Behavior normal.         Thought Content: Thought content normal.         Cognition and Memory: Memory normal.         Judgment: Judgment normal.            Labs/ Imaging /Pathology   Gyn notes reviewed.    Assessment & Plan:  Ms. Zeller is a 66 year old female with the following medical problems:  ?  1.  T1b N0 M0 stage IB triple negative cancer of the RIGHT breast s/p neoadjuvant carboplatin/docetaxel and lumpectomy 06/2017 with complete response, radiation 09/2017.  On observation, now 3.75 years out from her surgery with no clinical evidence of recurrence.  2.  Chronic mild thrombocytopenia.  Clinically insignificant. Stable, present since 2013.      Current plan:  1.  Continue observation.  2.  Next mammogram and breast surgery follow-up anticipated 04/2021 at [place].  3.  Return to clinic in 6 months.  Thank you for the opportunity to participate in her care.    I have completed this exam with a chaperone per [facility] policy.   Chaperone:  Joellen Jersey, MA    Parts of this note were created with voice recognition software. Please excuse any grammatical or typographical errors.

## 2021-02-18 NOTE — Telephone Encounter
New Patient Appointment with Colorectal Surgery (CRS)  Date: Fri (04/09/21)  Time: 11AM  CRS Provider Name (full name): Dr. Cornell Barman    Clinic Location [facility] or [facility] w/address): [employer], [address] [place], [address], [place], [place]  [address]    Verbal directions to appointment given on (date):   1.5.23  Patient has MyChart Access (yes, no w/reason or date instructions sent to pt): yes      Other Appt/Tests Coordinated with this Appt (n/a or test name w/date & time): MRI defecography on Thurs (04/08/21)    Need Hoyer Lift (yes or n/a. For disabled patients.): n/a    Engineer, technical sales (n/a, Language or Language & ID # if used for this appt): n/a    Patient expressed understanding of below:   -Check-in 30 min early (yes/no): yes   -Wait List (yes, n/a or pt declined): n/a MRI defecography on Thurs (04/08/21)  -No-Show/Late Cancellation (NS/LC) Policy (yes, or no w/reason): yes  The surgeons ask that we let all patients know of the NS/LC Policy here at [facility] for all appointments: If you need to cancel/reschedule, please try and let us know as far in advance as possible. If you cancel after 12PM the day before the appointment, it is considered a NS/LC. When you accumulate NS/LC, it gives the provider the option to review and possibly decline future appointments.   -Insurance Referral Authorization (Tricare Prime, [place] CCN, [employer], Ryan Leroy, [employer], [place] [employer] (with name of [place])/Self-Pay (n/a, Self-Pay or Auth # & date range): n/a    CRS Provider Preference (any or name of provider(s)): Any    Referring Provider (name & specialty): Vela Prose, APRN (GI)   Care Team Updated on (date): 1.5.23    Diagnosis: fecal smearing, fecal incontinence, anal fissure         Verbal records history from (pt or name of family member, caregiver, etc.): patient     Continuation of Care    GI Lab Testing/Procedure Reports with any Associated Pathology (FROM LAST 10 YEARS):   Colonoscopy, EGD, Flexible Sigmoidoscopy, Anorectal Manometry, Capsule Endoscopy, Endoscopic Ultrasound   Patient GI Testing History (Approx year, name of test & facility name, or provider name as needed. Or document, Pt with no memory of provider/facility name.):     approx 15 yrs ago - 1st colonoscopies at [facility]. All other GI Testing at [facility]    Operative Report, Surgical Pathology & Discharge Summary (FROM LAST 10 YEARS):   Abdominal & Pelvic Surgeries, Colon, Rectal & Anal Surgeries (Please also note lifetime history.)  Patient Abdominal/Pelvic Surgery History (Approx year, non-clinical name of surgery & facility name, or provider name as needed. Or document, Pt with no memory of provider/facility name.):     approx 10 yrs ago - Full hysterectomy at [facility], [place], [place]    [id number] - urology OR procedure at [facility]    Radiology Imaging Reports & Cloud Images (FROM LAST 5 YEARS):    CT c/a/p, MRI a/p, MRE, Xray: Abdomen (KUB), Pelvis, Acute Abdominal Series, Small Bowel Follow Thru, PET Scan: Full Body or Skull to Thighs. Colon Single Contrast &/or MRI Defecography, Barium/Gastrographin Enemas, Sitz Marker Study  Patient Radiology History (List name of Hospital(s)/Facilities(s) only):     [place]  [facility] [place] [place] - abdominal imaging.     Pelvic Floor Physical Therapy Progress Notes or Biofeedback Testing/Pelvic EMG Testing (FROM LAST 5 YEARS).  Patient Physical Therapy History (n/a or list timeframe w/facility name. Common  diagnoses w/PT: constipation, fecal incontinence, pelvic floor dysfunction, rectocele & rectal prolapse):     approx 5 yrs ago - pelvic floor therapy at [facility]    Gastroenterology Office Visit Notes/Progress Notes (FROM LAST 5 YEARS).  Patient Office Visit Notes/Progress Notes History: CNC/RN to review and request as needed.     Emailed Records/Referral from Physician Consult to [email or web address] (scan into O2) and corresponding CNC/RN (n/a or RN name w/date):  n/a

## 2021-02-23 ENCOUNTER — Encounter: Admit: 2021-02-23 | Discharge: 2021-02-23 | Payer: MEDICARE

## 2021-02-23 MED ORDER — REPATHA SYRINGE 140 MG/ML SC SYRG
140 mg | SUBCUTANEOUS | 11 refills | 28.00000 days | Status: AC
Start: 2021-02-23 — End: ?

## 2021-02-26 ENCOUNTER — Encounter: Admit: 2021-02-26 | Discharge: 2021-02-26 | Payer: MEDICARE

## 2021-02-26 MED ORDER — REPATHA SYRINGE 140 MG/ML SC SYRG
140 mg | SUBCUTANEOUS | 11 refills | 28.00000 days | Status: AC
Start: 2021-02-26 — End: ?

## 2021-03-03 ENCOUNTER — Encounter: Admit: 2021-03-03 | Discharge: 2021-03-03 | Payer: MEDICARE

## 2021-03-03 DIAGNOSIS — I2510 Atherosclerotic heart disease of native coronary artery without angina pectoris: Secondary | ICD-10-CM

## 2021-03-03 DIAGNOSIS — R931 Abnormal findings on diagnostic imaging of heart and coronary circulation: Secondary | ICD-10-CM

## 2021-03-03 DIAGNOSIS — E7801 Familial hypercholesterolemia: Secondary | ICD-10-CM

## 2021-03-03 MED ORDER — REPATHA SYRINGE 140 MG/ML SC SYRG
140 mg | SUBCUTANEOUS | 11 refills | 28.00000 days | Status: AC
Start: 2021-03-03 — End: ?

## 2021-03-03 MED ORDER — ROSUVASTATIN 5 MG PO TAB
5 mg | ORAL_TABLET | ORAL | 3 refills | 90.00000 days | Status: AC
Start: 2021-03-03 — End: ?

## 2021-03-03 NOTE — Progress Notes
PA for repatha submitted for [email or web address]. awaiting decision.

## 2021-03-12 ENCOUNTER — Encounter: Admit: 2021-03-12 | Discharge: 2021-03-12 | Payer: MEDICARE

## 2021-03-12 DIAGNOSIS — Z9221 Personal history of antineoplastic chemotherapy: Secondary | ICD-10-CM

## 2021-03-12 DIAGNOSIS — Z923 Personal history of irradiation: Secondary | ICD-10-CM

## 2021-03-12 DIAGNOSIS — K219 Gastro-esophageal reflux disease without esophagitis: Secondary | ICD-10-CM

## 2021-03-12 DIAGNOSIS — E785 Hyperlipidemia, unspecified: Secondary | ICD-10-CM

## 2021-03-12 DIAGNOSIS — Z0389 Encounter for observation for other suspected diseases and conditions ruled out: Secondary | ICD-10-CM

## 2021-03-12 DIAGNOSIS — Z9289 Personal history of other medical treatment: Secondary | ICD-10-CM

## 2021-03-12 DIAGNOSIS — I2510 Atherosclerotic heart disease of native coronary artery without angina pectoris: Secondary | ICD-10-CM

## 2021-03-12 DIAGNOSIS — I10 Essential (primary) hypertension: Secondary | ICD-10-CM

## 2021-03-12 DIAGNOSIS — N390 Urinary tract infection, site not specified: Secondary | ICD-10-CM

## 2021-03-12 DIAGNOSIS — R1310 Dysphagia, unspecified: Secondary | ICD-10-CM

## 2021-03-12 DIAGNOSIS — L900 Lichen sclerosus et atrophicus: Secondary | ICD-10-CM

## 2021-03-12 DIAGNOSIS — K449 Diaphragmatic hernia without obstruction or gangrene: Secondary | ICD-10-CM

## 2021-03-12 DIAGNOSIS — C50919 Malignant neoplasm of unspecified site of unspecified female breast: Secondary | ICD-10-CM

## 2021-03-12 DIAGNOSIS — D693 Immune thrombocytopenic purpura: Secondary | ICD-10-CM

## 2021-03-12 DIAGNOSIS — D72819 Decreased white blood cell count, unspecified: Secondary | ICD-10-CM

## 2021-03-12 DIAGNOSIS — U071 COVID-19: Secondary | ICD-10-CM

## 2021-03-12 DIAGNOSIS — N904 Leukoplakia of vulva: Secondary | ICD-10-CM

## 2021-03-12 DIAGNOSIS — R32 Unspecified urinary incontinence: Secondary | ICD-10-CM

## 2021-03-31 ENCOUNTER — Encounter: Admit: 2021-03-31 | Discharge: 2021-03-31 | Payer: MEDICARE

## 2021-04-02 ENCOUNTER — Encounter: Admit: 2021-04-02 | Discharge: 2021-04-02 | Payer: MEDICARE

## 2021-04-02 NOTE — Telephone Encounter
Received a note from [employer] stating PA needed for brand name Crestor. Pt was provided with a temporary supply. Reviewed that pt has tried both atorvastatin and generic rosuvastatin and failed both due to severe myalgias. I was advised it is approved from 04/02/21 to 02/13/22 with Ref # [id number].

## 2021-04-05 ENCOUNTER — Encounter: Admit: 2021-04-05 | Discharge: 2021-04-05 | Payer: MEDICARE

## 2021-04-07 ENCOUNTER — Encounter: Admit: 2021-04-07 | Discharge: 2021-04-07 | Payer: MEDICARE

## 2021-04-08 ENCOUNTER — Ambulatory Visit: Admit: 2021-04-08 | Discharge: 2021-04-08 | Payer: MEDICARE

## 2021-04-08 ENCOUNTER — Encounter: Admit: 2021-04-08 | Discharge: 2021-04-08 | Payer: MEDICARE

## 2021-04-08 DIAGNOSIS — R151 Fecal smearing: Secondary | ICD-10-CM

## 2021-04-09 ENCOUNTER — Encounter: Admit: 2021-04-09 | Discharge: 2021-04-09 | Payer: MEDICARE

## 2021-04-09 DIAGNOSIS — D72819 Decreased white blood cell count, unspecified: Secondary | ICD-10-CM

## 2021-04-09 DIAGNOSIS — D693 Immune thrombocytopenic purpura: Secondary | ICD-10-CM

## 2021-04-09 DIAGNOSIS — I10 Essential (primary) hypertension: Secondary | ICD-10-CM

## 2021-04-09 DIAGNOSIS — R1310 Dysphagia, unspecified: Secondary | ICD-10-CM

## 2021-04-09 DIAGNOSIS — Z923 Personal history of irradiation: Secondary | ICD-10-CM

## 2021-04-09 DIAGNOSIS — C50919 Malignant neoplasm of unspecified site of unspecified female breast: Secondary | ICD-10-CM

## 2021-04-09 DIAGNOSIS — I2510 Atherosclerotic heart disease of native coronary artery without angina pectoris: Secondary | ICD-10-CM

## 2021-04-09 DIAGNOSIS — N390 Urinary tract infection, site not specified: Secondary | ICD-10-CM

## 2021-04-09 DIAGNOSIS — E785 Hyperlipidemia, unspecified: Secondary | ICD-10-CM

## 2021-04-09 DIAGNOSIS — Z9289 Personal history of other medical treatment: Secondary | ICD-10-CM

## 2021-04-09 DIAGNOSIS — U071 COVID-19: Secondary | ICD-10-CM

## 2021-04-09 DIAGNOSIS — K449 Diaphragmatic hernia without obstruction or gangrene: Secondary | ICD-10-CM

## 2021-04-09 DIAGNOSIS — L900 Lichen sclerosus et atrophicus: Secondary | ICD-10-CM

## 2021-04-09 DIAGNOSIS — K219 Gastro-esophageal reflux disease without esophagitis: Secondary | ICD-10-CM

## 2021-04-09 DIAGNOSIS — Z9221 Personal history of antineoplastic chemotherapy: Secondary | ICD-10-CM

## 2021-04-09 DIAGNOSIS — N904 Leukoplakia of vulva: Secondary | ICD-10-CM

## 2021-04-09 DIAGNOSIS — R32 Unspecified urinary incontinence: Secondary | ICD-10-CM

## 2021-04-09 DIAGNOSIS — Z0389 Encounter for observation for other suspected diseases and conditions ruled out: Secondary | ICD-10-CM

## 2021-04-09 DIAGNOSIS — R151 Fecal smearing: Secondary | ICD-10-CM

## 2021-04-14 ENCOUNTER — Ambulatory Visit: Admit: 2021-04-14 | Discharge: 2021-04-14 | Payer: MEDICARE

## 2021-04-14 ENCOUNTER — Encounter: Admit: 2021-04-14 | Discharge: 2021-04-14 | Payer: MEDICARE

## 2021-04-14 DIAGNOSIS — K449 Diaphragmatic hernia without obstruction or gangrene: Secondary | ICD-10-CM

## 2021-04-14 DIAGNOSIS — Z9289 Personal history of other medical treatment: Secondary | ICD-10-CM

## 2021-04-14 DIAGNOSIS — R1310 Dysphagia, unspecified: Secondary | ICD-10-CM

## 2021-04-14 DIAGNOSIS — C50919 Malignant neoplasm of unspecified site of unspecified female breast: Secondary | ICD-10-CM

## 2021-04-14 DIAGNOSIS — N904 Leukoplakia of vulva: Secondary | ICD-10-CM

## 2021-04-14 DIAGNOSIS — Z1231 Encounter for screening mammogram for malignant neoplasm of breast: Secondary | ICD-10-CM

## 2021-04-14 DIAGNOSIS — Z923 Personal history of irradiation: Secondary | ICD-10-CM

## 2021-04-14 DIAGNOSIS — U071 COVID-19: Secondary | ICD-10-CM

## 2021-04-14 DIAGNOSIS — K219 Gastro-esophageal reflux disease without esophagitis: Secondary | ICD-10-CM

## 2021-04-14 DIAGNOSIS — D72819 Decreased white blood cell count, unspecified: Secondary | ICD-10-CM

## 2021-04-14 DIAGNOSIS — N390 Urinary tract infection, site not specified: Secondary | ICD-10-CM

## 2021-04-14 DIAGNOSIS — C50311 Malignant neoplasm of lower-inner quadrant of right female breast: Secondary | ICD-10-CM

## 2021-04-14 DIAGNOSIS — Z853 Personal history of malignant neoplasm of breast: Secondary | ICD-10-CM

## 2021-04-14 DIAGNOSIS — I10 Essential (primary) hypertension: Secondary | ICD-10-CM

## 2021-04-14 DIAGNOSIS — R32 Unspecified urinary incontinence: Secondary | ICD-10-CM

## 2021-04-14 DIAGNOSIS — D693 Immune thrombocytopenic purpura: Secondary | ICD-10-CM

## 2021-04-14 DIAGNOSIS — Z08 Encounter for follow-up examination after completed treatment for malignant neoplasm: Secondary | ICD-10-CM

## 2021-04-14 DIAGNOSIS — L900 Lichen sclerosus et atrophicus: Secondary | ICD-10-CM

## 2021-04-14 DIAGNOSIS — Z9189 Other specified personal risk factors, not elsewhere classified: Secondary | ICD-10-CM

## 2021-04-14 DIAGNOSIS — Z9221 Personal history of antineoplastic chemotherapy: Secondary | ICD-10-CM

## 2021-04-14 DIAGNOSIS — Z0389 Encounter for observation for other suspected diseases and conditions ruled out: Secondary | ICD-10-CM

## 2021-04-14 DIAGNOSIS — I2510 Atherosclerotic heart disease of native coronary artery without angina pectoris: Secondary | ICD-10-CM

## 2021-04-14 DIAGNOSIS — E785 Hyperlipidemia, unspecified: Secondary | ICD-10-CM

## 2021-04-14 NOTE — Progress Notes
Name: Teresa Trevino          MRN: [id number]      DOB: [birth year]      AGE: 66 y.o.   DATE OF SERVICE: 04/14/2021               Reason for Visit:  Heme/Onc Care      Teresa Trevino is a 67 y.o. female.     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 returns to the clinic for routine ~4 year follow up of right breast cancer.     HISTORY:  [name] is a caucasian female who presented to the [facility] on 01/25/2017 at age 4 for evaluation of right breast cancer. [name] had no complaints prior to her screening mammogram. A new right breast asymmetry was present on screening mammogram. There are was suspicious on diagnostic imaging and biopsy was recommended. Right breast sono-guided biopsy 01/19/17 ([place]) revealed grade 3 invasive ductal carcinoma. [name] underwent right RSL lumpectomy/SLNB on 07/06/17. She finished radiation with Dr. Ardeth Perfect on 10/05/17.    PATHOLOGY:  Tumor:  Size/Extent of Tumor Bed: 1.2 x 0.6 cm   Size/Extent of Residual Invasive Tumor: No invasive tumor identified   Overall Residual Cellularity of the Tumor Bed: <1%   1.5 mm DCIS present  Margins Free From Tumor:  Yes  ER:  negative   PR:  negative    Her 2:  negative  Grade:  3  Lymph Nodes:  0/4  LVSI:  no  Extranodal extension:  no      BREAST IMAGING:  Mammogram:    -- Bilateral screening mammogram 12/20/16 ([place]) revealed scattered fibroglandular densities. There was a focal asymmetry present within the central posterior right breast. Additional diagnostic images and ultrasound were recommended. No abnormalities were present on the left breast.  -- Right diagnostic mammogram 01/04/17 ([place]) revealed an approximately 1.2 cm x 0.6 cm x 0.5 cm low-density mass at 4:00 with associated calcifications. This was considered suspicious and further evaluation with ultrasound will be performed.    Ultrasound:    -- Targeted right breast ultrasound 01/04/17 ([place]) revealed at 4:00 there was an irregular hypoechoic mass measuring approximately 6 mm x 9 mm x 8 mm with associated microcalcifications. This was thought to likely correlate with the mammographic abnormality and was considered somewhat suspicious. Ultrasound-guided biopsy was recommended. If the clip marker form sono guided biopsy of this mass did not correlate to the mammographic finding, additional stereotactic biopsy might be required. These findings recommendations were discussed in detail with the patient at the time of today's procedure.     REPRODUCTIVE HEALTH:  Age at first Menarche: Unknown   Age at Menopause:  Hysterectomy/BSO at 62, HRT cream for several months    PROCEDURE: Right RSL lumpectomy/SLNB, 07/06/17  PERTINENT PMH:  HTN, chronic leukopenia  FAMILY HISTORY:  Maternal Grandmother- Breast cancer dx age late 65s  PHYSICAL EXAM on PRESENTATION: Right - No palpable breast masses. No skin, nipple, or areolar change.  Left - No palpable breast masses. No skin, nipple, or areolar change. No supraclavicular or axillary adenopathy.   MEDICAL ONCOLOGY:  Dr. Olin Pia NEOADJUVANT THERAPY: Taxotere/carbo completed 06/01/17  REFERRED BY:  Efraim Kaufmann [place] PA       Review of Systems    Constitutional: Negative for fever, chills, appetite change and fatigue.   HENT: Negative for hearing loss, congestion, rhinorrhea and tinnitus.    Eyes: Negative for pain, discharge and itching.   Respiratory: Negative for cough, chest tightness and shortness of breath.    Cardiovascular: Negative for chest pain and palpitations.   Gastrointestinal: Negative for abdominal distention, pain, nausea, vomiting, and diarrhea.   Genitourinary: Negative for frequency, vaginal bleeding, difficulty urinating and pelvic pain.   Musculoskeletal: Negative for myalgias, back pain, joint swelling and arthralgias.   Skin: Negative for rash.   Neurological: Negative for dizziness, weakness, light-headedness and headaches.   Hematological: Does not bruise/bleed easily.   Psychiatric/Behavioral: Negative for disturbed wake/sleep cycle. The patient is not nervous/anxious.    Allergies   Allergen Reactions   ? Bactrim [Sulfamethoxazole-Trimethoprim] NAUSEA AND VOMITING     Makes me feel ill   ? Erythromycin NAUSEA AND VOMITING   ? Macrobid [Nitrofurantoin Monohyd/M-Cryst] NAUSEA AND VOMITING   ? Sulfa (Sulfonamide Antibiotics) NAUSEA AND VOMITING       Medical History:   Diagnosis Date   ? Breast cancer (HCC) 01/2017    right IDC   ? Chronic leukopenia    ? Coronary artery disease    ? COVID-19 10/2018   ? Difficulty swallowing     past issue during chemotherapy-no longer an issue per pt   ? Dyslipidemia    ? GERD (gastroesophageal reflux disease)    ? Hiatal hernia    ? History of blood transfusion     during chemo therapy   ? History of chemotherapy    ? History of EKG 01/2017   ? History of radiation therapy    ? Hyperlipemia 08/20/2015   ? Hypertension    ? Incontinence    ? 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 Lichen sclerosus    ? Lichen sclerosus et atrophicus of the vulva 09/22/2015   ? Observation for suspected cardiovascular disease 08/20/2015    04/21/15  Stress Echo :  1.  No exercise induced chest pain.  2.  No ECG evidence of ischemia.  3.  No echocardiographic evidence of ischemia.  4.  Normal  Hemodynamic response to exercise.  5.  No significant exercise induced arrhythmias.  6.  Low risk scan.  7.  Consider medical management if indicated.   ? Urinary tract infection      Surgical History:   Procedure Laterality Date   ? TONSILLECTOMY  1977   ? HX HYSTERECTOMY 2008   ? BLADDER REPAIR  2008    [facility]   ? BLADDER SURGERY  2013    bladder sling   ? CT ANGIOGRAM (NON-INVASIVE)  09/2015   ? UPPER GASTROINTESTINAL ENDOSCOPY  05/26/2017   ? RIGHT RADIOACTIVE SEED LOCALIZED LUMPECTOMY Right 07/06/2017    Performed by Ruthy Dick, DO at [facility] OR   ? IDENTIFICATION SENTINEL LYMPH NODE Right 07/06/2017    Performed by Ruthy Dick, DO at [facility] OR   ? INJECTION RADIOACTIVE TRACER FOR SENTINEL NODE IDENTIFICATION Right 07/06/2017    Performed by Ruthy Dick, DO at [facility] OR   ? SENTINEL LYMPH NODE BIOPSY Right  07/06/2017    Performed by Ruthy Dick, DO at [facility] OR   ? COLONOSCOPY DIAGNOSTIC WITH SPECIMEN COLLECTION BY BRUSHING/ WASHING - FLEXIBLE N/A 02/01/2018    Performed by Virgina Organ, MD at [facility] OR   ? ESOPHAGOGASTRODUODENOSCOPY WITH BIOPSY - FLEXIBLE N/A 02/01/2018    Performed by Virgina Organ, MD at [facility] OR   ? ANORECTAL MANOMETRY N/A 03/12/2021    Performed by Eliott Nine, MD at [facility] ENDO   ? COLONOSCOPY     ? FOOT SURGERY       Family History   Problem Relation Age of Onset   ? Diabetes Mother    ? Hypertension Mother    ? High Cholesterol Mother    ? Stroke Mother    ? Thyroid Disease Mother    ? Kidney Failure Mother         on HD   ? Diabetes Father         since age 75   ? Heart Failure Father    ? Diabetes Paternal Uncle    ? Cancer Maternal Grandmother    ? Diabetes Maternal Grandfather    ? None Reported Sister    ? Melanoma Neg Hx      Social History     Socioeconomic History   ? Marital status: Married   Tobacco Use   ? Smoking status: Never   ? Smokeless tobacco: Never   Substance and Sexual Activity   ? Alcohol use: Yes     Alcohol/week: 4.0 standard drinks     Types: 2 Shots of liquor, 2 Standard drinks or equivalent per week     Comment: 2 drinks per week   ? Drug use: No   ? Sexual activity: Not Currently     Partners: Male                   Objective:         ? cholecalciferol (vitamin D3) (OPTIMAL D3) 50,000 units capsule TAKE ONE CAPSULE BY MOUTH EVERY WEEK   ? clobetasoL (TEMOVATE) 0.05 % topical ointment Apply BID x 2-4 weeks and taper to TIW prn   ? estradioL (ESTRACE) 0.01 % (0.1 mg/g) vaginal cream Apply 0.5 gram to the urethral opening at night for 2 weeks then twice weekly   ? evolocumab (REPATHA SYRINGE) 140 mg/mL injectable SYRINGE Inject 1 mL under the skin every 14 days.   ? furosemide (LASIX) 20 mg tablet Take one tablet by mouth every 48 hours.   ? hyoscyamine sulfate (LEVSIN/SL) 0.125 mg sublingual tablet Place one tablet under tongue every 4 hours as needed for Other... (Bladder spasms, urgency, pelvic pain). Max 1.5 mg/ day   ? losartan (COZAAR) 50 mg tablet Take one tablet by mouth daily.   ? omeprazole DR (PRILOSEC) 20 mg capsule Take one capsule by mouth daily before breakfast.   ? phenazopyridine (PYRIDIUM) 200 mg tablet Take one tablet by mouth three times daily as needed for Pain. Max 3 days.   ? psyllium husk (METAMUCIL (WITH SUGAR)) 3.4 gram packet Take one packet by mouth twice daily.   ? rosuvastatin (CRESTOR) 5 mg tablet Take one tablet by mouth every 7 days.     Vitals:    04/14/21 1058   BP: 120/72   BP Source: Arm, Left Upper   Pulse: 71   Temp: 36.4 ?C (97.6 ?F)   SpO2: 99%   TempSrc: Temporal   PainSc: Zero   Weight: 94.4 kg (  208 lb 3.2 oz)   Height: 172.7 cm (5' 8)     Body mass index is 31.66 kg/m?Marland Kitchen     Pain Score: Zero            Pain Addressed:  N/A    Patient Evaluated for a Clinical Trial: No treatment clinical trial available for this patient.     [place] Cooperative Oncology Group performance status is 0, Fully active, able to carry on all pre-disease performance without restriction.Marland Kitchen     Physical Exam  Vitals reviewed.     RIGHT BREAST EXAM:  Breast:  S/p lumpectomy/SLNB. 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 No palpable masses  Skin Erythema:  No  Attachment of Overlying Skin:  No  Peau d' orange:  No  Chest Wall Attachment:  No  Nipple Inversion:  No  Nipple Discharge: No    LEFT BREAST EXAM:  Breast: No palpable masses  Skin Erythema:  No  Attachment of Overlying Skin:  No  Peau d' orange:  No  Chest Wall Attachment: No  Nipple Inversion:  No  Nipple Discharge:  No    RIGHT NODAL BASIN EXAM:  Axillary:  negative  Infraclavicular:  negative  Supraclavicular:  negative    LEFT NODAL BASIN EXAM:  Axillary:  negative  Infraclavicular: negative  Supraclavicular:  negative      Constitutional: No acute distress.  HEENT:  Head: Normocephalic and atraumatic.  Eyes: No discharge. No scleral icterus.  Pulmonary/Chest: No respiratory distress.   Neurological: Alert and oriented to person, place and time. No cranial nerve deficit.  Skin: Warm and dry. No rash noted. No erythema. No pallor.  Psychiatric: Normal mood and affect. Behavior is normal. Judgement and thought content normal.            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 Halleck reports she is doing well. She has established care with Dr. Kathee Delton for fecal incontinence. He is having her try PT for 3 months and if no improvement, than he would perform surgery for intussuspetion. 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 She continues follow up with Dr. Freida Busman for medical oncology for breast cancer. She is under current observation. She has no new breast complaints except some occasional right breast discomfort. 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 We discussed restarting her massage when she remembers which should help with the discomfort. She has a follow up with Dr. Isabella Stalling this month for radiation follow up. She had a BIS today for arm lymphedema surveillance and has no arm complaints. Ms. Cederberg had bilateral screening mammogram today which showed no abnormalities. I will plan to see her back in 1 year with screening mammogram. After that, she can continue Survivorship with Dr. Freida Busman. She was given ample time to ask questions all of which were answered to her satisfaction. She was encouraged to call with any interval questions or concerns.    1. Continue follow up with Dr. Freida Busman  2. Continue follow up with Dr. Isabella Stalling  3. Continue follow up with dermatology  4. Continue follow up with Dr. Kathee Delton  5. BIS in 1 year  6. RTC in 1 year with bilateral screening mammogram    Guy Begin, PA-C

## 2021-04-14 NOTE — Progress Notes
Bioimpedance Spectroscopy performed.  Advised patient that additional information will be sent via Mychart (preferred) or phone if indicated by the lymphedema nurse within 24 hours.

## 2021-04-14 NOTE — Progress Notes
Reviewed BIS testing results from today   Results    Current -5.6  Baseline 1.3  Change from Baseline -6.9  WNL less than 3 standard deviation increase from baseline.      Notified patient viaMyChart result was normal and to continue with routine follow up as scheduled.  Provided clinic contact information for any questions or concerns.

## 2021-04-15 ENCOUNTER — Encounter: Admit: 2021-04-15 | Discharge: 2021-04-15 | Payer: MEDICARE

## 2021-05-07 NOTE — Progress Notes
Radiation Oncology Follow Up Note  Date: 05/12/2021       Teresa Trevino is a 66 y.o. female.     There were no encounter diagnoses.  Staging: Cancer Staging   Malignant neoplasm of lower-inner quadrant of right breast of female, estrogen receptor negative (HCC)  Staging form: Breast, AJCC 8th Edition  - Clinical stage from 01/19/2017: Stage IB (cT1b, cN0, cM0, G3, ER-, PR-, HER2-) - Signed by Dimas Alexandria, PA-C on 01/24/2017      History of Present Illness    66 year-old female with triple negative right breast cancer, stage IB (T1B N0 M0), status post neoadjuvant carboplatin/docetaxel and lumpectomy 06/2017 with complete response. She was recommended to receive adjuvant radiotherapy.     Diagnosis:   Stage IB ER-, PR-, HER2- right breast cancer    Treatment History:   Teresa Trevino underwent adjuvant radiotherapy to her right breast 09/07/2017 - 10/05/2017. She tolerated treatment well and had no treatment interruptions.        09/28/2017   Course ID C1 RT BREAST   First Treatment Date 09-07-2017 12:45PM   Last Treatment Date 09-28-2017 10:55AM   Treatment Elapsed Days 21   Reference Point ID RT BREAST   Dosage Given To Date 40.05   Session Dosage Given 2.67   Plan ID RT BREAST_FiF   Fractions Treated to Date 15   Total Fractions on Plan 15   Prescribed Dose per Fraction 2.67   Prescription Dose 4,005         10/05/2017   Course ID C1 RT BREAST   First Treatment Date 09-07-2017 12:45PM   Last Treatment Date 10-05-2017 11:50AM   Treatment Elapsed Days 28   Reference Point ID [id number]   Dosage Given To Date 10   Session Dosage Given 2   Plan ID BOOST ELEC   Fractions Treated to Date 5   Total Fractions on Plan 5   Prescribed Dose per Fraction 2   Prescription Dose 1,000     Total Dose: 5,005 cGy    Subjective:       Teresa Trevino returns today for routine follow-up after completion of radiation therapy. She reports that she is feeling quite well. She is relieved that her recent mammogram looked good and is happy to be about 4 years out from her diagnosis. She denies any recent skin or breast changes. She does have ongoing edema of the right breast and continues to utilize some compression and breast elevation techniques. Her breast occasionally becomes tender at the end of the day but this improves when she removes her bra. She has some mild fatigue which she attributes to general life. She denies swelling of her arms or impaired range of motion of the shoulder. She denies cough, shortness of air, fevers. No changes in vision, headaches, dizziness. 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 She is following with an outside dermatologist in [place], [place] and is currently on a course of radiation therapy with 17 planned fractions; she started in early March and is anticipated to complete in early May. She notes improvement in her pain in the area since starting radiation.       Review of Systems   Constitutional: Negative for fatigue (mild).   HENT: Negative.    Respiratory: Negative.    Cardiovascular: Negative.    Skin: Wound: left shin.   Neurological: Negative for dizziness, weakness and  numbness.   Psychiatric/Behavioral: Negative.        Objective:         ? cholecalciferol (vitamin D3) (OPTIMAL D3) 50,000 units capsule TAKE ONE CAPSULE BY MOUTH EVERY WEEK   ? clobetasoL (TEMOVATE) 0.05 % topical ointment Apply BID x 2-4 weeks and taper to TIW prn   ? estradioL (ESTRACE) 0.01 % (0.1 mg/g) vaginal cream Apply 0.5 gram to the urethral opening at night for 2 weeks then twice weekly   ? evolocumab (REPATHA SYRINGE) 140 mg/mL injectable SYRINGE Inject 1 mL under the skin every 14 days.   ? furosemide (LASIX) 20 mg tablet Take one tablet by mouth every 48 hours.   ? hyoscyamine sulfate (LEVSIN/SL) 0.125 mg sublingual tablet Place one tablet under tongue every 4 hours as needed for Other... (Bladder spasms, urgency, pelvic pain). Max 1.5 mg/ day   ? losartan (COZAAR) 50 mg tablet Take one tablet by mouth daily.   ? omeprazole DR (PRILOSEC) 20 mg capsule Take one capsule by mouth daily before breakfast.   ? phenazopyridine (PYRIDIUM) 200 mg tablet Take one tablet by mouth three times daily as needed for Pain. Max 3 days.   ? psyllium husk (METAMUCIL (WITH SUGAR)) 3.4 gram packet Take one packet by mouth twice daily.   ? rosuvastatin (CRESTOR) 5 mg tablet Take one tablet by mouth every 7 days.     There were no vitals filed for this visit.  There is no height or weight on file to calculate BMI.                   KARNOFSKY PERFORMANCE SCORE:  90% Able to carry on normal activity; minor signs of disease     Physical Exam  Vitals reviewed.   Constitutional:       General: She is not in acute distress.     Appearance: Normal appearance. She is well-developed. She is not ill-appearing.   HENT:      Head: Normocephalic and atraumatic.   Eyes:      General: Lids are normal.      Extraocular Movements: Extraocular movements intact.      Conjunctiva/sclera: Conjunctivae normal.   Cardiovascular:      Rate and Rhythm: Normal rate and regular rhythm.      Heart sounds: Normal heart sounds.   Pulmonary:      Effort: Pulmonary effort is normal.      Breath sounds: Normal breath sounds.   Chest:      Comments: Edema of the right breast but otherwise no palpable abnormalities in bilateral breasts.  No skin, nipple, or areolar changes bilaterally.      Musculoskeletal:      Cervical back: Neck supple.   Lymphadenopathy:      Cervical: No cervical adenopathy.      Upper Body:      Right upper body: No supraclavicular or axillary adenopathy.      Left upper body: No supraclavicular or axillary adenopathy.   Skin:     General: Skin is warm and dry.      Coloration: Skin is not pale.   Neurological:      Mental Status: She is alert and oriented to person, place, and time. Mental status is at baseline.      Cranial Nerves: Cranial nerves 2-12 are intact.      Sensory: Sensation is intact. Motor: Motor function is intact.   Psychiatric:         Attention and  Perception: Attention normal.         Mood and Affect: Mood and affect normal.         Speech: Speech normal.         Behavior: Behavior normal.         Cognition and Memory: Cognition and memory normal.            Laboratory:    Comprehensive Metabolic Profile    Lab Results   Component Value Date/Time    NA 139 09/02/2020 12:00 AM    K 4.2 09/02/2020 12:00 AM    CL 104 09/02/2020 12:00 AM    CO2 28.0 09/02/2020 12:00 AM    GAP 11 09/02/2020 12:00 AM    BUN 14.0 09/02/2020 12:00 AM    CR 0.80 09/02/2020 12:00 AM    GLU 96 09/02/2020 12:00 AM    Lab Results   Component Value Date/Time    CA 9.4 09/02/2020 12:00 AM    ALBUMIN 4.2 09/02/2020 12:00 AM    TOTPROT 7.1 09/02/2020 12:00 AM    ALKPHOS 49 09/02/2020 12:00 AM    AST 23 09/02/2020 12:00 AM    ALT 24 09/02/2020 12:00 AM    TOTBILI 0.54 09/02/2020 12:00 AM    GFR 76.5 09/02/2020 12:00 AM    GFRAA >60 06/22/2017 12:51 PM        CBC w diff    Lab Results   Component Value Date/Time    WBC 4.8 09/04/2020 12:00 AM    RBC 4.08 (L) 09/04/2020 12:00 AM    HGB 13.8 09/04/2020 12:00 AM    HCT 41.8 09/04/2020 12:00 AM    MCV 102.0 (H) 09/04/2020 12:00 AM    MCH 33.8 (H) 09/04/2020 12:00 AM    MCHC 33.0 09/04/2020 12:00 AM    RDW 11.4 (L) 09/04/2020 12:00 AM    PLTCT 139 09/04/2020 12:00 AM    MPV 7.1 01/17/2018 10:56 AM    Lab Results   Component Value Date/Time    NEUT 59 01/17/2018 10:56 AM    ANC 2.90 01/17/2018 10:56 AM    LYMA 28 01/17/2018 10:56 AM    ALC 1.40 01/17/2018 10:56 AM    MONA 11 01/17/2018 10:56 AM    AMC 0.60 01/17/2018 10:56 AM    EOSA 1 01/17/2018 10:56 AM    AEC 0.10 01/17/2018 10:56 AM    BASA 1 01/17/2018 10:56 AM    ABC 0.00 01/17/2018 10:56 AM          Imaging:   MAMMO SCREEN BILAT/TOMO/CAD  Narrative: Last mammogram was performed 1 year ago.    [id number] DIGITAL MAMMO SCREEN BILAT/TOMO/CAD: April 14, 2021 -   ACCESSION #: [id number]  3D Procedure  3D Routine views.  2D Synthetic Routine views.    Prior study comparison: April 13, 2020, bilateral [id number]   DIGITAL MAMMO SCREEN BILAT/TOMO/CAD performed at [facility]   [facility].  March 29, 2019, bilateral [id number]   DIGITAL MAMMO SCREEN BILAT/TOMO/CAD, performed at [facility]   [facility].  March 02, 2018, bilateral [id number]   MAMMO DIAGNOSTIC BIL/TOMO, performed at [facility].    There are scattered areas of fibroglandular density.  3D and   reconstructed 2D images were obtained.    Benign post-therapeutic changes are seen. No new suspicious   abormality is seen.    Electronically signed and approved by: Gwynneth Aliment, M.D.   [id number]  Impression: ASSESSMENT: Terance Hart  2-Benign    RECOMMENDATION:  Routine screening mammogram in 1 year.       Path:  PATHOLOGY REPORT   Date Value Ref Range Status   02/01/2018   Final    [facility]  [email or web address]    Department of Pathology and Laboratory Medicine  [address], [place], [place] [address]  Surgical Pathology Office:  [phone]  Fax:  [phone]  SURGICAL PATHOLOGY REPORT    NAME: MARIKO, NOWAKOWSKI SURG PATH #: [id number] MR #: [id number] SPECIMEN  CLASS: SR BILLING #: [id number] ALT ID #:  LOCATION: [facility] DATE OF  PROCEDURE: 02/01/2018 AGE:  62 SEX: F DATE RECEIVED: 02/02/2018 DOB:  December 27, 1955  TIME RECEIVED:  07:20 PHYSICIAN: SIDORENKO,ELENA   MGA DATE OF  REPORT: 02/05/2018 COPY TO:  DATE OF PRINTING: 02/05/2018         ########################################################################  Final Diagnosis:    A. Gastric mucosa, gastric polyps, biopsy:  Fundic gland polyps.     B. Gastric mucosa, gastric biopsies r/o H. pylori, biopsy:  Mild chronic inactive gastritis.   No H. pylori-like organisms are identified on H and E sections.       Attestation:  By this signature, I attest that I have personally formulated the final  interpretation expressed in this report and that the above diagnosis is  based upon my examination of the slides and/or other material indicated in  this report.    +++Electronically Signed Out By Kellie Simmering, MD, PhD on 02/05/2018+++             zh/02/02/2018             ########################################################################  Material Received:  A: gastric polyps  B: gastric biopsies r/o h. pylori    History:  66 year old female with history of gastroesophageal reflux disease,  esophagitis presence not specified, gastric polyps, encounter for  colorectal cancer screening.  B. Rule out H. pylori.         Gross Description:  A. Received in formalin labeled gastric polyps is a 0.6 x 0.5 x 0.3 cm  aggregate of tan-brown soft tissue fragments. The specimen is entirely  submitted in cassette A1. (tn)    B. Received in formalin labeled gastric biopsies rule out H. pylori is a  0.7 x 0.6 x 0.5 cm aggregate of tan-brown soft tissue fragments. The  specimen is entirely submitted in cassette B1. (tn)    tan/02/02/2018              ]     Assessment and Plan:    Teresa Trevino is a 65 year-old female with triple negative right breast cancer, stage IB (T1B N0 M0), status post neoadjuvant carboplatin/docetaxel and lumpectomy 06/2017 with complete response. She completed adjuvant radiation to the right breast to a total of 5,005 cGy in 20 fractions between 09/07/2017 and 10/05/2017.    - Teresa Trevino was seen today for routine follow-up after completion of radiation therapy to her right breast about 3.5 years ago in 09/2017.  At this time, she is clinically stable with no clinical evidence of disease recurrence or progression.  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 Mammogram 04/14/2021 with anticipated post-therapeutic changes but no suspicious abnormalities.   - Our plan will be for the patient to return to our clinic in 1 year for continued follow-up.  She may contact us in the interim if there are any questions or concerns requiring earlier assessment.  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   -The patient will keep their appointments with their other managing providers, including   Dr. Freida Busman of medical oncology and breast surgery; her next mammogram is due 04/2022.                Total time for today's encounter including obtaining and review of records, patient face to face time, order entry, placing referrals, and documentation was 30 minutes.    Liston Alba AGNP-C  [facility] - Radiation Oncology  [address]  [place], [place] [address]    Collaborating Physician: Elby Showers, MD    Parts of this note were created using voice recognition software.  Please excuse any grammatical or typographical errors.

## 2021-05-12 ENCOUNTER — Ambulatory Visit: Admit: 2021-05-12 | Discharge: 2021-05-12 | Payer: MEDICARE

## 2021-05-12 ENCOUNTER — Encounter: Admit: 2021-05-12 | Discharge: 2021-05-12 | Payer: MEDICARE

## 2021-05-12 DIAGNOSIS — Z923 Personal history of irradiation: Secondary | ICD-10-CM

## 2021-05-12 DIAGNOSIS — K449 Diaphragmatic hernia without obstruction or gangrene: Secondary | ICD-10-CM

## 2021-05-12 DIAGNOSIS — Z9289 Personal history of other medical treatment: Secondary | ICD-10-CM

## 2021-05-12 DIAGNOSIS — N904 Leukoplakia of vulva: Secondary | ICD-10-CM

## 2021-05-12 DIAGNOSIS — K219 Gastro-esophageal reflux disease without esophagitis: Secondary | ICD-10-CM

## 2021-05-12 DIAGNOSIS — E785 Hyperlipidemia, unspecified: Principal | ICD-10-CM

## 2021-05-12 DIAGNOSIS — C50919 Malignant neoplasm of unspecified site of unspecified female breast: Secondary | ICD-10-CM

## 2021-05-12 DIAGNOSIS — U071 COVID-19: Secondary | ICD-10-CM

## 2021-05-12 DIAGNOSIS — D693 Immune thrombocytopenic purpura: Secondary | ICD-10-CM

## 2021-05-12 DIAGNOSIS — Z09 Encounter for follow-up examination after completed treatment for conditions other than malignant neoplasm: Secondary | ICD-10-CM

## 2021-05-12 DIAGNOSIS — L900 Lichen sclerosus et atrophicus: Secondary | ICD-10-CM

## 2021-05-12 DIAGNOSIS — R1310 Dysphagia, unspecified: Secondary | ICD-10-CM

## 2021-05-12 DIAGNOSIS — D72819 Decreased white blood cell count, unspecified: Secondary | ICD-10-CM

## 2021-05-12 DIAGNOSIS — R32 Unspecified urinary incontinence: Secondary | ICD-10-CM

## 2021-05-12 DIAGNOSIS — I10 Essential (primary) hypertension: Secondary | ICD-10-CM

## 2021-05-12 DIAGNOSIS — I2510 Atherosclerotic heart disease of native coronary artery without angina pectoris: Secondary | ICD-10-CM

## 2021-05-12 DIAGNOSIS — N390 Urinary tract infection, site not specified: Secondary | ICD-10-CM

## 2021-05-12 DIAGNOSIS — Z9221 Personal history of antineoplastic chemotherapy: Secondary | ICD-10-CM

## 2021-05-12 DIAGNOSIS — C4490 Unspecified malignant neoplasm of skin, unspecified: Secondary | ICD-10-CM

## 2021-05-12 DIAGNOSIS — Z0389 Encounter for observation for other suspected diseases and conditions ruled out: Secondary | ICD-10-CM

## 2021-06-03 ENCOUNTER — Encounter: Admit: 2021-06-03 | Discharge: 2021-06-03 | Payer: MEDICARE

## 2021-06-03 ENCOUNTER — Ambulatory Visit: Admit: 2021-06-03 | Discharge: 2021-06-03 | Payer: MEDICARE

## 2021-06-03 DIAGNOSIS — R1310 Dysphagia, unspecified: Secondary | ICD-10-CM

## 2021-06-03 DIAGNOSIS — Z923 Personal history of irradiation: Secondary | ICD-10-CM

## 2021-06-03 DIAGNOSIS — C50919 Malignant neoplasm of unspecified site of unspecified female breast: Secondary | ICD-10-CM

## 2021-06-03 DIAGNOSIS — D693 Immune thrombocytopenic purpura: Secondary | ICD-10-CM

## 2021-06-03 DIAGNOSIS — K219 Gastro-esophageal reflux disease without esophagitis: Secondary | ICD-10-CM

## 2021-06-03 DIAGNOSIS — R931 Abnormal findings on diagnostic imaging of heart and coronary circulation: Secondary | ICD-10-CM

## 2021-06-03 DIAGNOSIS — D72819 Decreased white blood cell count, unspecified: Secondary | ICD-10-CM

## 2021-06-03 DIAGNOSIS — L900 Lichen sclerosus et atrophicus: Secondary | ICD-10-CM

## 2021-06-03 DIAGNOSIS — Z9221 Personal history of antineoplastic chemotherapy: Secondary | ICD-10-CM

## 2021-06-03 DIAGNOSIS — E7800 Pure hypercholesterolemia, unspecified: Secondary | ICD-10-CM

## 2021-06-03 DIAGNOSIS — U071 COVID-19: Secondary | ICD-10-CM

## 2021-06-03 DIAGNOSIS — Z136 Encounter for screening for cardiovascular disorders: Secondary | ICD-10-CM

## 2021-06-03 DIAGNOSIS — R32 Unspecified urinary incontinence: Secondary | ICD-10-CM

## 2021-06-03 DIAGNOSIS — Z0389 Encounter for observation for other suspected diseases and conditions ruled out: Secondary | ICD-10-CM

## 2021-06-03 DIAGNOSIS — I872 Venous insufficiency (chronic) (peripheral): Secondary | ICD-10-CM

## 2021-06-03 DIAGNOSIS — C4490 Unspecified malignant neoplasm of skin, unspecified: Secondary | ICD-10-CM

## 2021-06-03 DIAGNOSIS — N904 Leukoplakia of vulva: Secondary | ICD-10-CM

## 2021-06-03 DIAGNOSIS — Z9289 Personal history of other medical treatment: Secondary | ICD-10-CM

## 2021-06-03 DIAGNOSIS — I10 Essential (primary) hypertension: Secondary | ICD-10-CM

## 2021-06-03 DIAGNOSIS — K449 Diaphragmatic hernia without obstruction or gangrene: Secondary | ICD-10-CM

## 2021-06-03 DIAGNOSIS — I2510 Atherosclerotic heart disease of native coronary artery without angina pectoris: Secondary | ICD-10-CM

## 2021-06-03 DIAGNOSIS — E785 Hyperlipidemia, unspecified: Secondary | ICD-10-CM

## 2021-06-03 DIAGNOSIS — N390 Urinary tract infection, site not specified: Secondary | ICD-10-CM

## 2021-06-03 NOTE — Patient Instructions
Thank you for visting our office today!!    [facility] N-13 amonia PET/CT- call Radiology to schedule at [phone]  Follow up with Ginger McIntosh-James, APRN-NP in 6 months in [place].  Follow up with Dr. Nadara Eaton in 1 year in [place].    My Nursing team's voice mail: [phone]   Fax: [phone]   Scheduling: [phone]  (For appointments six months or further out, call approximatley four months before appointment is due, for best availability.)     NOTE: MyChart messages and phone calls received on weekends, on holidays, and after 4 pm on weekdays will NOT be seen until the following business day. If you have an urgent matter during these times, please call the main switchboard at [phone] to reach the on-call team.

## 2021-06-03 NOTE — Progress Notes
Date of Service: 06/03/2021    Teresa Trevino is a 66 y.o. female.       HPI     Teresa Trevino is a delightful 66 year old lady followed through this office for minimal coronary disease, familial hypercholesterolemia, hypertension and we focus on risk factor control.  Her cholesterol is not been well controlled with statins and she has an intolerance to high-dose statins.    She is doing well on PCSK9 inhibitor.  We added rosuvastatin 5 mg weekly and LDL is at target.  She is also followed for carcinoma of the breast treated with radiation therapy and Taxotere and carboplatin which do not typically cause cardiotoxicity.  An echocardiogram after therapy demonstrated normal left-ventricular function and I do not believe we need to be concerned about cardiotoxicity  ?  Teresa Trevino is back for routine follow-up today.  She denies any cardiovascular symptoms.  She is not having chest pain or angina.  She did very well with her cholesterol.  She does have a history of coronary calcification but a negative stress test.  In 2000 her total cholesterol was 236 with an LDL of 172.  On Repatha and 5 mg rosuvastatin weekly she has no myalgias and her cholesterol is controlled.  Her most recent values from last month showed total cholesterol of 124 LDL 66 HDL 44 and VLDL 14.  [place] had the LDL in the 50s ideally, this represents a 60% reduction from baseline and I think is an excellent result.  She cannot tolerate higher doses of rosuvastatin or Zetia.    I do have concern for possible coronary disease due to the radiation therapy to her chest and history of coronary calcification.  I think we will need some surveillance stress test however she is not due for at this time.  She tells me she is doing well.  She feels good and is, is energetic, and is as active as she wants to be.    Examination he is normal.  Blood pressure is 110/76.  EKG shows normal sinus rhythm and a normal EKG.    Impression  1.  Familial hypercholesterolemia.  On Repatha and minimal rosuvastatin cholesterol is excellent.  LDL is at target at 66.  2.  Statin intolerance.  Can only tolerate 5 mg of rosuvastatin weekly.  Cannot tolerate any other statins or Zetia  3.  Coronary artery disease with mild coronary calcification  CT coronary angiogram 2017 showed moderate concentric plaque plaque with negative FFR in LAD.  No other disease.  4.  Dependent edema  5.  History carcinoma breast postradiation therapy to right chest.  No cardiotoxic drugs  6.  BMI 31    Recommendations  We will make no changes in her therapy.  I am satisfied with the LDL cholesterol I do not think we should pursue this further.  She did have mild to moderate coronary disease on her CT coronary angiogram in 2017.  Considering the history of radiation therapy should probably have repeat imaging such as a stress test in the next few years.  I do not think there is indication at this time.  Continue to follow-up annually       Vitals:    06/03/21 1609   BP: 110/76   BP Source: Arm, Left Upper   Pulse: 78   SpO2: 98%   PainSc: Zero   Weight: 94.8 kg (209 lb)   Height: 172.7 cm (5' 8)     Body mass index is 31.78  kg/m?.     Past Medical History  Patient Active Problem List    Diagnosis Date Noted   ? Fecal smearing 04/12/2021   ? Lichen sclerosus    ? Bilateral leg cramps 06/23/2020   ? Venous insufficiency of left leg 06/23/2020   ? Neuropathy 10/24/2019   ? Pelvic pain 08/08/2019   ? Gastroesophageal reflux disease 05/11/2017   ? Essential hypertension 03/02/2017   ? Thrombocytopenia (HCC) 02/16/2017   ? Coronary artery disease involving native coronary artery of native heart without angina pectoris 01/30/2017     08/2007 Exercise sestamibi MPI [place].  5.64min o9n TM, no CP. EF 59%, no ischemia  03/17  Ex Echo at [facility] - no ischemia, 6 min on TM, TDS, no CP, no ECG changes, EF normal  06/17  Coronary Artery AJ-130 Score: Total 234.  LAD 206, RCA 28  08/17 CT Cor angiogram at [facility] EF 56%, chambers and valves normal, LAD-moderate concentric flat plaque, FFR 0.86; LCx mild plaque, RCA normal.  Thoracic aorta normal.     ? 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 Teresa Trevino had no complaints prior to her screening mammogram. A new right breast asymmetry was present on screening mammogram. There are was suspicious on diagnostic imaging and biopsy was recommended. Right breast sono-guided biopsy 01/19/17 ([place]) revealed grade 3 invasive ductal carcinoma. Ms. Bostic underwent right RSL lumpectomy/SLNB on 07/06/17. She finished radiation with Dr. Ardeth Perfect on 10/05/17.    PATHOLOGY:  Tumor:  Size/Extent of Tumor Bed: 1.2 x 0.6 cm   Size/Extent of Residual Invasive Tumor: No invasive tumor identified   Overall Residual Cellularity of the Tumor Bed: <1%   1.5 mm DCIS present  Margins Free From Tumor:  Yes  ER:  negative   PR:  negative    Her 2:  negative  Grade:  3  Lymph Nodes:  0/4  LVSI:  no  Extranodal extension:  no      BREAST IMAGING:  Mammogram:    -- Bilateral screening mammogram 12/20/16 ([place]) revealed scattered fibroglandular densities. There was a focal asymmetry present within the central posterior right breast. Additional diagnostic images and ultrasound were recommended. No abnormalities were present on the left breast.  -- Right diagnostic mammogram 01/04/17 ([place]) revealed an approximately 1.2 cm x 0.6 cm x 0.5 cm low-density mass at 4:00 with associated calcifications. This was considered suspicious and further evaluation with ultrasound will be performed.    Ultrasound:    -- Targeted right breast ultrasound 01/04/17 () revealed at 4:00 there was an irregular hypoechoic mass measuring approximately 6 mm x 9 mm x 8 mm with associated microcalcifications. This was thought to likely correlate with the mammographic abnormality and was considered somewhat suspicious. Ultrasound-guided biopsy was recommended. If the clip marker form sono guided biopsy of this mass did not correlate to the mammographic finding, additional stereotactic biopsy might be required. These findings recommendations were discussed in detail with the patient at the time of today's procedure.     REPRODUCTIVE HEALTH:  Age at first Menarche: Unknown   Age at Menopause:  Hysterectomy/BSO at 45, HRT cream for several months  PROCEDURE: Right RSL lumpectomy/SLNB, 07/06/17  PERTINENT PMH:  HTN, chronic leukopenia  FAMILY HISTORY:  Maternal Grandmother- Breast cancer dx age late 65s  PHYSICAL EXAM on PRESENTATION: Right - No palpable breast masses. No skin, nipple, or areolar change. Left - No palpable breast masses. No skin, nipple, or areolar change. No supraclavicular or axillary adenopathy.   MEDICAL ONCOLOGY:  Dr. Olin Pia NEOADJUVANT THERAPY: Taxotere/carbo completed 06/01/17  REFERRED BY:  Efraim Kaufmann [place] PA       ? Nocturia 01/2017   ? Post-void dribbling 01/2017   ? Lichen sclerosus et atrophicus of the vulva 09/22/2015   ? Agatston coronary artery calcium score between 200 and 399 08/21/2015   ? Hypercholesterolemia 08/20/2015   ? Vulvar lesion 03/12/2015   ? Recurrent UTI 09/11/2014   ? Dyspareunia, female 05/13/2013   ? Vaginal atrophy 05/13/2013   ? Urge incontinence of urine 05/13/2013   ? Mixed incontinence urge and stress (female)(female) 10/19/2011         Review of Systems   Constitutional: Negative.   HENT: Negative.    Eyes: Negative.    Cardiovascular: Negative.    Respiratory: Negative.    Endocrine: Negative.    Hematologic/Lymphatic: Negative.    Skin: Negative.    Musculoskeletal: Negative.    Gastrointestinal: Negative.    Genitourinary: Negative.    Neurological: Negative. Psychiatric/Behavioral: Negative.    Allergic/Immunologic: Negative.        Physical Exam  General Appearance: no acute distress  Skin: warm, moist, no ulcers  HEENT: unremarkable  Neck Veins: neck veins are flat, neck veins are not distended  Carotid Arteries: normal carotid upstroke bilaterally, no bruits  Chest Inspection: chest is normal in appearance  Auscultation/Percussion: lungs clear to auscultation, no rales, rhonchi, or wheezing  Cardiac Rhythm: regular rhythm and normal rate  Cardiac Auscultation: Normal S1 & S2, no S3 or S4, no rub  Murmurs: no cardiac murmurs   Extremities: no lower extremity edema; 2+ symmetric distal pulses  Abdominal Exam: soft, non-tender, no masses, bowel sounds normal  Liver & Spleen: no organomegaly  Neurologic Exam: neurological assessment grossly intact         Cardiovascular Health Factors  Vitals BP Readings from Last 3 Encounters:   06/03/21 110/76   05/12/21 106/79   04/14/21 120/72     Wt Readings from Last 3 Encounters:   06/03/21 94.8 kg (209 lb)   05/12/21 95.7 kg (211 lb)   04/14/21 94.4 kg (208 lb 3.2 oz)     BMI Readings from Last 3 Encounters:   06/03/21 31.78 kg/m?   05/12/21 32.08 kg/m?   04/14/21 31.66 kg/m?      Smoking Social History     Tobacco Use   Smoking Status Never   Smokeless Tobacco Never      Lipid Profile Cholesterol   Date Value Ref Range Status   09/02/2020 131  Final     HDL   Date Value Ref Range Status   09/02/2020 46  Final     LDL   Date Value Ref Range Status   09/02/2020 64  Final     Triglycerides   Date Value Ref Range Status   09/02/2020 104  Final      Blood Sugar No results found for: HGBA1C  Glucose   Date Value Ref Range Status   09/02/2020 96  Final   02/04/2020 95  Final   06/18/2019 103  Final  Current Medications (including today's revisions)  ? cholecalciferol (vitamin D3) (OPTIMAL D3) 50,000 units capsule TAKE ONE CAPSULE BY MOUTH EVERY WEEK   ? clobetasoL (TEMOVATE) 0.05 % topical ointment Apply BID x 2-4 weeks and taper to TIW prn   ? estradioL (ESTRACE) 0.01 % (0.1 mg/g) vaginal cream Apply 0.5 gram to the urethral opening at night for 2 weeks then twice weekly   ? evolocumab (REPATHA SYRINGE) 140 mg/mL injectable SYRINGE Inject 1 mL under the skin every 14 days.   ? furosemide (LASIX) 20 mg tablet Take one tablet by mouth every 48 hours.   ? hyoscyamine sulfate (LEVSIN/SL) 0.125 mg sublingual tablet Place one tablet under tongue every 4 hours as needed for Other... (Bladder spasms, urgency, pelvic pain). Max 1.5 mg/ day   ? losartan (COZAAR) 50 mg tablet Take one tablet by mouth daily.   ? omeprazole DR (PRILOSEC) 20 mg capsule Take one capsule by mouth daily before breakfast.   ? phenazopyridine (PYRIDIUM) 200 mg tablet Take one tablet by mouth three times daily as needed for Pain. Max 3 days.   ? psyllium husk (METAMUCIL (WITH SUGAR)) 3.4 gram packet Take one packet by mouth twice daily.   ? rosuvastatin (CRESTOR) 5 mg tablet Take one tablet by mouth every 7 days.

## 2021-06-07 ENCOUNTER — Encounter: Admit: 2021-06-07 | Discharge: 2021-06-07 | Payer: MEDICARE

## 2021-06-09 ENCOUNTER — Encounter: Admit: 2021-06-09 | Discharge: 2021-06-09 | Payer: MEDICARE

## 2021-06-09 NOTE — Telephone Encounter
-----   Message from Roena Malady, MD sent at 06/08/2021 11:12 PM CDT -----  Labs look good.  I encouraged Clifton and her husband to establish with Dr. Nadara Eaton in the [facility] office so they do not have to drive so far.  Can you facilitate this for them please    ----- Message -----  From: Baldwin Crown, RN  Sent: 06/07/2021   8:29 AM CDT  To: Roena Malady, MD    Labs for your review, LDL 66 - pt is on Repatha and crestor '5mg'$  daily.  Please let myself or the [facility] RNs know if you have any recommendations.  Thank you!

## 2021-06-09 NOTE — Telephone Encounter
Called and discussed results and recommendations.  Set up follow up in the [place] office.

## 2021-06-11 ENCOUNTER — Encounter: Admit: 2021-06-11 | Discharge: 2021-06-11 | Payer: MEDICARE

## 2021-07-02 ENCOUNTER — Encounter: Admit: 2021-07-02 | Discharge: 2021-07-02 | Payer: MEDICARE

## 2021-07-02 DIAGNOSIS — E785 Hyperlipidemia, unspecified: Secondary | ICD-10-CM

## 2021-07-02 DIAGNOSIS — Z923 Personal history of irradiation: Secondary | ICD-10-CM

## 2021-07-02 DIAGNOSIS — R32 Unspecified urinary incontinence: Secondary | ICD-10-CM

## 2021-07-02 DIAGNOSIS — R151 Fecal smearing: Secondary | ICD-10-CM

## 2021-07-02 DIAGNOSIS — D693 Immune thrombocytopenic purpura: Secondary | ICD-10-CM

## 2021-07-02 DIAGNOSIS — K449 Diaphragmatic hernia without obstruction or gangrene: Secondary | ICD-10-CM

## 2021-07-02 DIAGNOSIS — N3941 Urge incontinence: Secondary | ICD-10-CM

## 2021-07-02 DIAGNOSIS — Z0389 Encounter for observation for other suspected diseases and conditions ruled out: Secondary | ICD-10-CM

## 2021-07-02 DIAGNOSIS — C4490 Unspecified malignant neoplasm of skin, unspecified: Secondary | ICD-10-CM

## 2021-07-02 DIAGNOSIS — L900 Lichen sclerosus et atrophicus: Secondary | ICD-10-CM

## 2021-07-02 DIAGNOSIS — I10 Essential (primary) hypertension: Secondary | ICD-10-CM

## 2021-07-02 DIAGNOSIS — R1310 Dysphagia, unspecified: Secondary | ICD-10-CM

## 2021-07-02 DIAGNOSIS — N904 Leukoplakia of vulva: Secondary | ICD-10-CM

## 2021-07-02 DIAGNOSIS — C50919 Malignant neoplasm of unspecified site of unspecified female breast: Secondary | ICD-10-CM

## 2021-07-02 DIAGNOSIS — D72819 Decreased white blood cell count, unspecified: Secondary | ICD-10-CM

## 2021-07-02 DIAGNOSIS — K561 Intussusception: Secondary | ICD-10-CM

## 2021-07-02 DIAGNOSIS — I2510 Atherosclerotic heart disease of native coronary artery without angina pectoris: Secondary | ICD-10-CM

## 2021-07-02 DIAGNOSIS — N390 Urinary tract infection, site not specified: Secondary | ICD-10-CM

## 2021-07-02 DIAGNOSIS — K219 Gastro-esophageal reflux disease without esophagitis: Secondary | ICD-10-CM

## 2021-07-02 DIAGNOSIS — U071 COVID-19: Secondary | ICD-10-CM

## 2021-07-02 DIAGNOSIS — Z9289 Personal history of other medical treatment: Secondary | ICD-10-CM

## 2021-07-02 DIAGNOSIS — Z9221 Personal history of antineoplastic chemotherapy: Secondary | ICD-10-CM

## 2021-07-02 NOTE — Progress Notes
Name: Teresa Trevino          MRN: [id number]      DOB: 1956-02-10      AGE: 66 y.o.   DATE OF SERVICE: 07/02/2021    Subjective:             Reason for Visit:  Follow Up      Teresa Trevino is a 66 y.o. female.      Cancer Staging   Malignant neoplasm of lower-inner quadrant of right breast of female, estrogen receptor negative (HCC)  Staging form: Breast, AJCC 8th Edition  - Clinical stage from 01/19/2017: Stage IB (cT1b, cN0, cM0, G3, ER-, PR-, HER2-) - Signed by Dimas Alexandria, PA-C on 01/24/2017      History of Present Illness   Teresa Trevino is a 66 y.o. female who is presenting for evaluation of fecal incontinence. She has a history of one vaginal delivery. Denies any significant trauma associated with this. She has a previous history of hysterectomy complicated by bladder injury. She has a history of urinary incontinence and urgency and previously tried PFPT for it and is unsure if this has helped that much. Had a bladder sling which helped some with this. Most recently she is having problems with fecal incontinence. It can be as much as 3-4 episodes per week. Sometimes she cannot feel like she needs to defecate but will find her underwear soiled. This has been happening particularly with exercising. Denies blood in her stools. Does not feel any tissue protrusion. States that bowel movement consistency is soft and denies straining or constipation.     07/02/2021 - Interval history  Teresa Trevino returns today for follow up of her fecal incontinence following 3 months of PFPT. She has had improvement in her fecal incontinence and is no longer soiling her underwear. She does still have sensations of incomplete emptying and will occasionally need to return to the toilet to finish the bowel movement. Her main concern today are her urinary symptoms with burning, frequency, and incontinence and she is scheduled to see a urogynecologist on 6/20.    04/08/2021 - MRI Def:   1. ?Initially, there was paradoxical contraction of the puborectalis   muscle during defecation before the anorectal angle finally opened. There   is also immediate incontinence of a portion of the administered rectal   contrast.   2. ?Anterior compartment: ?Urethral hypermobility. Mild cystocele.   3. ?Middle compartment: ?Mild vaginal prolapse.   4. ?Posterior compartment: ?Full-thickness intra-anal rectal   intussusception. Small anterior rectocele. Mild peritoneocele.   5. ?Incidental note is made of a subcentimeter periurethral cyst. Though   incompletely characterized, this likely represents a Skene's cyst.   6. ?Moderate sigmoid colon diverticulosis   ?  03/12/2021 - ARM:  This was low.  Anal pressure during squeeze with an adequate.  Rectoanal inhibitory reflex is present.  Rectal sensation for sensation were normal.  During simulated evacuation (maneuver #1), increase in intrarectal pressure was normal, percent anal relaxation was near normal, residual anal pressure was high, and the rectoanal pressure gradient was near normal.  The rectal balloon expulsion test was abnormal normal.  In summary, there is findings to support the low resting and squeeze pressure during maneuvers.  A trial of biofeedback/pelvic floor rehabilitation recommended.  If clinically relevant and anal EUS to evaluate anal sphincters integrity recommended.  ?  02/16/2021 - GI:  1. Fecal smearing  - Increase fiber to twice daily  - ARM to evaluate  muscle strength  - MRI defecography to evaluate for pelvic floor dyssynergia and presence of cystocele d/t urinary urgency and s/p hysterectomy  - Referral to colorectal surgery for possible intervention for unintentional passage of stool  - psyllium husk (METAMUCIL (WITH SUGAR)) 3.4 gram packet; Take one packet by mouth twice daily. ?Dispense: 60 each; Refill: 3  - MRI DEFECOGRAPHY; Future  - AMB REFERRAL TO COLORECTAL SURGERY  ?  2. Anal fissure  - OK to continue OTC hydrocortisone cream  - Increase fiber  - May use preparation H suppositories if bleeding worsens  - Refer to colorectal surgery for possible EUA  - AMB REFERRAL TO COLORECTAL SURGERY  ?  02/01/2018 - Colonoscopy:  The Colonoscope was   ? ? ?introduced through the anus and advanced to the cecum, identified by   ? ? ?appendiceal orifice and ileocecal valve. The colonoscopy was performed   ? ? ?without difficulty. The patient tolerated the procedure well. The   ? ? ?quality of the bowel preparation was good. The ileocecal valve,   ? ? ?appendiceal orifice, and rectum were photographed.   Findings:   ? ? ?A few medium-mouthed diverticula were found in the sigmoid colon.   ? ? ?The exam was otherwise normal throughout the examined colon.   ? ? ?Retroflexion in the rectum was not performed due to anatomy (short   ? ? ?rectum).   Impression: ? ? ? ? ? ? ? ? ? - Diverticulosis in the sigmoid colon.   ? ? ? ? ? ? ? ? ? ? ? ? ? ? ? - Otherwise, normal colon.   ? ? ? ? ? ? ? ? ? ? ? ? ? ? ? - No specimens collected.   Pathology:  A. Gastric mucosa, gastric polyps, biopsy:   Fundic gland polyps.     B. Gastric mucosa, gastric biopsies r/o H. pylori, biopsy:   Mild chronic inactive gastritis.   No H. pylori-like organisms are identified on H and E sections.          Review of Systems   Constitutional: Negative.    HENT: Negative.    Eyes: Negative.    Respiratory: Negative.    Cardiovascular: Negative.    Gastrointestinal:        Fecal incontinence    Endocrine: Negative.    Genitourinary: Positive for urgency.   Musculoskeletal: Negative.    Skin: Negative.    Allergic/Immunologic: Negative.    Neurological: Negative.    Hematological: Negative.    Psychiatric/Behavioral: Negative.      Objective:        Medical History:   Diagnosis Date   ? Breast cancer (HCC) 01/2017    right IDC   ? Chronic leukopenia    ? Coronary artery disease    ? COVID-19 10/2018   ? Difficulty swallowing     past issue during chemotherapy-no longer an issue per pt   ? Dyslipidemia    ? GERD (gastroesophageal reflux disease)    ? Hiatal hernia    ? History of blood transfusion     during chemo therapy   ? History of chemotherapy    ? History of EKG 01/2017   ? History of radiation therapy    ? Hyperlipemia 08/20/2015   ? Hypertension    ? Incontinence    ? 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 Lichen sclerosus    ? Lichen sclerosus et  atrophicus of the vulva 09/22/2015   ? Observation for suspected cardiovascular disease 08/20/2015    04/21/15  Stress Echo :  1.  No exercise induced chest pain.  2.  No ECG evidence of ischemia.  3.  No echocardiographic evidence of ischemia.  4.  Normal  Hemodynamic response to exercise.  5.  No significant exercise induced arrhythmias.  6.  Low risk scan.  7.  Consider medical management if indicated.   ? Skin cancer    ? Urinary tract infection      Surgical History:   Procedure Laterality Date   ? TONSILLECTOMY  1977   ? HX HYSTERECTOMY  2008   ? BLADDER REPAIR  2008    [facility]   ? BLADDER SURGERY  2013    bladder sling   ? CT ANGIOGRAM (NON-INVASIVE)  09/2015   ? UPPER GASTROINTESTINAL ENDOSCOPY  05/26/2017   ? RIGHT RADIOACTIVE SEED LOCALIZED LUMPECTOMY Right 07/06/2017    Performed by Ruthy Dick, DO at [facility] OR   ? IDENTIFICATION SENTINEL LYMPH NODE Right 07/06/2017    Performed by Ruthy Dick, DO at [facility] OR   ? INJECTION RADIOACTIVE TRACER FOR SENTINEL NODE IDENTIFICATION Right 07/06/2017    Performed by Ruthy Dick, DO at [facility] OR   ? SENTINEL LYMPH NODE BIOPSY Right 07/06/2017    Performed by Ruthy Dick, DO at [facility] OR   ? COLONOSCOPY DIAGNOSTIC WITH SPECIMEN COLLECTION BY BRUSHING/ WASHING - FLEXIBLE N/A 02/01/2018    Performed by Virgina Organ, MD at [facility] OR   ? ESOPHAGOGASTRODUODENOSCOPY WITH BIOPSY - FLEXIBLE N/A 02/01/2018    Performed by Virgina Organ, MD at [facility] OR   ? ANORECTAL MANOMETRY N/A 03/12/2021    Performed by Eliott Nine, MD at [facility] ENDO   ? COLONOSCOPY     ? FOOT SURGERY       Family History   Problem Relation Age of Onset   ? Diabetes Mother    ? Hypertension Mother    ? High Cholesterol Mother    ? Stroke Mother    ? Thyroid Disease Mother    ? Kidney Failure Mother         on HD   ? Diabetes Father         since age 54   ? Heart Failure Father    ? Diabetes Paternal Uncle    ? Cancer Maternal Grandmother    ? Diabetes Maternal Grandfather    ? None Reported Sister    ? Melanoma Neg Hx      Social History     Socioeconomic History   ? Marital status: Married   Tobacco Use   ? Smoking status: Never   ? Smokeless tobacco: Never   Substance and Sexual Activity   ? Alcohol use: Yes     Alcohol/week: 4.0 standard drinks of alcohol     Types: 2 Shots of liquor, 2 Standard drinks or equivalent per week     Comment: 2 drinks per week   ? Drug use: No   ? Sexual activity: Not Currently     Partners: Male                 Medication    ? cholecalciferol (vitamin D3) (OPTIMAL D3) 50,000 units capsule TAKE ONE CAPSULE BY MOUTH EVERY WEEK   ? clobetasoL (TEMOVATE) 0.05 % topical ointment Apply BID x 2-4 weeks and taper to TIW prn   ? estradioL (ESTRACE) 0.01 % (  0.1 mg/g) vaginal cream Apply 0.5 gram to the urethral opening at night for 2 weeks then twice weekly   ? evolocumab (REPATHA SYRINGE) 140 mg/mL injectable SYRINGE Inject 1 mL under the skin every 14 days.   ? furosemide (LASIX) 20 mg tablet Take one tablet by mouth every 48 hours.   ? hyoscyamine sulfate (LEVSIN/SL) 0.125 mg sublingual tablet Place one tablet under tongue every 4 hours as needed for Other... (Bladder spasms, urgency, pelvic pain). Max 1.5 mg/ day   ? losartan (COZAAR) 50 mg tablet Take one tablet by mouth daily.   ? omeprazole DR (PRILOSEC) 20 mg capsule Take one capsule by mouth daily before breakfast.   ? phenazopyridine (PYRIDIUM) 200 mg tablet Take one tablet by mouth three times daily as needed for Pain. Max 3 days.   ? psyllium husk (METAMUCIL (WITH SUGAR)) 3.4 gram packet Take one packet by mouth twice daily.   ? rosuvastatin (CRESTOR) 5 mg tablet Take one tablet by mouth every 7 days.     Vitals:    07/02/21 1128   PainSc: Zero     There is no height or weight on file to calculate BMI.     Pain Score: Zero       Fatigue Scale: 0-None       Physical Exam  HENT:      Head: Normocephalic.      Nose: Nose normal.      Mouth/Throat:      Mouth: Mucous membranes are moist.   Eyes:      Extraocular Movements: Extraocular movements intact.   Cardiovascular:      Rate and Rhythm: Normal rate.   Pulmonary:      Effort: Pulmonary effort is normal.   Abdominal:      Palpations: Abdomen is soft.   Musculoskeletal:         General: Normal range of motion.      Cervical back: Normal range of motion.   Skin:     General: Skin is warm.      Capillary Refill: Capillary refill takes less than 2 seconds.   Neurological:      General: No focal deficit present.      Mental Status: She is alert.   Psychiatric:         Mood and Affect: Mood normal.               Assessment and Plan:  Teresa Trevino is a 66 y.o. female with fecal incontinence and lichen sclerosis s/p PFPT.    - Her symptoms have improved significantly with PFPT. She will continue the exercises and follow up with [place] without surgery for now.   - We will see her back in 3 months     Urinary incontinence:  urogyn planning for evaluation, we have placed an internal referral as well.      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

## 2021-07-22 ENCOUNTER — Encounter: Admit: 2021-07-22 | Discharge: 2021-07-22 | Payer: MEDICARE

## 2021-07-26 ENCOUNTER — Encounter: Admit: 2021-07-26 | Discharge: 2021-07-26 | Payer: MEDICARE

## 2021-07-26 ENCOUNTER — Ambulatory Visit: Admit: 2021-07-26 | Discharge: 2021-07-27 | Payer: MEDICARE

## 2021-07-26 DIAGNOSIS — Z9221 Personal history of antineoplastic chemotherapy: Secondary | ICD-10-CM

## 2021-07-26 DIAGNOSIS — D693 Immune thrombocytopenic purpura: Secondary | ICD-10-CM

## 2021-07-26 DIAGNOSIS — U071 COVID-19: Secondary | ICD-10-CM

## 2021-07-26 DIAGNOSIS — I10 Essential (primary) hypertension: Secondary | ICD-10-CM

## 2021-07-26 DIAGNOSIS — R3989 Other symptoms and signs involving the genitourinary system: Secondary | ICD-10-CM

## 2021-07-26 DIAGNOSIS — Z0389 Encounter for observation for other suspected diseases and conditions ruled out: Secondary | ICD-10-CM

## 2021-07-26 DIAGNOSIS — E785 Hyperlipidemia, unspecified: Secondary | ICD-10-CM

## 2021-07-26 DIAGNOSIS — K219 Gastro-esophageal reflux disease without esophagitis: Secondary | ICD-10-CM

## 2021-07-26 DIAGNOSIS — Z9289 Personal history of other medical treatment: Secondary | ICD-10-CM

## 2021-07-26 DIAGNOSIS — C50919 Malignant neoplasm of unspecified site of unspecified female breast: Secondary | ICD-10-CM

## 2021-07-26 DIAGNOSIS — N904 Leukoplakia of vulva: Secondary | ICD-10-CM

## 2021-07-26 DIAGNOSIS — I2510 Atherosclerotic heart disease of native coronary artery without angina pectoris: Secondary | ICD-10-CM

## 2021-07-26 DIAGNOSIS — D72819 Decreased white blood cell count, unspecified: Secondary | ICD-10-CM

## 2021-07-26 DIAGNOSIS — R1310 Dysphagia, unspecified: Secondary | ICD-10-CM

## 2021-07-26 DIAGNOSIS — Z923 Personal history of irradiation: Secondary | ICD-10-CM

## 2021-07-26 DIAGNOSIS — K449 Diaphragmatic hernia without obstruction or gangrene: Secondary | ICD-10-CM

## 2021-07-26 DIAGNOSIS — N390 Urinary tract infection, site not specified: Secondary | ICD-10-CM

## 2021-07-26 DIAGNOSIS — L900 Lichen sclerosus et atrophicus: Secondary | ICD-10-CM

## 2021-07-26 DIAGNOSIS — C4490 Unspecified malignant neoplasm of skin, unspecified: Secondary | ICD-10-CM

## 2021-07-26 DIAGNOSIS — R32 Unspecified urinary incontinence: Secondary | ICD-10-CM

## 2021-07-26 LAB — URINALYSIS, MICROSCOPIC

## 2021-07-26 MED ORDER — MYRBETRIQ 50 MG PO TB24
50 mg | ORAL_TABLET | Freq: Every day | ORAL | 3 refills | Status: AC
Start: 2021-07-26 — End: ?

## 2021-07-26 MED ORDER — CLOBETASOL 0.05 % TP OINT
1 refills
Start: 2021-07-26 — End: ?

## 2021-07-26 NOTE — Patient Instructions
Thank you for visiting the [facility].   We strive to provide personalized and compassionate care.  We look forward to taking care of you!        The Urogynecology Team,     Providers:       Acie Fredrickson MD, Robbie Lis MD, [facility] PA-C, and Alexander PA-C     Nurses:       Oneida Alar RN, [facility] LPN, [facility] RN, and Waldemar Dickens RN       How to contact us:  Please send a MyChart message to Dr. Randell Patient or call us directly at [phone] for medical questions or future scheduling needs.        Urogynecology is a surgical subspecialty requiring Dr. Enis Gash and Dr. Randell Patient to be in the OR several days of the week.  During these days the nursing staff and physician assistants are available for phone calls and questions. Dr. Randell Patient is in the office seeing patients in clinic on Mondays, alternating Thursdays, and alternating Fridays. Due to our clinic volume, our entire staff is providing patient care with various providers every day.  Urgent messages will be returned.  Non-urgent calls will be addressed by the end of the next business day.  Messages left after 1:00 pm may not be answered until the following business day.    For any emergency outside of normal business hours, please call [phone] and request to have the on-call Gynecologist paged.    How to get a medication refill:   Please request refills through your pharmacy or use the MyChart Refill request. If you have any questions or need further assistance, please call our office [phone]).     How to get approval for a medication insurance is denying:   If a medication is prescribed and insurance is denying coverage for the drug, you have the option of paying for the drug out of pocket or contacting our office to discuss an alternative.  Good Rx (www.goodrx.com) is an online site that can save up to 80% off of your medication cost.      Who do I talk to if I have questions about my bill?  If you need to reach our Billing Department for questions about your bill, call [phone].      How to receive your test results: Results will be directly available in MyChart once finalized. Urine cultures may take up to 72 hours.  OneSwab vaginal cultures, blood work, and pathology can take up to 2 weeks.  Please call our office [phone] if you have not received your results within this time frame or if you have questions about your result.    Scheduling:  Our scheduling phone number is [phone].    Communication preferences can be managed in MyChart to ensure you receive important appointment notifications  ________________________________________________________________________    We have three locations where we see our patients.  Be sure to check your next appointment location.   [facility]    [address]    [place], [place] [address]     MAIN CAMPUS/ MOB ([facility] Office Building at the [facility])    [address]    [place], [place] [address]      [facility] OFFICE    [address], [address]    [place], [place]     _________________________________________________________________________

## 2021-07-27 ENCOUNTER — Encounter: Admit: 2021-07-27 | Discharge: 2021-07-27 | Payer: MEDICARE

## 2021-07-27 DIAGNOSIS — I10 Essential (primary) hypertension: Secondary | ICD-10-CM

## 2021-07-27 DIAGNOSIS — N904 Leukoplakia of vulva: Secondary | ICD-10-CM

## 2021-07-27 DIAGNOSIS — Z9221 Personal history of antineoplastic chemotherapy: Secondary | ICD-10-CM

## 2021-07-27 DIAGNOSIS — D693 Immune thrombocytopenic purpura: Secondary | ICD-10-CM

## 2021-07-27 DIAGNOSIS — Z923 Personal history of irradiation: Secondary | ICD-10-CM

## 2021-07-27 DIAGNOSIS — N390 Urinary tract infection, site not specified: Secondary | ICD-10-CM

## 2021-07-27 DIAGNOSIS — R1310 Dysphagia, unspecified: Secondary | ICD-10-CM

## 2021-07-27 DIAGNOSIS — K219 Gastro-esophageal reflux disease without esophagitis: Secondary | ICD-10-CM

## 2021-07-27 DIAGNOSIS — I2510 Atherosclerotic heart disease of native coronary artery without angina pectoris: Secondary | ICD-10-CM

## 2021-07-27 DIAGNOSIS — Z9289 Personal history of other medical treatment: Secondary | ICD-10-CM

## 2021-07-27 DIAGNOSIS — L900 Lichen sclerosus et atrophicus: Secondary | ICD-10-CM

## 2021-07-27 DIAGNOSIS — R32 Unspecified urinary incontinence: Secondary | ICD-10-CM

## 2021-07-27 DIAGNOSIS — E785 Hyperlipidemia, unspecified: Secondary | ICD-10-CM

## 2021-07-27 DIAGNOSIS — K449 Diaphragmatic hernia without obstruction or gangrene: Secondary | ICD-10-CM

## 2021-07-27 DIAGNOSIS — C50919 Malignant neoplasm of unspecified site of unspecified female breast: Secondary | ICD-10-CM

## 2021-07-27 DIAGNOSIS — U071 COVID-19: Secondary | ICD-10-CM

## 2021-07-27 DIAGNOSIS — Z0389 Encounter for observation for other suspected diseases and conditions ruled out: Secondary | ICD-10-CM

## 2021-07-27 DIAGNOSIS — N3946 Mixed incontinence: Secondary | ICD-10-CM

## 2021-07-27 DIAGNOSIS — C4490 Unspecified malignant neoplasm of skin, unspecified: Secondary | ICD-10-CM

## 2021-07-27 DIAGNOSIS — D72819 Decreased white blood cell count, unspecified: Secondary | ICD-10-CM

## 2021-07-27 MED ORDER — ESTRADIOL 0.01 % (0.1 MG/GRAM) VA CREA
1 g | VAGINAL | 11 refills | 30.00000 days | Status: AC
Start: 2021-07-27 — End: ?

## 2021-07-28 ENCOUNTER — Encounter: Admit: 2021-07-28 | Discharge: 2021-07-28 | Payer: MEDICARE

## 2021-07-28 DIAGNOSIS — R3989 Other symptoms and signs involving the genitourinary system: Secondary | ICD-10-CM

## 2021-07-28 MED ORDER — CEFDINIR 300 MG PO CAP
300 mg | ORAL_CAPSULE | Freq: Two times a day (BID) | ORAL | 0 refills | 7.00000 days | Status: AC
Start: 2021-07-28 — End: ?

## 2021-07-28 NOTE — Telephone Encounter
-----   Message from Chancy Hurter, PA-C sent at 07/28/2021  1:53 PM CDT -----  Positive urine culture. Please contact patient with results.  RX: Cefdinir '300mg'$  BID x 7 days PO  Rx has been sent.  Thank you.           -- Bactrim [Sulfamethoxazole-Trimethoprim] -- NAUSEA AND VOMITING    --  "Makes me feel ill"   -- Erythromycin -- NAUSEA AND VOMITING   -- Macrobid [Nitrofurantoin Monohyd/M-Cryst] -- NAUSEA AND VOMITING   -- Sulfa (Sulfonamide Antibiotics) -- NAUSEA AND VOMITING        Lab Results       Component                Value               Date/Time                  CR                       0.80                09/02/2020 12:00 AM

## 2021-07-28 NOTE — Telephone Encounter
Spoke to pt. Relayed results and tx recs. Pt just picked up [facility] and will start it. Discussed culture result. Pt will take abx with food. Encouraged pt to drink plenty of water as well. Pt was appreciative.

## 2021-08-04 ENCOUNTER — Encounter: Admit: 2021-08-04 | Discharge: 2021-08-04 | Payer: MEDICARE

## 2021-08-11 ENCOUNTER — Encounter: Admit: 2021-08-11 | Discharge: 2021-08-11 | Payer: MEDICARE

## 2021-08-11 DIAGNOSIS — I872 Venous insufficiency (chronic) (peripheral): Secondary | ICD-10-CM

## 2021-08-11 DIAGNOSIS — N904 Leukoplakia of vulva: Secondary | ICD-10-CM

## 2021-08-11 DIAGNOSIS — I2510 Atherosclerotic heart disease of native coronary artery without angina pectoris: Secondary | ICD-10-CM

## 2021-08-11 DIAGNOSIS — C4490 Unspecified malignant neoplasm of skin, unspecified: Secondary | ICD-10-CM

## 2021-08-11 DIAGNOSIS — R32 Unspecified urinary incontinence: Secondary | ICD-10-CM

## 2021-08-11 DIAGNOSIS — I10 Essential (primary) hypertension: Secondary | ICD-10-CM

## 2021-08-11 DIAGNOSIS — D696 Thrombocytopenia, unspecified: Secondary | ICD-10-CM

## 2021-08-11 DIAGNOSIS — N390 Urinary tract infection, site not specified: Secondary | ICD-10-CM

## 2021-08-11 DIAGNOSIS — K449 Diaphragmatic hernia without obstruction or gangrene: Secondary | ICD-10-CM

## 2021-08-11 DIAGNOSIS — Z0389 Encounter for observation for other suspected diseases and conditions ruled out: Secondary | ICD-10-CM

## 2021-08-11 DIAGNOSIS — C50919 Malignant neoplasm of unspecified site of unspecified female breast: Secondary | ICD-10-CM

## 2021-08-11 DIAGNOSIS — D72819 Decreased white blood cell count, unspecified: Secondary | ICD-10-CM

## 2021-08-11 DIAGNOSIS — R1310 Dysphagia, unspecified: Secondary | ICD-10-CM

## 2021-08-11 DIAGNOSIS — E785 Hyperlipidemia, unspecified: Secondary | ICD-10-CM

## 2021-08-11 DIAGNOSIS — L900 Lichen sclerosus et atrophicus: Secondary | ICD-10-CM

## 2021-08-11 DIAGNOSIS — K219 Gastro-esophageal reflux disease without esophagitis: Secondary | ICD-10-CM

## 2021-08-11 DIAGNOSIS — N952 Postmenopausal atrophic vaginitis: Secondary | ICD-10-CM

## 2021-08-11 DIAGNOSIS — C50311 Malignant neoplasm of lower-inner quadrant of right female breast: Secondary | ICD-10-CM

## 2021-08-11 NOTE — Progress Notes
08/11/2021    Name: Teresa Trevino  DOB: 1955/08/02  MRN: [id number]    Primary Care Physician: Rockwell Germany   Surgeon: Dr. Ruthy Dick  Radiation oncologist: Dr. Nash Dimmer     Chief Complaint:   Chief Complaint   Patient presents with   ? Heme/Onc Care     Hematology/Oncology History:   Cancer Staging   Malignant neoplasm of lower-inner quadrant of right breast of female, estrogen receptor negative (HCC)  Staging form: Breast, AJCC 8th Edition  - Clinical stage from 01/19/2017: Stage IB (cT1b, cN0, cM0, G3, ER-, PR-, HER2-) - Signed by Dimas Alexandria, PA-C on 01/24/2017    1.  History of chronic intermittent leukopenia and thrombocytopenia, clinically insignificant.  May have autoimmune component and appeared to be her baseline.  However, labs in 01/2017 were unremarkable.  2.  November 2018: Noted on screening mammogram to have 1.2 cm abnormality in the right breast.  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  3.  06/01/2017: Completed neoadjuvant carboplatin/docetaxel x6 with complete response obtained on breast MRI and ultrasound.  After cycle 1, decreased by 20% due to significant toxicity.  4.  07/06/2017: Lumpectomy (Dr. Loreta Ave) showed complete response with no active malignancy.  0/4 lymph nodes involved.  5.  10/05/2017: Completed radiation (Dr. Ardeth Perfect)  6.  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  7.  Current plan: Observation    EGD/colonoscopy 01/2018 ([place]) negative, 10-year follow-up recommended  Mammogram 04/2021 (): Negative    Interval Events  Teresa Trevino presents to the clinic today unaccompanied for follow-up of her breast cancer.  She has been following with surgical oncology and urogynecology regarding recurrent UTIs, vaginal dryness, lichen sclerosis, fecal smearing, rectal intussusception.  Received great benefit from pelvic floor physical therapy.  Will be following up with gynecology soon.  Using vaginal estrogen cream.    Medical history  Teresa Trevino has a past medical history of Breast cancer (HCC) (01/2017) (right IDC), Chronic leukopenia, Coronary artery disease, Difficulty swallowing (past issue during chemotherapy-no longer an issue per pt), Dyslipidemia, GERD (gastroesophageal reflux disease), Hiatal hernia, Hyperlipemia (08/20/2015), Hypertension, Incontinence, Lichen sclerosus, Lichen sclerosus et atrophicus of the vulva (09/22/2015), Observation for suspected cardiovascular disease (08/20/2015) (04/21/15  Stress Echo :  1.  No exercise induced chest pain.  2.  No ECG evidence of ischemia.  3.  No echocardiographic evidence of ischemia.  4.  Normal  Hemodynamic response to exercise.  5.  No significant exercise induced arrhythmias.  6.  Low risk scan.  7.  Consider medical management if indicated.), Skin cancer (2023) (SCC left shin), Thrombocytopenia (HCC) (following chemo), and Urinary tract infection.    She has a past surgical history that includes hysterectomy (2008); colonoscopy; Foot surgery; ct angiogram (non-invasive) (09/2015); Upper gastrointestinal endoscopy (05/26/2017); tonsillectomy (1977); Mastectomy, partial (Right, 07/06/2017) (RIGHT RADIOACTIVE SEED LOCALIZED LUMPECTOMY performed by Ruthy Dick, DO at [facility] OR/PERIOP); lymph node biopsy (Right, 07/06/2017) (SENTINEL LYMPH NODE BIOPSY performed by Ruthy Dick, DO at [facility] OR/PERIOP); Colonoscopy (N/A, 02/01/2018) (COLONOSCOPY DIAGNOSTIC WITH SPECIMEN COLLECTION BY BRUSHING/ WASHING - FLEXIBLE performed by Virgina Organ, MD at [facility] OR/PERIOP); Upper gastrointestinal endoscopy (N/A, 02/01/2018) (ESOPHAGOGASTRODUODENOSCOPY WITH BIOPSY - FLEXIBLE performed by Virgina Organ, MD at [facility] OR/PERIOP); Bladder surgery (2013) (bladder sling); and bladder repair (2008) [facility]).    Teresa Trevino's family history includes Cancer in her maternal grandmother; Diabetes in her father,  maternal grandfather, mother, and paternal uncle; Heart Failure in her father; High Cholesterol in her mother; Hypertension in her mother; Kidney Failure in her mother; None Reported in her sister; Stroke in her mother; Thyroid Disease in her mother.    Social history: Married.  Her husband is a semiretired Education officer, community who is president of the [place] [employer].  They have a condo in [place] which they enjoy and also rent out.  She has never been a smoker.  She rarely drinks alcohol, no drug use.        Medications    Current Outpatient Medications:   ?  cholecalciferol (vitamin D3) (OPTIMAL D3) 50,000 units capsule, TAKE ONE CAPSULE BY MOUTH EVERY WEEK, Disp: , Rfl:   ?  clobetasoL (TEMOVATE) 0.05 % topical ointment, apply topically TO affected AREA TWICE DAILY FOR 2-4 WEEKS THEN taper TO THREE TIMES A WEEK AS NEEDED, Disp: 30 g, Rfl: 1  ?  estradioL (ESTRACE) 0.01 % (0.1 mg/g) vaginal cream, Insert or Apply one g to vaginal area twice weekly., Disp: 42.5 g, Rfl: 11  ?  evolocumab (REPATHA SYRINGE) 140 mg/mL injectable SYRINGE, Inject 1 mL under the skin every 14 days., Disp: 2 mL, Rfl: 11  ?  furosemide (LASIX) 20 mg tablet, Take one tablet by mouth every 48 hours., Disp: 90 tablet, Rfl: 3  ?  hyoscyamine sulfate (LEVSIN/SL) 0.125 mg sublingual tablet, Place one tablet under tongue every 4 hours as needed for Other... (Bladder spasms, urgency, pelvic pain). Max 1.5 mg/ day, Disp: 30 tablet, Rfl: 3  ?  losartan (COZAAR) 50 mg tablet, Take one tablet by mouth daily., Disp: , Rfl:   ?  mirabegron (MYRBETRIQ) 50 mg ER tablet, Take one tablet by mouth daily., Disp: 30 tablet, Rfl: 3  ?  omeprazole DR (PRILOSEC) 20 mg capsule, Take one capsule by mouth daily before breakfast., Disp: 90 capsule, Rfl: 3  ?  phenazopyridine (PYRIDIUM) 200 mg tablet, Take one tablet by mouth three times daily as needed for Pain. Max 3 days., Disp: 6 tablet, Rfl: 0  ?  psyllium husk (METAMUCIL (WITH SUGAR)) 3.4 gram packet, Take one packet by mouth twice daily., Disp: 60 each, Rfl: 3  ?  rosuvastatin (CRESTOR) 5 mg tablet, Take one tablet by mouth every 7 days., Disp: 12 tablet, Rfl: 3     Allergies:   Allergies   Allergen Reactions   ? Bactrim [Sulfamethoxazole-Trimethoprim] NAUSEA AND VOMITING     Makes me feel ill   ? Erythromycin NAUSEA AND VOMITING   ? Macrobid [Nitrofurantoin Monohyd/M-Cryst] NAUSEA AND VOMITING   ? Sulfa (Sulfonamide Antibiotics) NAUSEA AND VOMITING       Review of Systems  Review of Systems   Constitutional: Negative.    HENT: Negative.    Eyes: Negative.    Respiratory: Negative.    Cardiovascular: Negative.    Gastrointestinal: Negative.         Fecal incontinence   Endocrine: Negative.    Genitourinary: Positive for pelvic pain and vaginal pain.        Fecal smearing  Urinary incontinence  Recurrent UTI   Musculoskeletal: Negative.    Skin: Negative.    Allergic/Immunologic: Negative.    Neurological: Negative.    Hematological: Negative.    Psychiatric/Behavioral: Negative.    All other systems reviewed and are negative.    [place] Cooperative Oncology Group performance status is 0, Fully active, able to carry on all pre-disease performance without restriction.    Physical Exam  Vitals:    08/11/21 1311   BP: 131/68   BP Source: Arm, Left Upper   Pulse: 72   Temp: 36.8 ?C (98.2 ?F)   Resp: 18   SpO2: 98%   TempSrc: Temporal   PainSc: Zero   Weight: 93.5 kg (206 lb 3.2 oz)      Physical Exam  Vitals reviewed. Exam conducted with a chaperone present.   Constitutional:       General: She is not in acute distress.     Appearance: She is not ill-appearing.   Eyes:      General: No scleral icterus.  Cardiovascular:      Rate and Rhythm: Normal rate and regular rhythm.      Heart sounds: Normal heart sounds.   Pulmonary:      Effort: Pulmonary effort is normal. No respiratory distress.      Breath sounds: Normal breath sounds. No wheezing or rales.   Chest:   Breasts:     Breasts are symmetrical.      Right: No inverted nipple, mass, nipple discharge, skin change or tenderness.      Left: No inverted nipple, mass, nipple discharge, skin change or tenderness.   Abdominal:      General: There is no distension.      Palpations: Abdomen is soft.      Tenderness: There is no abdominal tenderness.   Musculoskeletal:         General: No swelling.   Lymphadenopathy:      Cervical: No cervical adenopathy.      Upper Body:      Right upper body: No supraclavicular or axillary adenopathy.      Left upper body: No supraclavicular or axillary adenopathy.   Skin:     Coloration: Skin is not jaundiced or pale.      Findings: No rash.   Neurological:      General: No focal deficit present.      Mental Status: She is alert and oriented to person, place, and time.      Cranial Nerves: No cranial nerve deficit.      Motor: No weakness or abnormal muscle tone.      Coordination: Coordination normal.      Gait: Gait is intact.   Psychiatric:         Mood and Affect: Mood and affect normal.         Behavior: Behavior normal.         Thought Content: Thought content normal.         Cognition and Memory: Memory normal.         Judgment: Judgment normal.            Labs/ Imaging /Pathology   Rectal cancer surgery, urogynecology notes reviewed.  Mammogram reviewed, negative.    Assessment & Plan:  Teresa Trevino is a 66 year old female with the following medical problems:  ?  1.  T1b N0 M0 stage IB triple negative cancer of the RIGHT breast s/p neoadjuvant carboplatin/docetaxel and lumpectomy 06/2017 with complete response, radiation 09/2017.  On observation, now 4 years out from surgery with no clinical evidence of recurrence.  2.  Chronic mild thrombocytopenia.  Clinically insignificant. Stable, present since 2013.    3.  Recurrent UTIs, vaginal dryness.  Started on topical estrogen cream per urogynecology.  4.  Fecal smearing.  Clinical benefit s/p pelvic floor physical therapy.  Following with [facility].  5.  Rectal intussusception.  Unclear yet  if surgery will be needed.    Current plan:  1.  Continue observation for her breast cancer.  2.  Next mammogram anticipated 04/2022 at [place].  3.  Return to clinic in 6 months.    Thank you for the opportunity to participate in her care.    I have completed this exam with a chaperone per [facility] policy.   Chaperone:  Rogene [place], RN    Parts of this note were created with voice recognition software. Please excuse any grammatical or typographical errors.

## 2021-08-16 ENCOUNTER — Encounter: Admit: 2021-08-16 | Discharge: 2021-08-16 | Payer: MEDICARE

## 2021-08-30 ENCOUNTER — Ambulatory Visit: Admit: 2021-08-30 | Discharge: 2021-08-31 | Payer: MEDICARE

## 2021-08-30 ENCOUNTER — Encounter: Admit: 2021-08-30 | Discharge: 2021-08-30 | Payer: MEDICARE

## 2021-08-30 DIAGNOSIS — N904 Leukoplakia of vulva: Secondary | ICD-10-CM

## 2021-08-30 DIAGNOSIS — R3989 Other symptoms and signs involving the genitourinary system: Secondary | ICD-10-CM

## 2021-08-30 DIAGNOSIS — C50919 Malignant neoplasm of unspecified site of unspecified female breast: Secondary | ICD-10-CM

## 2021-08-30 DIAGNOSIS — D696 Thrombocytopenia, unspecified: Secondary | ICD-10-CM

## 2021-08-30 DIAGNOSIS — Z0389 Encounter for observation for other suspected diseases and conditions ruled out: Secondary | ICD-10-CM

## 2021-08-30 DIAGNOSIS — R1310 Dysphagia, unspecified: Secondary | ICD-10-CM

## 2021-08-30 DIAGNOSIS — K219 Gastro-esophageal reflux disease without esophagitis: Secondary | ICD-10-CM

## 2021-08-30 DIAGNOSIS — N390 Urinary tract infection, site not specified: Secondary | ICD-10-CM

## 2021-08-30 DIAGNOSIS — R32 Unspecified urinary incontinence: Secondary | ICD-10-CM

## 2021-08-30 DIAGNOSIS — C4490 Unspecified malignant neoplasm of skin, unspecified: Secondary | ICD-10-CM

## 2021-08-30 DIAGNOSIS — N3946 Mixed incontinence: Secondary | ICD-10-CM

## 2021-08-30 DIAGNOSIS — K449 Diaphragmatic hernia without obstruction or gangrene: Secondary | ICD-10-CM

## 2021-08-30 DIAGNOSIS — I2510 Atherosclerotic heart disease of native coronary artery without angina pectoris: Secondary | ICD-10-CM

## 2021-08-30 DIAGNOSIS — L900 Lichen sclerosus et atrophicus: Secondary | ICD-10-CM

## 2021-08-30 DIAGNOSIS — D72819 Decreased white blood cell count, unspecified: Secondary | ICD-10-CM

## 2021-08-30 DIAGNOSIS — I10 Essential (primary) hypertension: Secondary | ICD-10-CM

## 2021-08-30 DIAGNOSIS — E785 Hyperlipidemia, unspecified: Secondary | ICD-10-CM

## 2021-08-30 LAB — URINALYSIS, MICROSCOPIC

## 2021-08-30 NOTE — Patient Instructions
[facility] locations:  [facility]. [facility]   [address]   Level 1   [place], [place] [address]  HOURS   Mon 7:00 a.m. - 6:00 p.m.   Tues 7:00 a.m. - 6:00 p.m.   Wed 7:00 a.m. - 6:00 p.m.   Thur 7:00 a.m. - 6:00 p.m.   Fri 7:00 a.m. - 6:00 p.m.   Sat 7:00 a.m. - 12:00 p.m.   Sun Closed -     Office Contact:   [phone]     2. [facility]   [address] [place] [address]  HOURS   Mon 8:00 a.m. - 4:30 p.m.   Tues 8:00 a.m. - 4:30 p.m.   Wed 8:00 a.m. - 4:30 p.m.   Thur 8:00 a.m. - 4:30 p.m.   Fri 8:00 a.m. - 4:30 p.m.   Sat Closed -   Sun Closed -   Office Contact:   [phone]     3. [facility]   [address]   [place], [place] [address]  HOURS   Mon 8:00 a.m. - 5:00 p.m.   Tues 8:00 a.m. - 5:00 p.m.   Wed 8:00 a.m. - 5:00 p.m.   Thur 8:00 a.m. - 5:00 p.m.   Fri 8:00 a.m. - 5:00 p.m.   Sat Closed -   Sun Closed   Office Contact:   [phone]     4. [facility]   [address] ([place])   Laboratory Services, Level 1   [place], [place] [address]  HOURS   Mon 7:00 a.m. - 5:30 p.m.   Tues 7:00 a.m. - 5:30 p.m.   Wed 7:00 a.m. - 5:30 p.m.   Thur 7:00 a.m. - 5:30 p.m.   Fri 7:00 a.m. - 5:30 p.m.   Sat 7:00 a.m. - 12:00 p.m.   Sun 7:00 a.m. - 12:00 p.m.   Office Contact:   [phone]     5. [facility]   [address]   [place], [place] [address]  HOURS   Mon 8:00 a.m. - 4:30 p.m.   Tues 8:00 a.m. - 4:30 p.m.   Wed 8:00 a.m. - 4:30 p.m.   Thur 8:00 a.m. - 4:30 p.m.   Fri 8:00 a.m. - 4:30 p.m.   Sat Closed   Sun Closed   Office Contact:   [phone]     6. [facility]   [address]   ,  [address]   (South side of lobby )  HOURS   Mon 6:30 a.m. - 4:00 p.m.   Tues 6:30 a.m. - 4:00 p.m.   Wed 6:30 a.m. - 4:00 p.m.   Thur 6:30 a.m. - 4:00 p.m.   Fri 6:30 a.m. - 4:00 p.m.   Sat Closed   Sun Closed   Phone: [phone]  Fax: [phone]

## 2021-08-30 NOTE — Progress Notes
CHIEF COMPLAINT:   Chief Complaint   Patient presents with   ? Gyn Exam     Tissue check       History of Present Illness:   Teresa Trevino is a 66 y.o., female who presents for follow up regarding tissue check for lichen sclerosus.    She was initially seen on 07/26/21. She was prescribed clobetasol, vaginal estrogen, and mirabegron and returns today for tissue check and medication check. She has gotten started with the clobetasol (now down to 3 times per week) and is using the vaginal estrogen three times per week too. She feels like these symptoms are improving.     She thinks she has another UTI. Symptoms resolved with antibiotics but started up again shortly after. She left a urine sample today and is comfortable waiting for the culture result before beginning an antibiotic.     She has started the mirabegron and feels like her symptoms are somewhat improved although notes it is a little tough to tell when she has had a couple of infections.     Pertinent Review of Systems:  - General ROS:  Denies recent weight change above 10 pounds,  Denies malaise  - HEENT ROS:  Denies blurring of vision.  Denies double vision,  Denies glaucoma,  Denies dry eyes  - Cardiovascular ROS:  Denies chest pain.  Denies palpitations currently.  Denies orthopnea  - Respiratory ROS: Denies shortness of breath,  Denies cough,  Denies wheezing currently  - Gastrointestinal ROS:  Denies constipation,  Denies gastric reflux,  Denies blood in stool, Denies diarrhea,  Denies Irritable bowel syndrome,  Denies nausea,  Denies vomiting,  Denies abdominal pain  - Endocrine ROS:  Denies polydipsia,  Denies excessive sweating.  Denies hot flashes.  Denies Thyroid disease  - Hematological and Lymphatic ROS:  Denies easy bruisability,  Denies current anemia,  Denies adenopathy in the inguinal area  - Neurological ROS:  Denies syncope,  Denies numbness in lower extremities,  Denies neuropathy.  Denies shooting pain down the legs,  Denies shooting pain in the lower back,  Denies low back pain  - Musculoskeletal ROS:  Denies Joint pain.  Denies Edema in lower extremities.  Denies fibromyalgia  - Psychological ROS:  Denies depression,  Denies anxiety,  Denies thoughts of suicide,  Denies thoughts of homicide,  Denies recent seizure,  Denies syncope   - Dermatological ROS:  Denies eczema.  Denies skin rashes in the groin area and other sites    ALLERGIES:  Bactrim [sulfamethoxazole-trimethoprim], Erythromycin, Macrobid [nitrofurantoin monohyd/m-cryst], and Sulfa (sulfonamide antibiotics)    MEDICATIONS:    Current Outpatient Medications:   ?  cholecalciferol (vitamin D3) (OPTIMAL D3) 50,000 units capsule, TAKE ONE CAPSULE BY MOUTH EVERY WEEK, Disp: , Rfl:   ?  clobetasoL (TEMOVATE) 0.05 % topical ointment, apply topically TO affected AREA TWICE DAILY FOR 2-4 WEEKS THEN taper TO THREE TIMES A WEEK AS NEEDED, Disp: 30 g, Rfl: 1  ?  estradioL (ESTRACE) 0.01 % (0.1 mg/g) vaginal cream, Insert or Apply one g to vaginal area twice weekly., Disp: 42.5 g, Rfl: 11  ?  evolocumab (REPATHA SYRINGE) 140 mg/mL injectable SYRINGE, Inject 1 mL under the skin every 14 days., Disp: 2 mL, Rfl: 11  ?  furosemide (LASIX) 20 mg tablet, Take one tablet by mouth every 48 hours., Disp: 90 tablet, Rfl: 3  ?  hyoscyamine sulfate (LEVSIN/SL) 0.125 mg sublingual tablet, Place one tablet under tongue every 4 hours as needed  for Other... (Bladder spasms, urgency, pelvic pain). Max 1.5 mg/ day, Disp: 30 tablet, Rfl: 3  ?  losartan (COZAAR) 50 mg tablet, Take one tablet by mouth daily., Disp: , Rfl:   ?  mirabegron (MYRBETRIQ) 50 mg ER tablet, Take one tablet by mouth daily., Disp: 30 tablet, Rfl: 3  ?  omeprazole DR (PRILOSEC) 20 mg capsule, Take one capsule by mouth daily before breakfast., Disp: 90 capsule, Rfl: 3  ?  phenazopyridine (PYRIDIUM) 200 mg tablet, Take one tablet by mouth three times daily as needed for Pain. Max 3 days., Disp: 6 tablet, Rfl: 0  ?  psyllium husk (METAMUCIL (WITH SUGAR)) 3.4 gram packet, Take one packet by mouth twice daily., Disp: 60 each, Rfl: 3  ?  rosuvastatin (CRESTOR) 5 mg tablet, Take one tablet by mouth every 7 days., Disp: 12 tablet, Rfl: 3    Medical History:   Diagnosis Date   ? Breast cancer (HCC) 01/2017    right IDC   ? Chronic leukopenia    ? Coronary artery disease    ? Difficulty swallowing     past issue during chemotherapy-no longer an issue per pt   ? Dyslipidemia    ? GERD (gastroesophageal reflux disease)    ? Hiatal hernia    ? Hyperlipemia 08/20/2015   ? Hypertension    ? Incontinence    ? Lichen sclerosus    ? Lichen sclerosus et atrophicus of the vulva 09/22/2015   ? Observation for suspected cardiovascular disease 08/20/2015    04/21/15  Stress Echo :  1.  No exercise induced chest pain.  2.  No ECG evidence of ischemia.  3.  No echocardiographic evidence of ischemia.  4.  Normal  Hemodynamic response to exercise.  5.  No significant exercise induced arrhythmias.  6.  Low risk scan.  7.  Consider medical management if indicated.   ? 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   ? Thrombocytopenia (HCC)     following chemo   ? Urinary tract infection        Surgical History:   Procedure Laterality Date   ? TONSILLECTOMY  1977   ? HX HYSTERECTOMY  2008   ? BLADDER REPAIR  2008    [facility]   ? BLADDER SURGERY  2013    bladder sling   ? CT ANGIOGRAM (NON-INVASIVE)  09/2015   ? UPPER GASTROINTESTINAL ENDOSCOPY  05/26/2017   ? RIGHT RADIOACTIVE SEED LOCALIZED LUMPECTOMY Right 07/06/2017    Performed by Ruthy Dick, DO at [facility] OR   ? IDENTIFICATION SENTINEL LYMPH NODE Right 07/06/2017    Performed by Ruthy Dick, DO at [facility] OR   ? INJECTION RADIOACTIVE TRACER FOR SENTINEL NODE IDENTIFICATION Right 07/06/2017    Performed by Ruthy Dick, DO at [facility] OR   ? SENTINEL LYMPH NODE BIOPSY Right 07/06/2017    Performed by Ruthy Dick, DO at [facility] OR   ? COLONOSCOPY DIAGNOSTIC WITH SPECIMEN COLLECTION BY BRUSHING/ WASHING - FLEXIBLE N/A 02/01/2018    Performed by Virgina Organ, MD at [facility] OR   ? ESOPHAGOGASTRODUODENOSCOPY WITH BIOPSY - FLEXIBLE N/A 02/01/2018    Performed by Virgina Organ, MD at [facility] OR   ? ANORECTAL MANOMETRY N/A 03/12/2021    Performed by Eliott Nine, MD at [facility] ENDO   ? COLONOSCOPY     ? FOOT SURGERY         Family History   Problem Relation Age of Onset   ?  Diabetes Mother    ? Hypertension Mother    ? High Cholesterol Mother    ? Stroke Mother    ? Thyroid Disease Mother    ? Kidney Failure Mother         on HD   ? Diabetes Father         since age 80   ? Heart Failure Father    ? Diabetes Paternal Uncle    ? Cancer Maternal Grandmother    ? Diabetes Maternal Grandfather    ? None Reported Sister    ? Melanoma Neg Hx        Social History     Tobacco Use   ? Smoking status: Never   ? Smokeless tobacco: Never   Substance Use Topics   ? Alcohol use: Yes     Alcohol/week: 4.0 standard drinks of alcohol     Types: 2 Shots of liquor, 2 Standard drinks or equivalent per week     Comment: 2 drinks per week           OBJECTIVE:    Physical Examination:  General appearance/Vital Signs: BP 133/81  - Pulse 88  - Temp 36.6 ?C (97.9 ?F) (Temporal)  - Resp 16  - Ht 172.7 cm (5' 8)  - Wt 94 kg (207 lb 3.2 oz)  - LMP  (LMP Unknown)  - SpO2 99%  - BMI 31.50 kg/m? , not in acute distress, well groomed  Eyes: Sclera white, no nystagmus  Ears/Nose/Throat: Lips and gums appear normal,adequate hearing ability   Psychiatric / Mental status: oriented to time, place and person; affect and mood appropriate; normal interaction  Cardiovascular / Peripheral vascular system: no prominent varicose veins involving the LE, no cyanosis, no visible edema in LE  Chest / Respiratory: normal respiratory effort, unlabored breathing  Musculoskeletal / Extremities: walking with normal gait, normal joint mobility noted visibly in LE bilat  Gastrointestinal / Lower Abdomen: no direct tenderness, soft, nondistended  Lymphatic: No lymphadenopathy of the groin on both sides  Dermatological: suprapubic and vulvar skin inspected visually without any new lesions noted, suprapubic and vulvar skin palpated with no masses felt  Neurologic: sensation over pubis normal, sensation over labia minora normal   Pelvic:  - Ext. Genitalia - stable resorption of labia minora with fusion of the clitoral hood, improvement in overall appearance of vulvar tissue with no evidence of LS flare   - Urethra: midline, non-erythematous, no prolapse seen  - Vagina: atrophic appearance, no lesions  - Cervix: surgically absent  - Uterus: surgically absent  - Adnexa: no masses  - Perineum: normal       ASSESSMENT:  Teresa Trevino is a 66 y.o. G40P1 female seen for follow up of lichen sclerosus, vaginal atrophy, recurrent UTIs and urgency urinary incontinence.  ?  PLAN:  Lichen sclerosus:  - This is biopsy-proven and recently flared, now s/p clobetasol taper  - Continue maintenance clobetasol 2-3 times per week  ?  Vaginal atrophy:   - Continue vaginal estrogen  ?  Recurrent UTI:   - She was given a standing order to use when she develops symptoms  - Urine specimen sent for culture today due to recurrent symptoms. She prefers to await culture results before beginning an antibiotic. If positive, then will treat, obtain a test-of-cure, then start methenamine hippurate.   - If infections persist with methenamine hippurate, will plan for cystoscopy  ?  Urgency urinary incontinence:   - Symptoms improved somewhat with  mirabegron. If symptoms persist, plan for UDS/cystoscopy in consideration of third line therapies  ?  Follow up in 3 months

## 2021-08-30 NOTE — Progress Notes
Pertinent Review of Systems:  - General ROS:  Denies recent weight change above 10 pounds,  Denies malaise  - HEENT ROS:  Denies blurring of vision.  Denies double vision,  Denies glaucoma,  Denies dry eyes  - Cardiovascular ROS:  Denies chest pain.  Denies palpitations currently.  Denies orthopnea  - Respiratory ROS: Denies shortness of breath,  Denies cough,  Denies wheezing currently  - Gastrointestinal ROS:  Denies constipation,  Denies gastric reflux,  Denies blood in stool, Denies diarrhea,  Denies Irritable bowel syndrome,  Denies nausea,  Denies vomiting,  Denies abdominal pain  - Endocrine ROS:  Denies polydipsia,  Denies excessive sweating.  Denies hot flashes.  Denies Thyroid disease  - Hematological and Lymphatic ROS:  Denies easy bruisability,  Denies current anemia,  Denies adenopathy in the inguinal area  - Neurological ROS:  Denies syncope,  Denies numbness in lower extremities,  Denies neuropathy.  Denies shooting pain down the legs,  Denies shooting pain in the lower back,  Denies low back pain  - Musculoskeletal ROS:  Denies Joint pain.  Denies Edema in lower extremities.  Denies fibromyalgia  - Psychological ROS:  Denies depression,  Denies anxiety,  Denies thoughts of suicide,  Denies thoughts of homicide,  Denies recent seizure,  Denies syncope   - Dermatological ROS:  Denies eczema.  Denies skin rashes in the groin area and other sites

## 2021-08-31 DIAGNOSIS — R3989 Other symptoms and signs involving the genitourinary system: Secondary | ICD-10-CM

## 2021-09-02 ENCOUNTER — Encounter: Admit: 2021-09-02 | Discharge: 2021-09-02 | Payer: MEDICARE

## 2021-09-02 DIAGNOSIS — R3989 Other symptoms and signs involving the genitourinary system: Secondary | ICD-10-CM

## 2021-09-02 MED ORDER — CEFDINIR 300 MG PO CAP
300 mg | ORAL_CAPSULE | Freq: Two times a day (BID) | ORAL | 0 refills | 7.00000 days | Status: AC
Start: 2021-09-02 — End: ?

## 2021-09-03 ENCOUNTER — Encounter: Admit: 2021-09-03 | Discharge: 2021-09-03 | Payer: MEDICARE

## 2021-09-06 ENCOUNTER — Encounter: Admit: 2021-09-06 | Discharge: 2021-09-06 | Payer: MEDICARE

## 2021-09-06 ENCOUNTER — Ambulatory Visit: Admit: 2021-09-06 | Discharge: 2021-09-06 | Payer: MEDICARE

## 2021-09-06 DIAGNOSIS — E7800 Pure hypercholesterolemia, unspecified: Secondary | ICD-10-CM

## 2021-09-06 DIAGNOSIS — I10 Essential (primary) hypertension: Secondary | ICD-10-CM

## 2021-09-06 DIAGNOSIS — I2510 Atherosclerotic heart disease of native coronary artery without angina pectoris: Secondary | ICD-10-CM

## 2021-09-06 MED ORDER — ALBUTEROL SULFATE 90 MCG/ACTUATION IN HFAA
2 | RESPIRATORY_TRACT | 0 refills | Status: AC | PRN
Start: 2021-09-06 — End: ?

## 2021-09-06 MED ORDER — EUCALYPTUS-MENTHOL MM LOZG
1 | Freq: Once | ORAL | 0 refills | Status: AC | PRN
Start: 2021-09-06 — End: ?

## 2021-09-06 MED ORDER — RP DX N-13 AMMONIA MCI
20 | Freq: Once | INTRAVENOUS | 0 refills | Status: CP
Start: 2021-09-06 — End: ?
  Administered 2021-09-06: 16:00:00 20.5 via INTRAVENOUS

## 2021-09-06 MED ORDER — SODIUM CHLORIDE 0.9 % IV SOLP
250 mL | INTRAVENOUS | 0 refills | Status: AC | PRN
Start: 2021-09-06 — End: ?

## 2021-09-06 MED ORDER — AMINOPHYLLINE 25 MG/ML IV SOLN
50 mg | INTRAVENOUS | 0 refills | Status: AC | PRN
Start: 2021-09-06 — End: ?

## 2021-09-06 MED ORDER — NITROGLYCERIN 0.4 MG SL SUBL
.4 mg | SUBLINGUAL | 0 refills | Status: AC | PRN
Start: 2021-09-06 — End: ?

## 2021-09-06 MED ORDER — REGADENOSON 0.4 MG/5 ML IV SYRG
.4 mg | Freq: Once | INTRAVENOUS | 0 refills | Status: CP
Start: 2021-09-06 — End: ?
  Administered 2021-09-06: 16:00:00 0.4 mg via INTRAVENOUS

## 2021-09-11 ENCOUNTER — Encounter: Admit: 2021-09-11 | Discharge: 2021-09-11 | Payer: MEDICARE

## 2021-09-13 ENCOUNTER — Encounter: Admit: 2021-09-13 | Discharge: 2021-09-13 | Payer: MEDICARE

## 2021-09-14 ENCOUNTER — Encounter: Admit: 2021-09-14 | Discharge: 2021-09-14 | Payer: MEDICARE

## 2021-09-14 NOTE — Telephone Encounter
**Note Teresa-Identified via Obfuscation** PA Repatha submitted via [place] with patient assistance.    The prior authorization for repatha has been submitted for Teresa Trevino via Cover My Meds.  Will continue to follow.    [place] Patient Advocate  [phone]    Key [id number]

## 2021-09-14 NOTE — Progress Notes
**Note Teresa-Identified via Obfuscation** The prior authorization for repatha has been submitted for Teresa Trevino via Cover My Meds.  Will continue to follow.    [place] Patient Advocate  [phone]

## 2021-09-14 NOTE — Progress Notes
The Prior Authorization for repatha was approved for Teresa Trevino from 09/14/21 to 02/13/22. The PA authorization number is [id number].      [place] Patient Advocate  [phone]

## 2021-09-23 ENCOUNTER — Encounter: Admit: 2021-09-23 | Discharge: 2021-09-23 | Payer: MEDICARE

## 2021-09-23 DIAGNOSIS — N390 Urinary tract infection, site not specified: Secondary | ICD-10-CM

## 2021-09-27 ENCOUNTER — Encounter: Admit: 2021-09-27 | Discharge: 2021-09-27 | Payer: MEDICARE

## 2021-09-27 ENCOUNTER — Ambulatory Visit: Admit: 2021-09-27 | Discharge: 2021-09-28 | Payer: MEDICARE

## 2021-09-27 DIAGNOSIS — K449 Diaphragmatic hernia without obstruction or gangrene: Secondary | ICD-10-CM

## 2021-09-27 DIAGNOSIS — K219 Gastro-esophageal reflux disease without esophagitis: Secondary | ICD-10-CM

## 2021-09-27 DIAGNOSIS — C50919 Malignant neoplasm of unspecified site of unspecified female breast: Secondary | ICD-10-CM

## 2021-09-27 DIAGNOSIS — R32 Unspecified urinary incontinence: Secondary | ICD-10-CM

## 2021-09-27 DIAGNOSIS — Z0389 Encounter for observation for other suspected diseases and conditions ruled out: Secondary | ICD-10-CM

## 2021-09-27 DIAGNOSIS — I10 Essential (primary) hypertension: Secondary | ICD-10-CM

## 2021-09-27 DIAGNOSIS — R1310 Dysphagia, unspecified: Secondary | ICD-10-CM

## 2021-09-27 DIAGNOSIS — E785 Hyperlipidemia, unspecified: Secondary | ICD-10-CM

## 2021-09-27 DIAGNOSIS — L900 Lichen sclerosus et atrophicus: Secondary | ICD-10-CM

## 2021-09-27 DIAGNOSIS — C4490 Unspecified malignant neoplasm of skin, unspecified: Secondary | ICD-10-CM

## 2021-09-27 DIAGNOSIS — D72819 Decreased white blood cell count, unspecified: Secondary | ICD-10-CM

## 2021-09-27 DIAGNOSIS — I2510 Atherosclerotic heart disease of native coronary artery without angina pectoris: Secondary | ICD-10-CM

## 2021-09-27 DIAGNOSIS — D696 Thrombocytopenia, unspecified: Secondary | ICD-10-CM

## 2021-09-27 DIAGNOSIS — N904 Leukoplakia of vulva: Secondary | ICD-10-CM

## 2021-09-27 DIAGNOSIS — N390 Urinary tract infection, site not specified: Secondary | ICD-10-CM

## 2021-09-27 MED ORDER — CEFDINIR 300 MG PO CAP
300 mg | ORAL_CAPSULE | Freq: Two times a day (BID) | ORAL | 0 refills | 7.00000 days | Status: AC
Start: 2021-09-27 — End: ?

## 2021-09-27 NOTE — Telephone Encounter
Start on methenamine after completed course of antibiotics? Mentioned in [facility] last result review patient has appointment  With you in October.

## 2021-10-12 ENCOUNTER — Encounter: Admit: 2021-10-12 | Discharge: 2021-10-12 | Payer: MEDICARE

## 2021-10-12 DIAGNOSIS — R3989 Other symptoms and signs involving the genitourinary system: Secondary | ICD-10-CM

## 2021-10-12 LAB — URINALYSIS, MICROSCOPIC

## 2021-10-12 NOTE — Telephone Encounter
Received urinalysis from [place] to pt to see if she is symptomatic. Pt stated she finished 2nd round of abx. PT stated she felt better for about 1-2 days. Then the burning starts. Pt denies vaginal discharge, odor or itching. Took last dose Thursday. Submitted urine this morning. Sunday/Monday having more burning again. Gets urge to go then starts burning, sits X 2-3 secs and it burns as she is urinating. Today, urgency is back. Sunday night noticed 3 bumps on inside vaginal wall when using clobetasol. Pt states the bumps are new for her. Pt not requesting empiric tx at this time.    Pt called reporting UTI symptoms.   Inquire about symptoms:   - when did symptoms start? Monday  - burning or pain with urination Yes   - bladder/pubic area pressure/pain No  - increased urinary urgency Yes   - increased urinary frequency Yes   - blood in your urine No. If yes, discuss urgently with provider.   - fever No. If yes, instruct patient to go to ED or UC.  - history of hospitalization for UTI No If yes, discuss urgently with provider.  - do you have history of kidney disease, decreased kidney function, or a kidney transplant? No  - when did you last take antibiotics for any reason? Finished abx on Thursday.     If you develop severe pain, fever, nausea/vomiting, pain near your kidneys, or blood in your urine, please call us back or, if after hours, seek care at an urgent care or emergency department.       [place] is in process per [facility]. Results in about 3 days.

## 2021-10-14 ENCOUNTER — Encounter: Admit: 2021-10-14 | Discharge: 2021-10-14 | Payer: MEDICARE

## 2021-10-14 DIAGNOSIS — N390 Urinary tract infection, site not specified: Secondary | ICD-10-CM

## 2021-10-14 MED ORDER — AMOXICILLIN-POT CLAVULANATE 500-125 MG PO TAB
1 | ORAL_TABLET | Freq: Two times a day (BID) | ORAL | 0 refills | 7.00000 days | Status: AC
Start: 2021-10-14 — End: ?

## 2021-10-14 NOTE — Progress Notes
Urine culture positive. Please call patient with positive result. Please also schedule follow up to discuss rUTIs  Rx: Augmentin 500 mg PO BID for 7 days     -- Bactrim [Sulfamethoxazole-Trimethoprim] -- NAUSEA AND VOMITING    --  "Makes me feel ill"   -- Erythromycin -- NAUSEA AND VOMITING   -- Macrobid [Nitrofurantoin Monohyd/M-Cryst] -- NAUSEA AND VOMITING   -- Sulfa (Sulfonamide Antibiotics) -- NAUSEA AND VOMITING

## 2021-10-14 NOTE — Progress Notes
Reviewed positive results with Teresa Trevino.  Rx for Augmentin '500mg'$  po bid x 7 days sent to [facility] as patient is on her way to the [place].  Follow up appt scheduled to review rUTI.

## 2021-10-28 ENCOUNTER — Ambulatory Visit: Admit: 2021-10-28 | Discharge: 2021-10-29 | Payer: MEDICARE

## 2021-10-28 ENCOUNTER — Encounter: Admit: 2021-10-28 | Discharge: 2021-10-28 | Payer: MEDICARE

## 2021-10-28 DIAGNOSIS — N904 Leukoplakia of vulva: Secondary | ICD-10-CM

## 2021-10-28 DIAGNOSIS — N3941 Urge incontinence: Secondary | ICD-10-CM

## 2021-10-28 DIAGNOSIS — C50919 Malignant neoplasm of unspecified site of unspecified female breast: Secondary | ICD-10-CM

## 2021-10-28 DIAGNOSIS — I2510 Atherosclerotic heart disease of native coronary artery without angina pectoris: Secondary | ICD-10-CM

## 2021-10-28 DIAGNOSIS — N390 Urinary tract infection, site not specified: Secondary | ICD-10-CM

## 2021-10-28 DIAGNOSIS — R32 Unspecified urinary incontinence: Secondary | ICD-10-CM

## 2021-10-28 DIAGNOSIS — C4490 Unspecified malignant neoplasm of skin, unspecified: Secondary | ICD-10-CM

## 2021-10-28 DIAGNOSIS — R1310 Dysphagia, unspecified: Secondary | ICD-10-CM

## 2021-10-28 DIAGNOSIS — L900 Lichen sclerosus et atrophicus: Secondary | ICD-10-CM

## 2021-10-28 DIAGNOSIS — N952 Postmenopausal atrophic vaginitis: Secondary | ICD-10-CM

## 2021-10-28 DIAGNOSIS — K449 Diaphragmatic hernia without obstruction or gangrene: Secondary | ICD-10-CM

## 2021-10-28 DIAGNOSIS — Z0389 Encounter for observation for other suspected diseases and conditions ruled out: Secondary | ICD-10-CM

## 2021-10-28 DIAGNOSIS — D696 Thrombocytopenia, unspecified: Secondary | ICD-10-CM

## 2021-10-28 DIAGNOSIS — I10 Essential (primary) hypertension: Secondary | ICD-10-CM

## 2021-10-28 DIAGNOSIS — E785 Hyperlipidemia, unspecified: Secondary | ICD-10-CM

## 2021-10-28 DIAGNOSIS — K219 Gastro-esophageal reflux disease without esophagitis: Secondary | ICD-10-CM

## 2021-10-28 DIAGNOSIS — D72819 Decreased white blood cell count, unspecified: Secondary | ICD-10-CM

## 2021-10-28 MED ORDER — METHENAMINE HIPPURATE 1 GRAM PO TAB
1 g | ORAL_TABLET | Freq: Two times a day (BID) | ORAL | 5 refills | 90.00000 days | Status: AC
Start: 2021-10-28 — End: ?

## 2021-10-28 MED ORDER — CEFDINIR 300 MG PO CAP
300 mg | ORAL_CAPSULE | Freq: Two times a day (BID) | ORAL | 0 refills | 7.00000 days | Status: AC
Start: 2021-10-28 — End: ?

## 2021-10-28 NOTE — Progress Notes
CHIEF COMPLAINT:   Chief Complaint   Patient presents with   ? Follow Up     Recurrent UTI       History of Present Illness:   Teresa Trevino is a 66 y.o., female who presents for follow up regarding rUTIs.    She was last seen on 08/30/21 for a tissue check and follow up after starting mirabegron.     She is using the clobetasol regularly and vaginal estrogen two times per week.  She felt some increased vulvar irritation earlier this week and increased the clobetasol with improvement.     She has had recurrent positive urine cultures. She was treated with a 7-day course of ampicillin-sulbactam on 10/14/21 and returns today for follow up. She reports resolution of her symptoms with the antibiotics but had some return of the burning yesterday. She is concerned that she may not be getting a clean catch due to the anatomic changes with the lichen sclerosus.     She has not started mirabegron as she notes pretty significant improvement in her urinary urgency with antibiotics.     Urine cultures:  07/26/21: >100k E.coli, resistant to ampicillin, ampicillin/sulbactam, TMP-SMX  08/30/21: <100k E.coli, resistant to ampicillin, ampicillin/sulbactam, TMP-SMX  09/15/21: 50k E.coli, resistant to ampicillin, intermediate to ampicillin/sulbactam  10/12/21: 100k E.coli, SENSITIVE to ampicillin/sulbactam     ALLERGIES:  Bactrim [sulfamethoxazole-trimethoprim], Erythromycin, Macrobid [nitrofurantoin monohyd/m-cryst], and Sulfa (sulfonamide antibiotics)    MEDICATIONS:    Current Outpatient Medications:   ?  cholecalciferol (vitamin D3) (OPTIMAL D3) 50,000 units capsule, TAKE ONE CAPSULE BY MOUTH EVERY WEEK, Disp: , Rfl:   ?  clobetasoL (TEMOVATE) 0.05 % topical ointment, apply topically TO affected AREA TWICE DAILY FOR 2-4 WEEKS THEN taper TO THREE TIMES A WEEK AS NEEDED, Disp: 30 g, Rfl: 1  ?  estradioL (ESTRACE) 0.01 % (0.1 mg/g) vaginal cream, Insert or Apply one g to vaginal area twice weekly., Disp: 42.5 g, Rfl: 11  ?  evolocumab (REPATHA SYRINGE) 140 mg/mL injectable SYRINGE, Inject 1 mL under the skin every 14 days., Disp: 2 mL, Rfl: 11  ?  furosemide (LASIX) 20 mg tablet, Take one tablet by mouth every 48 hours., Disp: 90 tablet, Rfl: 3  ?  hyoscyamine sulfate (LEVSIN/SL) 0.125 mg sublingual tablet, Place one tablet under tongue every 4 hours as needed for Other... (Bladder spasms, urgency, pelvic pain). Max 1.5 mg/ day, Disp: 30 tablet, Rfl: 3  ?  losartan (COZAAR) 50 mg tablet, Take one tablet by mouth daily., Disp: , Rfl:   ?  mirabegron (MYRBETRIQ) 50 mg ER tablet, Take one tablet by mouth daily., Disp: 30 tablet, Rfl: 3  ?  omeprazole DR (PRILOSEC) 20 mg capsule, Take one capsule by mouth daily before breakfast., Disp: 90 capsule, Rfl: 3  ?  phenazopyridine (PYRIDIUM) 200 mg tablet, Take one tablet by mouth three times daily as needed for Pain. Max 3 days., Disp: 6 tablet, Rfl: 0  ?  psyllium husk (METAMUCIL (WITH SUGAR)) 3.4 gram packet, Take one packet by mouth twice daily., Disp: 60 each, Rfl: 3  ?  rosuvastatin (CRESTOR) 5 mg tablet, Take one tablet by mouth every 7 days., Disp: 12 tablet, Rfl: 3    Medical History:   Diagnosis Date   ? Breast cancer (HCC) 01/2017    right IDC   ? Chronic leukopenia    ? Coronary artery disease    ? Difficulty swallowing     past issue during chemotherapy-no longer an issue  per pt   ? Dyslipidemia    ? GERD (gastroesophageal reflux disease)    ? Hiatal hernia    ? Hyperlipemia 08/20/2015   ? Hypertension    ? Incontinence    ? Lichen sclerosus    ? Lichen sclerosus et atrophicus of the vulva 09/22/2015   ? Observation for suspected cardiovascular disease 08/20/2015    04/21/15  Stress Echo :  1.  No exercise induced chest pain.  2.  No ECG evidence of ischemia.  3.  No echocardiographic evidence of ischemia.  4.  Normal  Hemodynamic response to exercise.  5.  No significant exercise induced arrhythmias.  6.  Low risk scan.  7.  Consider medical management if indicated.   ? 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   ? Thrombocytopenia (HCC)     following chemo   ? Urinary tract infection        Surgical History:   Procedure Laterality Date   ? TONSILLECTOMY  1977   ? HX HYSTERECTOMY  2008   ? BLADDER REPAIR  2008    [facility]   ? BLADDER SURGERY  2013    bladder sling   ? CT ANGIOGRAM (NON-INVASIVE)  09/2015   ? UPPER GASTROINTESTINAL ENDOSCOPY  05/26/2017   ? RIGHT RADIOACTIVE SEED LOCALIZED LUMPECTOMY Right 07/06/2017    Performed by Ruthy Dick, DO at [facility] OR   ? IDENTIFICATION SENTINEL LYMPH NODE Right 07/06/2017    Performed by Ruthy Dick, DO at [facility] OR   ? INJECTION RADIOACTIVE TRACER FOR SENTINEL NODE IDENTIFICATION Right 07/06/2017    Performed by Ruthy Dick, DO at [facility] OR   ? SENTINEL LYMPH NODE BIOPSY Right 07/06/2017    Performed by Ruthy Dick, DO at [facility] OR   ? COLONOSCOPY DIAGNOSTIC WITH SPECIMEN COLLECTION BY BRUSHING/ WASHING - FLEXIBLE N/A 02/01/2018    Performed by Virgina Organ, MD at [facility] OR   ? ESOPHAGOGASTRODUODENOSCOPY WITH BIOPSY - FLEXIBLE N/A 02/01/2018    Performed by Virgina Organ, MD at [facility] OR   ? ANORECTAL MANOMETRY N/A 03/12/2021    Performed by Eliott Nine, MD at [facility] ENDO   ? COLONOSCOPY     ? FOOT SURGERY         Family History   Problem Relation Age of Onset   ? Diabetes Mother    ? Hypertension Mother    ? High Cholesterol Mother    ? Stroke Mother    ? Thyroid Disease Mother    ? Kidney Failure Mother         on HD   ? Diabetes Father         since age 66   ? Heart Failure Father    ? Diabetes Paternal Uncle    ? Cancer Maternal Grandmother    ? Diabetes Maternal Grandfather    ? None Reported Sister    ? Melanoma Neg Hx        Social History     Tobacco Use   ? Smoking status: Never   ? Smokeless tobacco: Never   Substance Use Topics   ? Alcohol use: Yes     Alcohol/week: 4.0 standard drinks of alcohol     Types: 2 Shots of liquor, 2 Standard drinks or equivalent per week     Comment: 2 drinks per week           OBJECTIVE:    Physical Examination:  General appearance/Vital Signs: BP Marland Kitchen)  154/89  - Pulse 83  - Temp 36.7 ?C (98.1 ?F) (Oral)  - Resp 18  - Wt 93.9 kg (207 lb)  - LMP  (LMP Unknown)  - SpO2 99%  - BMI 31.47 kg/m? , not in acute distress, well groomed  Eyes: Sclera white, no nystagmus  Ears/Nose/Throat: Lips and gums appear normal,adequate hearing ability   Psychiatric / Mental status: oriented to time, place and person; affect and mood appropriate; normal interaction  Cardiovascular / Peripheral vascular system: no prominent varicose veins involving the LE, no cyanosis, no visible edema in LE  Chest / Respiratory: normal respiratory effort, unlabored breathing  Musculoskeletal / Extremities: walking with normal gait, normal joint mobility noted visibly in LE bilat  Gastrointestinal / Lower Abdomen: no direct tenderness, soft, nondistended  Lymphatic: No lymphadenopathy of the groin on both sides  Dermatological: suprapubic and vulvar skin inspected visually without any new lesions noted, suprapubic and vulvar skin palpated with no masses felt  Neurologic: sensation over pubis normal, sensation over labia minora normal   Pelvic:  - Ext. Genitalia - blunting/loss of the labia minora with faint erythema of the vulva near the introitus, no evidence of LS flare   - Urethra: midline, non-erythematous, no prolapse seen  - Vagina: atrophic appearance  - Cervix: surgically absent  - Uterus: surgically absent  - Adnexa: no masses    Urine dipstick: 2+ LE, + nits    ASSESSMENT:  Therma Lasure Plasencia?is a 66 y.o.?G3P1?female seen for follow up of lichen sclerosus, vaginal atrophy, recurrent UTIs and urgency urinary incontinence.  ?  PLAN:  Lichen sclerosus:  -?This is biopsy-proven and recently flared, now s/p clobetasol taper  - Continue maintenance clobetasol 2-3 times per week with taper as needed  ?  Vaginal atrophy:   - Continue vaginal estrogen  ?  Recurrent UTI:   - She has a standing order to use when she develops symptoms  - Catheterized urine specimen sent for culture today due to recurrent symptoms, urine dipstick with +LE and nits. Empiric treatment with cefdinir sent to pharmacy. Will plan for a 14 day course with follow up cystoscopy while on antibiotics. TOC at completion of antibiotics then transition to methenamine hippurate (Rx sent in today)  ?  Follow up for cystoscopy

## 2021-11-01 ENCOUNTER — Encounter: Admit: 2021-11-01 | Discharge: 2021-11-01 | Payer: MEDICARE

## 2021-11-01 DIAGNOSIS — R3989 Other symptoms and signs involving the genitourinary system: Secondary | ICD-10-CM

## 2021-11-01 MED ORDER — CEFDINIR 300 MG PO CAP
300 mg | ORAL_CAPSULE | Freq: Two times a day (BID) | ORAL | 0 refills | 7.00000 days | Status: AC
Start: 2021-11-01 — End: ?

## 2021-11-01 NOTE — Telephone Encounter
Called [facility] to schedule Cystoscopy and pre-procedure urine culture.  Standing order placed.  Courtnie is going out of town on 10/3-10/11 and is concerned that she may get recurrence of UTI and is requesting a prescription for abx to take with her if needed.  She confirmed she currently is on day 4 of Cefdinir and will continue for 10 additional days.

## 2021-11-10 ENCOUNTER — Encounter: Admit: 2021-11-10 | Discharge: 2021-11-10 | Payer: MEDICARE

## 2021-11-10 MED ORDER — CEFDINIR 300 MG PO CAP
ORAL_CAPSULE | ORAL | 0 refills | 7.00000 days | Status: AC
Start: 2021-11-10 — End: ?

## 2021-11-15 ENCOUNTER — Encounter: Admit: 2021-11-15 | Discharge: 2021-11-15 | Payer: MEDICARE

## 2021-11-15 MED ORDER — CEFDINIR 300 MG PO CAP
300 mg | ORAL_CAPSULE | Freq: Two times a day (BID) | ORAL | 0 refills | 7.00000 days | Status: AC
Start: 2021-11-15 — End: ?

## 2021-12-09 ENCOUNTER — Encounter: Admit: 2021-12-09 | Discharge: 2021-12-09 | Payer: MEDICARE

## 2021-12-09 DIAGNOSIS — R3989 Other symptoms and signs involving the genitourinary system: Secondary | ICD-10-CM

## 2021-12-13 ENCOUNTER — Encounter: Admit: 2021-12-13 | Discharge: 2021-12-13 | Payer: MEDICARE

## 2021-12-13 ENCOUNTER — Ambulatory Visit: Admit: 2021-12-13 | Discharge: 2021-12-14 | Payer: MEDICARE

## 2021-12-13 DIAGNOSIS — D72819 Decreased white blood cell count, unspecified: Secondary | ICD-10-CM

## 2021-12-13 DIAGNOSIS — C50919 Malignant neoplasm of unspecified site of unspecified female breast: Secondary | ICD-10-CM

## 2021-12-13 DIAGNOSIS — R32 Unspecified urinary incontinence: Secondary | ICD-10-CM

## 2021-12-13 DIAGNOSIS — R1310 Dysphagia, unspecified: Secondary | ICD-10-CM

## 2021-12-13 DIAGNOSIS — L900 Lichen sclerosus et atrophicus: Secondary | ICD-10-CM

## 2021-12-13 DIAGNOSIS — Z0389 Encounter for observation for other suspected diseases and conditions ruled out: Secondary | ICD-10-CM

## 2021-12-13 DIAGNOSIS — N904 Leukoplakia of vulva: Secondary | ICD-10-CM

## 2021-12-13 DIAGNOSIS — C4490 Unspecified malignant neoplasm of skin, unspecified: Secondary | ICD-10-CM

## 2021-12-13 DIAGNOSIS — N390 Urinary tract infection, site not specified: Secondary | ICD-10-CM

## 2021-12-13 DIAGNOSIS — K219 Gastro-esophageal reflux disease without esophagitis: Secondary | ICD-10-CM

## 2021-12-13 DIAGNOSIS — D696 Thrombocytopenia, unspecified: Secondary | ICD-10-CM

## 2021-12-13 DIAGNOSIS — I2510 Atherosclerotic heart disease of native coronary artery without angina pectoris: Secondary | ICD-10-CM

## 2021-12-13 DIAGNOSIS — K449 Diaphragmatic hernia without obstruction or gangrene: Secondary | ICD-10-CM

## 2021-12-13 DIAGNOSIS — E785 Hyperlipidemia, unspecified: Secondary | ICD-10-CM

## 2021-12-13 DIAGNOSIS — I10 Essential (primary) hypertension: Secondary | ICD-10-CM

## 2021-12-13 MED ORDER — LIDOCAINE HCL 2 % MM JELP
5 mL | Freq: Once | TOPICAL | 0 refills | Status: CP
Start: 2021-12-13 — End: ?
  Administered 2021-12-13: 17:00:00 5 mL via TOPICAL

## 2021-12-13 MED ORDER — CEFDINIR 300 MG PO CAP
300 mg | ORAL_CAPSULE | Freq: Two times a day (BID) | ORAL | 0 refills | 7.00000 days | Status: DC
Start: 2021-12-13 — End: 2021-12-13

## 2021-12-13 MED ORDER — CEFDINIR 300 MG PO CAP
300 mg | ORAL_CAPSULE | Freq: Two times a day (BID) | ORAL | 0 refills | 7.00000 days | Status: AC
Start: 2021-12-13 — End: ?

## 2021-12-13 NOTE — Procedures
Cystourethroscopy     Indication: recurrent UTI    The procedure and its associated risks, benefits, and alternatives were discussed again and informed consent was obtained. Time out was performed verifying the correct patient and procedure.    The patient was positioned in the examination table. Her urethra was prepped with Hibiclens soap and local anesthesia was administered transurethrally. Her bladder was drained with a straight catheter. Intraurethral lidocaine jelly was administered.    UA: negative    Cystourethroscopy was performed with a flexible cystoscope.    The flexible cystoscope was inserted into the urethra and passed into the bladder under direct vision. A full survey of the bladder was performed.  The scope was retroflexed to visualize the bladder neck area.  The scope was withdrawn and the urethra was inspected as it was removed.   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     Attending Attestation:  The attending surgeon was present for the entire operation.

## 2021-12-13 NOTE — Progress Notes
CHIEF COMPLAINT:   Chief Complaint   Patient presents with   ? Procedure     Cysto       History of Present Illness:   Teresa Trevino is a 66 y.o., female who presents for follow up regarding rUTIs.    She was last seen on 10/28/21 and returns today for follow up and cystoscopy. She was on antibiotics last visit with plan to transition to methenamine hippurate upon finishing those antibiotics, but she did not end up starting the methenamine as she never got it from the pharmacy. She started antibiotics last Thursday (12/09/21) for symptoms suspicious for an infection. She notes her urinary urgency and frequency improve significantly when on antibiotics.     She is still using vaginal estrogen 2 times per week and clobetasol 3 times per week.     She reports feeling discomfort for a couple of days about 2 nights ago and then awoke the following morning, voided, and felt almost immediately better. She believes she saw a small stone in the toilet.       ALLERGIES:  Bactrim [sulfamethoxazole-trimethoprim], Erythromycin, Macrobid [nitrofurantoin monohyd/m-cryst], and Sulfa (sulfonamide antibiotics)    MEDICATIONS:    Current Outpatient Medications:   ?  cholecalciferol (vitamin D3) (OPTIMAL D3) 50,000 units capsule, TAKE ONE CAPSULE BY MOUTH EVERY WEEK, Disp: , Rfl:   ?  clobetasoL (TEMOVATE) 0.05 % topical ointment, apply topically TO affected AREA TWICE DAILY FOR 2-4 WEEKS THEN taper TO THREE TIMES A WEEK AS NEEDED, Disp: 30 g, Rfl: 1  ?  estradioL (ESTRACE) 0.01 % (0.1 mg/g) vaginal cream, Insert or Apply one g to vaginal area twice weekly., Disp: 42.5 g, Rfl: 11  ?  evolocumab (REPATHA SYRINGE) 140 mg/mL injectable SYRINGE, Inject 1 mL under the skin every 14 days., Disp: 2 mL, Rfl: 11  ?  furosemide (LASIX) 20 mg tablet, Take one tablet by mouth every 48 hours., Disp: 90 tablet, Rfl: 3  ?  hyoscyamine sulfate (LEVSIN/SL) 0.125 mg sublingual tablet, Place one tablet under tongue every 4 hours as needed for Other... (Bladder spasms, urgency, pelvic pain). Max 1.5 mg/ day, Disp: 30 tablet, Rfl: 3  ?  losartan (COZAAR) 50 mg tablet, Take one tablet by mouth daily., Disp: , Rfl:   ?  methenamine hippurate (HIPREX) 1 gram tablet, Take one tablet by mouth twice daily., Disp: 60 tablet, Rfl: 5  ?  mirabegron (MYRBETRIQ) 50 mg ER tablet, Take one tablet by mouth daily., Disp: 30 tablet, Rfl: 3  ?  omeprazole DR (PRILOSEC) 20 mg capsule, Take one capsule by mouth daily before breakfast., Disp: 90 capsule, Rfl: 3  ?  phenazopyridine (PYRIDIUM) 200 mg tablet, Take one tablet by mouth three times daily as needed for Pain. Max 3 days., Disp: 6 tablet, Rfl: 0  ?  psyllium husk (METAMUCIL (WITH SUGAR)) 3.4 gram packet, Take one packet by mouth twice daily., Disp: 60 each, Rfl: 3  ?  rosuvastatin (CRESTOR) 5 mg tablet, Take one tablet by mouth every 7 days., Disp: 12 tablet, Rfl: 3    Current Facility-Administered Medications:   ?  lidocaine hcl (XYLOCAINE) 2 % jelly (URO-JET) 5 mL, 5 mL, Topical, ONCE, Teresa Trevino, Trevor Iha, MD    Medical History:   Diagnosis Date   ? Breast cancer (HCC) 01/2017    right IDC   ? Chronic leukopenia    ? Coronary artery disease    ? Difficulty swallowing     past issue during chemotherapy-no longer  an issue per pt   ? Dyslipidemia    ? GERD (gastroesophageal reflux disease)    ? Hiatal hernia    ? Hyperlipemia 08/20/2015   ? Hypertension    ? Incontinence    ? Lichen sclerosus    ? Lichen sclerosus et atrophicus of the vulva 09/22/2015   ? Observation for suspected cardiovascular disease 08/20/2015    04/21/15  Stress Echo :  1.  No exercise induced chest pain.  2.  No ECG evidence of ischemia.  3.  No echocardiographic evidence of ischemia.  4.  Normal  Hemodynamic response to exercise.  5.  No significant exercise induced arrhythmias.  6.  Low risk scan.  7.  Consider medical management if indicated.   ? 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   ? Thrombocytopenia (HCC)     following chemo   ? Urinary tract infection Surgical History:   Procedure Laterality Date   ? TONSILLECTOMY  1977   ? HX HYSTERECTOMY  2008   ? BLADDER REPAIR  2008    [facility]   ? BLADDER SURGERY  2013    bladder sling   ? CT ANGIOGRAM (NON-INVASIVE)  09/2015   ? UPPER GASTROINTESTINAL ENDOSCOPY  05/26/2017   ? RIGHT RADIOACTIVE SEED LOCALIZED LUMPECTOMY Right 07/06/2017    Performed by Ruthy Dick, DO at [facility] OR   ? IDENTIFICATION SENTINEL LYMPH NODE Right 07/06/2017    Performed by Ruthy Dick, DO at [facility] OR   ? INJECTION RADIOACTIVE TRACER FOR SENTINEL NODE IDENTIFICATION Right 07/06/2017    Performed by Ruthy Dick, DO at [facility] OR   ? SENTINEL LYMPH NODE BIOPSY Right 07/06/2017    Performed by Ruthy Dick, DO at [facility] OR   ? COLONOSCOPY DIAGNOSTIC WITH SPECIMEN COLLECTION BY BRUSHING/ WASHING - FLEXIBLE N/A 02/01/2018    Performed by Virgina Organ, MD at [facility] OR   ? ESOPHAGOGASTRODUODENOSCOPY WITH BIOPSY - FLEXIBLE N/A 02/01/2018    Performed by Virgina Organ, MD at [facility] OR   ? ANORECTAL MANOMETRY N/A 03/12/2021    Performed by Eliott Nine, MD at [facility] ENDO   ? COLONOSCOPY     ? FOOT SURGERY         Family History   Problem Relation Age of Onset   ? Diabetes Mother    ? Hypertension Mother    ? High Cholesterol Mother    ? Stroke Mother    ? Thyroid Disease Mother    ? Kidney Failure Mother         on HD   ? Diabetes Father         since age 10   ? Heart Failure Father    ? Diabetes Paternal Uncle    ? Cancer Maternal Grandmother    ? Diabetes Maternal Grandfather    ? None Reported Sister    ? Melanoma Neg Hx        Social History     Tobacco Use   ? Smoking status: Never   ? Smokeless tobacco: Never   Substance Use Topics   ? Alcohol use: Yes     Alcohol/week: 4.0 standard drinks of alcohol     Types: 2 Shots of liquor, 2 Standard drinks or equivalent per week     Comment: 2 drinks per week           OBJECTIVE:    Physical Examination:  General appearance/Vital Signs: BP (!) 144/74 (BP Source: Arm,  Left Upper, Patient Position: Sitting)  - Pulse 84  - Temp 36.3 ?C (97.3 ?F)  - Resp 16  - Ht 172.7 cm (5' 8)  - Wt 93.9 kg (207 lb)  - LMP  (LMP Unknown)  - SpO2 100%  - BMI 31.47 kg/m? , not in acute distress, well groomed  Eyes: Sclera white, no nystagmus  Ears/Nose/Throat: Lips and gums appear normal,adequate hearing ability   Psychiatric / Mental status: oriented to time, place and person; affect and mood appropriate; normal interaction  Cardiovascular / Peripheral vascular system: no prominent varicose veins involving the LE, no cyanosis, no visible edema in LE  Chest / Respiratory: normal respiratory effort, unlabored breathing  Musculoskeletal / Extremities: walking with normal gait, normal joint mobility noted visibly in LE bilat  Gastrointestinal / Lower Abdomen: no direct tenderness, soft, nondistended  Lymphatic: No lymphadenopathy of the groin on both sides  Dermatological: suprapubic and vulvar skin inspected visually without any new lesions noted, suprapubic and vulvar skin palpated with no masses felt  Neurologic: sensation over pubis normal, sensation over labia minora normal   Pelvic:  - Ext. Genitalia - No visible lesions noted involving the labia majora or minora, anatomic changes consistent with lichen sclerosus    - Urethra: midline, non-erythematous, no prolapse seen  - Vagina: atrophic appearance  - Perineum: no lesions    Cystoscopy (12/13/21): Scattered cystitis cystica with evidence of acute inflammation    ASSESSMENT:  Teresa Trevino is a 66 y.o. G30P1 female seen for follow up and cystoscopy for rUTI.    PLAN:  - Given appearance of cystitis cystica on cystoscopy, will extend current antibiotic course x 2 weeks then begin methenamine hippurate. Confirmed Rx sent to pharmacy last visit, the patient was encouraged to call if the pharmacy does not have the Rx.   - CT A/P without contrast given suspicion of recent stone passage  - Continue vaginal estrogen  - Continue clobetasol  - Feels she is unable to leave a clean catch due to anatomic changes of her lichen sclerosus and would prefer to leave catheterized specimens for culture, which is fine with me  - Follow up in 3 months, sooner as needed

## 2021-12-14 DIAGNOSIS — N3946 Mixed incontinence: Secondary | ICD-10-CM

## 2021-12-14 DIAGNOSIS — N210 Calculus in bladder: Secondary | ICD-10-CM

## 2021-12-14 DIAGNOSIS — N390 Urinary tract infection, site not specified: Secondary | ICD-10-CM

## 2021-12-21 ENCOUNTER — Encounter: Admit: 2021-12-21 | Discharge: 2021-12-21 | Payer: MEDICARE

## 2021-12-21 MED ORDER — CLOBETASOL 0.05 % TP OINT
OPHTHALMIC | 1 refills | 30.00000 days | Status: AC
Start: 2021-12-21 — End: ?

## 2021-12-21 NOTE — Telephone Encounter
Pt LM requesting refill on clobetasol.     LOV- 12/13/21- Ok to continue clobetasol per Dr. Bonnye Fava.   NOV- 04/08/22.    Refilling to pharmacy.

## 2021-12-24 ENCOUNTER — Encounter: Admit: 2021-12-24 | Discharge: 2021-12-24 | Payer: MEDICARE

## 2021-12-27 ENCOUNTER — Encounter: Admit: 2021-12-27 | Discharge: 2021-12-27 | Payer: MEDICARE

## 2021-12-27 DIAGNOSIS — L900 Lichen sclerosus et atrophicus: Secondary | ICD-10-CM

## 2021-12-27 DIAGNOSIS — R32 Unspecified urinary incontinence: Secondary | ICD-10-CM

## 2021-12-27 DIAGNOSIS — R931 Abnormal findings on diagnostic imaging of heart and coronary circulation: Secondary | ICD-10-CM

## 2021-12-27 DIAGNOSIS — I2510 Atherosclerotic heart disease of native coronary artery without angina pectoris: Secondary | ICD-10-CM

## 2021-12-27 DIAGNOSIS — I10 Essential (primary) hypertension: Secondary | ICD-10-CM

## 2021-12-27 DIAGNOSIS — N390 Urinary tract infection, site not specified: Secondary | ICD-10-CM

## 2021-12-27 DIAGNOSIS — Z0389 Encounter for observation for other suspected diseases and conditions ruled out: Secondary | ICD-10-CM

## 2021-12-27 DIAGNOSIS — D72819 Decreased white blood cell count, unspecified: Secondary | ICD-10-CM

## 2021-12-27 DIAGNOSIS — E7800 Pure hypercholesterolemia, unspecified: Secondary | ICD-10-CM

## 2021-12-27 DIAGNOSIS — N904 Leukoplakia of vulva: Secondary | ICD-10-CM

## 2021-12-27 DIAGNOSIS — R1310 Dysphagia, unspecified: Secondary | ICD-10-CM

## 2021-12-27 DIAGNOSIS — C4490 Unspecified malignant neoplasm of skin, unspecified: Secondary | ICD-10-CM

## 2021-12-27 DIAGNOSIS — K449 Diaphragmatic hernia without obstruction or gangrene: Secondary | ICD-10-CM

## 2021-12-27 DIAGNOSIS — K219 Gastro-esophageal reflux disease without esophagitis: Secondary | ICD-10-CM

## 2021-12-27 DIAGNOSIS — C50919 Malignant neoplasm of unspecified site of unspecified female breast: Secondary | ICD-10-CM

## 2021-12-27 DIAGNOSIS — D696 Thrombocytopenia, unspecified: Secondary | ICD-10-CM

## 2021-12-27 DIAGNOSIS — E785 Hyperlipidemia, unspecified: Secondary | ICD-10-CM

## 2021-12-27 NOTE — Progress Notes
Date of Service: 12/27/2021    Teresa Trevino is a 66 y.o. female.       HPI     Teresa Trevino was seen in our office today for routine follow-up.  She is a 66 year old female followed in office by Dr. Bobette Mo.  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  She initially presented with an abnormal CT calcium score and a CT coronary angiogram revealed moderate plaque in the LAD and mild disease in her left circumflex\.  Her cholesterol is not been well controlled with statins and she has an intolerance to high-dose statins, therefore PCSK9 inhibitor (Repatha) has been added to her regimen.  She also has carcinoma of the breast treated with radiation therapy and Taxotere and carboplatin which do not typically cause cardiotoxicity.  An echocardiogram after therapy performed on 07/17/2017 demonstrated normal left-ventricular function and we do not believe we need to be concerned about cardiotoxicity.     Abdominal ultrasound performed on 02/14/2020 showed no evidence of abdominal aortic aneurysm.    In the past her cholesterol has not been well controlled with statins and she has intolerance to high-dose statins due to muscle pain. Her family doctor initiated her on PCSK9 inhibitor in December 2020, however she ultimately decided she had enough injections after her cancer treatment and went back on rosuvastatin 5 mg weekly and tolerated the dose.  In June 2021 we restarted the Repatha 140 mg every 2 weeks we added low dose rosuvastatin 5 mg weekly and her cholesterol remains well controlled on this regimen.     A carotid duplex scan performed in 04/2020 showed no significant carotid stenosis or disease.    Echocardiogram performed on 09/16/2020 was unremarkable..  Normal LV systolic function with EF 60%, normal RV systolic function.  Mild mitral regurgitation without other valvular stenosis or regurgitation normal PA systolic pressure.    She has been evaluated by Dr. Junita Push in the past for varicose veins.    She ontinues to follow with oncology at [facility], Dr. Olin Pia for her breast cancer and thrombocytopenia.    She is also followed by [facility], Dr.Melanie Meister for recurrent cystitis.      She was last seen in our office on 06/03/2021 by Dr. Doristine Counter.  Due to concern for possible coronary disease due to radiation therapy to her chest and history of coronary calcification she was recommended to have a surveillance stress test.  Plan was made for her to follow-up annually in our office.    For persistent swelling in her left foot and ankle she did undergo outside facility left lower extremity venous Doppler that showed no evidence of DVT and left lower extremity arterial Doppler that was reported as normal.  These tests were ordered by Dr. Herschell Dimes and internist in her PCPs office.    A cardiac PET/CT scan performed on 09/02/2021 showed no evidence of ischemia, normal LV systolic function.  There is mild to moderate coronary calcifications seen mostly in the LAD with mild 5 plaque in thoracic aorta and proximal abdominal  aorta.    Today the patient reports complaints of persistent left ankle and foot swelling despite use of compression and elevation and also recurrent UTIs.  Patient notes that she has had persistent swelling in her left ankle and foot for several months.  She does believe that the swelling get better overnight but never completely goes away.  She has used compression socks consistently without much improvement.  She is not having any right lower extremity symptoms.  He tells me her swelling is unchanged for the past several months.  She also complains of some numbness in the bottoms of both feet, left more than right,  and has seen a podiatrist at [facility], Dr. Dareen Piano.  He is recommended that she have some type of nerve test done.  She believes she is getting go ahead and have the test done to see what it shows.  She does have follow-up with her primary care provider, [facility] [place] later this week.  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 Larina Bras.  She did not have Mohs surgery.  She believes the swelling may be a little worse since the surgery.  Other than her swelling she is not having any other cardiac symptoms and tells me that she feels pretty good.  She notes good energy and is as active as she wants to be.  She denies chest pain, exertional chest pain, shortness of breath, dyspnea exertion, PND, orthopnea, and cough.  She denies palpitations, racing heartbeats, dizziness, lightheadedness, near-syncope and syncope.  Denies fever, chills and sweats.  Denies CVA or TIA type symptoms.  She notes having recurrent UTIs which Dr.Meister, urogyn is following and treating.  Patient states that she just finished a course of cefdinir on 11/10 and then started on 12/25/2021.  Patient does state that since starting the new medication she has felt a bit foggy in the morning and wonders if she should start taking her losartan later in the day.  I suggested that she take the losartan at noon or suppertime apart from the methenamine. She is also using Estrace cream.  Her blood pressure today is 124/78, pulse 88 bpm.  Her weight is stable from her last visit in our office in April of this year.  She is doing very well with her cholesterol treatment regimen including Repatha and low-dose rosuvastatin 5 mg weekly. She denies having muscle pain or weakness.  Her most recent lipid profile checked on 12/23/2021 shows a total cholesterol 137, triglycerides 93, HDL 39, LDL 80 which is up from 66 in March 2023.  Patient states that she has been on several courses of antibiotics for her recurrent UTIs and it seemed that the antibiotics make her more hungry so she feels like she has been eating more.   She cannot tolerate higher doses of rosuvastatin or Zetia.  Is exercising regularly going to the [facility] and doing low impact aerobics class for 60 minutes twice a week and either walking outdoors or on the treadmill for 45 minutes 1 day a week.  Tells me she is very active, does all her own household chores.  She does not have any shortness of breath or limitations with her daily activities or exercise.  Tells me she and her husband are taking a trip to [place] at the end of this month.    Assessment and Plan:  1.  Familial hypercholesterolemia.  Treated with  rosuvastatin 5 mg weekly and Repatha 140 mg every 2 weeks. She is  intolerant to higher doses of statin and Zetia. Her last lipid profile checked 12/23/2021 shows a total cholesterol of 137, triglycerides 93, HDL 39, LDL 80, this is up from 66 in 04/2021.  Patient believes it is because she is been eating more.  She is exercising regularly.  Recommend we continue to monitor.  I have asked her to work diligently on maintaining a cardiac healthy Mediterranean style diet and keep up with her regular exercise.  We will plan to repeat a lipid profile in 3 months.     2.  Statin intolerance.  She has developed severe myalgias with  atorvastatin and higher dose rosuvastatin.  Cannot tolerate other statins or Zetia.     3.  Coronary artery disease.  She has mild disease on CT coronary angiogram in 2017 involving LAD and mild plaque in circumflex with negative FFR in LAD.  No other disease.. Coronary calcium score of 236. Recent  cardiac/PET CT scan performed on 09/06/2021 nonischemic with normal LV systolic function.  Mild to moderate coronary artery calcifications predominantly involving the LAD, mild calcification in the thoracic aorta and proximal abdominal aorta.  She denies chest pain and angina symptoms.  We will continue Repatha and rosuvastatin.     4.  Left lower extremity edema.   Her cardiac work-up is unremarkable.  An echocardiogram performed in August 2022 showed normal LV systolic function with only mild mitral regurgitation and normal PA systolic pressure.  She had a left lower extremity venous Doppler in July of this year that showed no evidence of DVT.  She also had a left lower extremity arterial Doppler that indicates normal blood flow.  She saw Dr.Arnspriger in the past for mild venous insufficiency involving the left greater saphenous vein.     5.History of breast carcinoma, post chemotherapy and radiation therapy to right chest.  No known cardiotoxicity from drugs.  Follows with with breast surgery, oncology and radiation oncology at [facility], [place].     6.  Elevated BMI 31.6.     7. Thrombocytopenia. Followed by oncology at [facility].    8.  Recurrent UTIs. Follows with UroGyn, Dr. Acie Fredrickson.     9.  Ocular migraines. She has follow-up with her ophthalmologist in [place], [place] on 04/06/2020.  Carotid duplex scan performed on 04/27/2020 normal.     10.  Evidence of mild venous insufficiency involving the left great saphenous vein. She has been evaluated by vascular surgery, Dr. Junita Push at [facility] on 06/23/20.       1.Positive family history of abdominal aortic aneurysm.  Abdominal ultrasound performed on 02/14/2020 negative for AAA.     12.  Numbness in sole of both feet.  She has been evaluated and is following with Dr. Dareen Piano, podiatry at [facility].    13.  No evidence of carotid artery disease.     -Continue current medications.  - Suggest moving the losartan to noon or suppertime daily.  - Repeat a fasting lipid profile in 3 months (February 2024).    -Referral to Dr. Trixie Dredge, vascular medicine to evaluate her distant left foot and ankle swelling.  -Recommend continue use of compression stockings during the day, off at night.  Is to use compression when taking long drives in the car or on airplane flights.  -Risk factors reduction and lifestyle modifications..  -Cardiac healthy low-fat, low-cholesterol, low triglyceride, low-sodium diet.  -Encouraged patient to  continue on her regular exercise program and to get in a minimum of 150 minutes of aerobic exercise weekly.  -Recommend weight reduction.  Courage to lose 10 pounds over the next 3 months / 10% body weight over the next 6 months.  Suggest Mediterranean style diet.  Encourage patient to reduce her carbohydrates to less than 100 g a day, avoid fried foods, processed foods and simple sugars.  -Increase exercise.  Encouraged to increase her walking to  a minimum of 30 minutes of walking/moderate aerobic exercise 5 days a week.  Increase duration of exercise to 45 to 60 minutes most days of the week to help with weight loss.     Follow-up: Dr. Rolm Bookbinder in 6-8 months in our [place] office .  The patient is encouraged to contact our office if she has problems prior to next visit.     I have educated the patient on the plan of care today. Patient verbally expressed understanding and agreement with the plan. Instructions are outlined in the after visit summary document.      Thank you for the opportunity to participate in this pleasant patient's care. Please don't hesitate to contact me with any concerns or questions.     Total time spent on today's office visit was 45 minutes. This includes face-to-face in person visit with patient as well as nonface-to-face time including review of the EMR, outside records, labs, radiologic studies, echocardiogram & other cardiovascular studies, assessment, formation of treatment plan, follow up plan,  after visit summary, future disposition, personal discussions with patient. I revieweed and discussed her medication interactions, treatment options/risk/benefits, blood pressure monitoring, blood pressure goals, diet/sodium restriction, exercise, weight reduction and follow-up plan and on documentation.  Patient' s questions were answered to her satisfaction.                              DRB  Vitals:    12/27/21 1043   BP: 124/78   BP Source: Arm, Left Upper   Pulse: 88   SpO2: 99%   O2 Device: None (Room air)   PainSc: Zero   Weight: 94.3 kg (208 lb)   Height: 172.7 cm (5' 8)     Body mass index is 31.63 kg/m?Marland Kitchen     Past Medical History  Patient Active Problem List    Diagnosis Date Noted   ? Internal intussusception of rectum (HCC) 07/02/2021   ? Fecal smearing 04/12/2021   ? Lichen sclerosus    ? Bilateral leg cramps 06/23/2020   ? Venous insufficiency of left leg 06/23/2020   ? Neuropathy 10/24/2019   ? Pelvic pain 08/08/2019   ? Gastroesophageal reflux disease 05/11/2017   ? Essential hypertension 03/02/2017   ? Thrombocytopenia (HCC) 02/16/2017   ? Coronary artery disease involving native coronary artery of native heart without angina pectoris 01/30/2017     08/2007 Exercise sestamibi [facility] [place].  5.18min o9n TM, no CP. EF 59%, no ischemia  03/17  Ex Echo at [facility] - no ischemia, 6 min on TM, TDS, no CP, no ECG changes, EF normal  06/17  Coronary Artery AJ-130 Score: Total 234.  LAD 206, RCA 28  08/17 CT Cor angiogram at [place] EF 56%, chambers and valves normal, LAD-moderate concentric flat plaque, FFR 0.86; LCx mild plaque, RCA normal.  Thoracic aorta normal.     ? 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 Saraceno is a caucasian female who presented to the [facility] on 01/25/2017 at age 36 for evaluation of right breast cancer. Ms. Orfield had no complaints prior to her screening mammogram. A new right breast asymmetry was present on screening mammogram. There are was suspicious on diagnostic imaging and biopsy was recommended. Right breast sono-guided biopsy 01/19/17 ([place]) revealed grade 3 invasive ductal carcinoma. Ms. Fok underwent right RSL lumpectomy/SLNB on 07/06/17. She finished radiation with Dr. Ardeth Perfect on 10/05/17.    PATHOLOGY:  Tumor:  Size/Extent of Tumor Bed: 1.2 x 0.6 cm   Size/Extent of Residual Invasive Tumor: No invasive tumor identified   Overall Residual Cellularity of the Tumor Bed: <1%   1.5 mm DCIS present  Margins Free From Tumor:  Yes  ER:  negative   PR:  negative    Her 2:  negative  Grade:  3  Lymph Nodes:  0/4  LVSI:  no  Extranodal extension:  no      BREAST IMAGING:  Mammogram:    -- Bilateral screening mammogram 12/20/16 ([place]) revealed scattered fibroglandular densities. There was a focal asymmetry present within the central posterior right breast. Additional diagnostic images and ultrasound were recommended. No abnormalities were present on the left breast.  -- Right diagnostic mammogram 01/04/17 ([place]) revealed an approximately 1.2 cm x 0.6 cm x 0.5 cm low-density mass at 4:00 with associated calcifications. This was considered suspicious and further evaluation with ultrasound will be performed.    Ultrasound:    -- Targeted right breast ultrasound 01/04/17 ([place]) revealed at 4:00 there was an irregular hypoechoic mass measuring approximately 6 mm x 9 mm x 8 mm with associated microcalcifications. This was thought to likely correlate with the mammographic abnormality and was considered somewhat suspicious. Ultrasound-guided biopsy was recommended. If the clip marker form sono guided biopsy of this mass did not correlate to the mammographic finding, additional stereotactic biopsy might be required. These findings recommendations were discussed in detail with the patient at the time of today's procedure.     REPRODUCTIVE HEALTH:  Age at first Menarche: Unknown   Age at Menopause:  Hysterectomy/BSO at 50, HRT cream for several months    PROCEDURE: Right RSL lumpectomy/SLNB, 07/06/17  PERTINENT PMH:  HTN, chronic leukopenia  FAMILY HISTORY:  Maternal Grandmother- Breast cancer dx age late 75s  PHYSICAL EXAM on PRESENTATION: Right - No palpable breast masses. No skin, nipple, or areolar change. Left - No palpable breast masses. No skin, nipple, or areolar change. No supraclavicular or axillary adenopathy.   MEDICAL ONCOLOGY:  Dr. Olin Pia NEOADJUVANT THERAPY: Taxotere/carbo completed 06/01/17  REFERRED BY:  Efraim Kaufmann [place]       ? Nocturia 01/2017   ? Post-void dribbling 01/2017   ? Lichen sclerosus et atrophicus of the vulva 09/22/2015   ? Agatston coronary artery calcium score between 200 and 399 08/21/2015   ? Hypercholesterolemia 08/20/2015   ? Vulvar lesion 03/12/2015   ? Recurrent UTI 09/11/2014   ? Dyspareunia, female 05/13/2013   ? Vaginal atrophy 05/13/2013   ? Urge incontinence of urine 05/13/2013   ? Mixed incontinence urge and stress (female)(female) 10/19/2011         Review of Systems   Constitutional: Negative.   HENT: Negative.    Eyes: Negative.    Cardiovascular: Positive for leg swelling (left foot).   Respiratory:  Negative.    Endocrine: Negative.    Hematologic/Lymphatic: Negative.    Skin: Negative.    Musculoskeletal: Negative.    Gastrointestinal: Negative.    Genitourinary: Negative.    Neurological: Positive for numbness.   Psychiatric/Behavioral: Negative.    Allergic/Immunologic: Negative.        Physical Exam  Vital signs were reviewed.   General Appearance:appears well nourished, elevated BMI, appears relaxed, in no acute distress, BMI 31.6  Skin: warm, moist, intact, no rash or lesions, no xanthomas  HEENT: unremarkable, pupils equal and round, no scleral icterus, conjunctivae and lids normal  Lips & Mouth: no pallor or cyanosis  Neck Veins: JVP normal,.  neck veins are flat, neck veins are not distended Carotid Arteries: normal carotid upstroke bilaterally, no bruits bilaterally  Chest Inspection: chest is normal in appearance  Auscultation/Percussion/Effort: lungs clear to auscultation, no rales, rhonchi, or wheezing, respirations even and unlabored, no respiratory distress  Cardiac Rhythm: regular rhythm and normal rate   Cardiac Auscultation: normal S1 & S2, no S3 or S4, no rub   Murmurs: no cardiac murmurs   Lower extremities:no RLE and 1+ LLE edema concentrated mostly to foot and ankles., 2+ symmetric distal pulses   Abdominal Exam: soft, non-tender,non-distended, no obvious masses, bowel sounds normal, no guarding  Liver & Spleen: no organomegaly   Neurologic Exam: grossly intact, alert, moves all extremities equally   Orientation: oriented to time, person and place, clear historian  Gait: normal, steady, walks without assistance  Language & Memory: speech clear, patient responsive, seems to comprehend information            Cardiovascular Health Factors  Vitals BP Readings from Last 3 Encounters:   12/27/21 124/78   12/13/21 (!) 144/74   10/28/21 (!) 154/89     Wt Readings from Last 3 Encounters:   12/27/21 94.3 kg (208 lb)   12/13/21 93.9 kg (207 lb)   10/28/21 93.9 kg (207 lb)     BMI Readings from Last 3 Encounters:   12/27/21 31.63 kg/m?   12/13/21 31.47 kg/m?   10/28/21 31.47 kg/m?      Smoking Social History     Tobacco Use   Smoking Status Never   Smokeless Tobacco Never      Lipid Profile Cholesterol   Date Value Ref Range Status   12/23/2021 137  Final     HDL   Date Value Ref Range Status   12/23/2021 39 (L) >=40 Final     LDL   Date Value Ref Range Status   12/23/2021 80  Final     Triglycerides   Date Value Ref Range Status   12/23/2021 93  Final      Blood Sugar No results found for: HGBA1C  Glucose   Date Value Ref Range Status   08/04/2021 81  Final   09/02/2020 96  Final   02/04/2020 95  Final          Problems Addressed Today  Encounter Diagnoses   Name Primary?   ? Coronary artery disease involving native coronary artery of native heart without angina pectoris Yes   ? Agatston coronary artery calcium score between 200 and 399    ? Essential hypertension    ? Hypercholesterolemia    ? Recurrent UTI                      Current Medications (including today's revisions)  ? cholecalciferol (vitamin D3) (OPTIMAL D3) 50,000 units capsule  TAKE ONE CAPSULE BY MOUTH EVERY WEEK   ? clobetasoL (TEMOVATE) 0.05 % topical ointment apply topically TO affected AREA TWICE DAILY FOR 2-4 WEEKS THEN taper TO THREE TIMES A WEEK AS NEEDED   ? estradioL (ESTRACE) 0.01 % (0.1 mg/g) vaginal cream Insert or Apply one g to vaginal area twice weekly.   ? evolocumab (REPATHA SYRINGE) 140 mg/mL injectable SYRINGE Inject 1 mL under the skin every 14 days.   ? losartan (COZAAR) 50 mg tablet Take one tablet by mouth daily.   ? methenamine hippurate (HIPREX) 1 gram tablet Take one tablet by mouth twice daily.   ? omeprazole DR (PRILOSEC) 20 mg capsule Take one capsule by mouth daily before breakfast.   ? psyllium husk (METAMUCIL (WITH SUGAR)) 3.4 gram packet Take one packet by mouth twice daily.   ? rosuvastatin (CRESTOR) 5 mg tablet Take one tablet by mouth every 7 days.

## 2021-12-30 ENCOUNTER — Encounter: Admit: 2021-12-30 | Discharge: 2021-12-30 | Payer: MEDICARE

## 2021-12-31 ENCOUNTER — Ambulatory Visit: Admit: 2021-12-31 | Discharge: 2021-12-31 | Payer: MEDICARE

## 2021-12-31 ENCOUNTER — Encounter: Admit: 2021-12-31 | Discharge: 2021-12-31 | Payer: MEDICARE

## 2021-12-31 DIAGNOSIS — N210 Calculus in bladder: Secondary | ICD-10-CM

## 2021-12-31 DIAGNOSIS — N390 Urinary tract infection, site not specified: Secondary | ICD-10-CM

## 2022-01-03 ENCOUNTER — Encounter: Admit: 2022-01-03 | Discharge: 2022-01-03 | Payer: MEDICARE

## 2022-01-03 MED ORDER — CEFDINIR 300 MG PO CAP
300 mg | ORAL_CAPSULE | Freq: Two times a day (BID) | ORAL | 0 refills | 7.00000 days | Status: AC
Start: 2022-01-03 — End: ?

## 2022-02-04 ENCOUNTER — Encounter: Admit: 2022-02-04 | Discharge: 2022-02-04 | Payer: MEDICARE

## 2022-02-04 DIAGNOSIS — K219 Gastro-esophageal reflux disease without esophagitis: Secondary | ICD-10-CM

## 2022-02-04 MED ORDER — OMEPRAZOLE 20 MG PO CPDR
20 mg | ORAL_CAPSULE | Freq: Every day | ORAL | 2 refills | Status: AC
Start: 2022-02-04 — End: ?

## 2022-02-04 NOTE — Telephone Encounter
All Protocol Elements met  Medication name: omeprazole  Medication Strength: 20 mg  Quantity: 90  Refills provided:2    Patient was last seen on 09/27/21 and should be seen on or around annually or PRN.

## 2022-02-16 ENCOUNTER — Encounter: Admit: 2022-02-16 | Discharge: 2022-02-16 | Payer: MEDICARE

## 2022-02-16 DIAGNOSIS — D696 Thrombocytopenia, unspecified: Secondary | ICD-10-CM

## 2022-02-16 DIAGNOSIS — C50311 Malignant neoplasm of lower-inner quadrant of right female breast: Secondary | ICD-10-CM

## 2022-02-16 DIAGNOSIS — D72819 Decreased white blood cell count, unspecified: Secondary | ICD-10-CM

## 2022-02-16 DIAGNOSIS — I2510 Atherosclerotic heart disease of native coronary artery without angina pectoris: Secondary | ICD-10-CM

## 2022-02-16 DIAGNOSIS — K449 Diaphragmatic hernia without obstruction or gangrene: Secondary | ICD-10-CM

## 2022-02-16 DIAGNOSIS — N904 Leukoplakia of vulva: Secondary | ICD-10-CM

## 2022-02-16 DIAGNOSIS — H9190 Unspecified hearing loss, unspecified ear: Secondary | ICD-10-CM

## 2022-02-16 DIAGNOSIS — C50919 Malignant neoplasm of unspecified site of unspecified female breast: Secondary | ICD-10-CM

## 2022-02-16 DIAGNOSIS — I10 Essential (primary) hypertension: Secondary | ICD-10-CM

## 2022-02-16 DIAGNOSIS — N952 Postmenopausal atrophic vaginitis: Secondary | ICD-10-CM

## 2022-02-16 DIAGNOSIS — K219 Gastro-esophageal reflux disease without esophagitis: Secondary | ICD-10-CM

## 2022-02-16 DIAGNOSIS — Z0389 Encounter for observation for other suspected diseases and conditions ruled out: Secondary | ICD-10-CM

## 2022-02-16 DIAGNOSIS — C4490 Unspecified malignant neoplasm of skin, unspecified: Secondary | ICD-10-CM

## 2022-02-16 DIAGNOSIS — R32 Unspecified urinary incontinence: Secondary | ICD-10-CM

## 2022-02-16 DIAGNOSIS — L900 Lichen sclerosus et atrophicus: Secondary | ICD-10-CM

## 2022-02-16 DIAGNOSIS — R1310 Dysphagia, unspecified: Secondary | ICD-10-CM

## 2022-02-16 DIAGNOSIS — N390 Urinary tract infection, site not specified: Secondary | ICD-10-CM

## 2022-02-16 DIAGNOSIS — E785 Hyperlipidemia, unspecified: Secondary | ICD-10-CM

## 2022-02-16 NOTE — Progress Notes
02/16/2022    Name: Sivana Kolenovic  DOB: 1955/04/23  MRN: [id number]    Primary Care Physician: Rockwell Germany   Surgeon: Dr. Ruthy Dick  Radiation oncologist: Dr. Nash Dimmer  Gyn: Dr. Acie Fredrickson [phone])     Chief Complaint:   Chief Complaint   Patient presents with    Heme/Onc Care     Hematology/Oncology History:   Cancer Staging   Malignant neoplasm of lower-inner quadrant of right breast of female, estrogen receptor negative   Staging form: Breast, AJCC 8th Edition  - Clinical stage from 01/19/2017: Stage IB (cT1b, cN0, cM0, G3, ER-, PR-, HER2-) - Signed by Dimas Alexandria, PA-C on 01/24/2017    1.  History of chronic intermittent leukopenia and thrombocytopenia, clinically insignificant.  May have autoimmune component and appeared to be her baseline.  However, labs in 01/2017 were unremarkable.  2.  November 2018: Noted on screening mammogram to have 1.2 cm abnormality in the right breast.  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  3.  06/01/2017: Completed neoadjuvant carboplatin/docetaxel x6 with complete response obtained on breast MRI and ultrasound.  After cycle 1, decreased by 20% due to significant toxicity.  4.  07/06/2017: Lumpectomy (Dr. Loreta Ave) showed complete response with no active malignancy.  0/4 lymph nodes involved.  5.  10/05/2017: Completed radiation (Dr. Ardeth Perfect)  6.  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  7.  Current plan: Observation    EGD/colonoscopy 01/2018 ([place]) negative, 10-year follow-up recommended  Mammogram 04/2021 ([place]): Negative    Interval Events  Mrs. Taveras presents to the clinic with her husband for follow-up of her breast cancer.  She has been following with urogynecology regarding recurrent UTIs, vaginal dryness, lichen sclerosis.  Previously received great benefit from pelvic floor physical therapy. Using vaginal estrogen cream for vaginal atrophy. On methenamine for recurrent UTI.  No new headaches, vision changes, shortness of breath, chest pain, abdominal pain, bony pain.  She is traveling more with her husband.    Medical history  Synia Guarente has a past medical history of Breast cancer (HCC) (01/2017) (right IDC), Chronic leukopenia, Coronary artery disease, Difficulty swallowing (past issue during chemotherapy-no longer an issue per pt), Dyslipidemia, GERD (gastroesophageal reflux disease), Hearing loss, Hiatal hernia, Hyperlipemia (08/20/2015), Hypertension, Incontinence, Lichen sclerosus, Lichen sclerosus et atrophicus of the vulva (09/22/2015), Observation for suspected cardiovascular disease (08/20/2015) (04/21/15  Stress Echo :  1.  No exercise induced chest pain.  2.  No ECG evidence of ischemia.  3.  No echocardiographic evidence of ischemia.  4.  Normal  Hemodynamic response to exercise.  5.  No significant exercise induced arrhythmias.  6.  Low risk scan.  7.  Consider medical management if indicated.), Skin cancer (2023) (SCC left shin), Thrombocytopenia (HCC) (following chemo), and Urinary tract infection.    She has a past surgical history that includes hysterectomy (2008); colonoscopy; Foot surgery; ct angiogram (non-invasive) (09/2015); Upper gastrointestinal endoscopy (05/26/2017); tonsillectomy (1977); Mastectomy, partial (Right, 07/06/2017) (RIGHT RADIOACTIVE SEED LOCALIZED LUMPECTOMY performed by Ruthy Dick, DO at [facility] OR/PERIOP); lymph node biopsy (Right, 07/06/2017) (SENTINEL LYMPH NODE BIOPSY performed by Ruthy Dick, DO at [facility] OR/PERIOP); Colonoscopy (N/A, 02/01/2018) (COLONOSCOPY DIAGNOSTIC WITH SPECIMEN COLLECTION BY BRUSHING/ WASHING - FLEXIBLE performed by Virgina Organ, MD at [facility] OR/PERIOP); Upper gastrointestinal endoscopy (N/A, 02/01/2018) (ESOPHAGOGASTRODUODENOSCOPY WITH BIOPSY - FLEXIBLE performed by Virgina Organ, MD at [facility] OR/PERIOP); Bladder surgery (2013) (  bladder sling); and bladder repair (2008) [facility]).    Gatha's family history includes Cancer in her maternal grandmother; Diabetes in her father, maternal grandfather, mother, and paternal uncle; Heart Failure in her father; High Cholesterol in her mother; Hypertension in her mother; Kidney Failure in her mother; None Reported in her sister; Stroke in her mother; Thyroid Disease in her mother.    Social history: Married.  Her husband is a semiretired Education officer, community who is president of the [place] and frequently travels for his work.  They have a condo in [place] which they enjoy and also rent out.  She has never been a smoker.  She rarely drinks alcohol, no drug use.        Medications    Current Outpatient Medications:     cholecalciferol (vitamin D3) (OPTIMAL D3) 50,000 units capsule, TAKE ONE CAPSULE BY MOUTH EVERY WEEK, Disp: , Rfl:     clobetasoL (TEMOVATE) 0.05 % topical ointment, apply topically TO affected AREA TWICE DAILY FOR 2-4 WEEKS THEN taper TO THREE TIMES A WEEK AS NEEDED, Disp: 30 g, Rfl: 1    estradioL (ESTRACE) 0.01 % (0.1 mg/g) vaginal cream, Insert or Apply one g to vaginal area twice weekly., Disp: 42.5 g, Rfl: 11    evolocumab (REPATHA SYRINGE) 140 mg/mL injectable SYRINGE, Inject 1 mL under the skin every 14 days., Disp: 2 mL, Rfl: 11    losartan (COZAAR) 50 mg tablet, Take one tablet by mouth daily., Disp: , Rfl:     methenamine hippurate (HIPREX) 1 gram tablet, Take one tablet by mouth twice daily., Disp: 60 tablet, Rfl: 5    omeprazole DR (PRILOSEC) 20 mg capsule, Take one capsule by mouth daily before breakfast., Disp: 90 capsule, Rfl: 2    psyllium husk (METAMUCIL (WITH SUGAR)) 3.4 gram packet, Take one packet by mouth twice daily., Disp: 60 each, Rfl: 3    rosuvastatin (CRESTOR) 5 mg tablet, Take one tablet by mouth every 7 days., Disp: 12 tablet, Rfl: 3     Allergies:   Allergies   Allergen Reactions    Bactrim [Sulfamethoxazole-Trimethoprim] NAUSEA AND VOMITING     Makes me feel ill Erythromycin NAUSEA AND VOMITING    Macrobid [Nitrofurantoin Monohyd/M-Cryst] NAUSEA AND VOMITING    Sulfa (Sulfonamide Antibiotics) NAUSEA AND VOMITING       Review of Systems  Review of Systems   Constitutional: Negative.    HENT: Negative.     Eyes: Negative.    Respiratory: Negative.     Cardiovascular: Negative.    Gastrointestinal: Negative.         Fecal incontinence   Endocrine: Negative.    Genitourinary:  Positive for pelvic pain and vaginal pain.        Fecal smearing  Urinary incontinence  Recurrent UTI   Musculoskeletal: Negative.    Skin: Negative.    Allergic/Immunologic: Negative.    Neurological: Negative.    Hematological: Negative.    Psychiatric/Behavioral: Negative.     All other systems reviewed and are negative.    [place] Cooperative Oncology Group performance status is 0, Fully active, able to carry on all pre-disease performance without restriction.    Physical Exam  Vitals:    02/16/22 1320   BP: (!) 145/75   BP Source: Arm, Left Upper   Pulse: 82   Temp: 36.4 ?C (97.6 ?F)   Resp: 18   SpO2: 99%   TempSrc: Temporal   PainSc: Zero   Weight: 94.1 kg (207 lb  6.4 oz)      Physical Exam  Vitals reviewed. Exam conducted with a chaperone present.   Constitutional:       General: She is not in acute distress.     Appearance: She is not ill-appearing.   Eyes:      General: No scleral icterus.  Cardiovascular:      Rate and Rhythm: Normal rate and regular rhythm.      Heart sounds: Normal heart sounds.   Pulmonary:      Effort: Pulmonary effort is normal. No respiratory distress.      Breath sounds: Normal breath sounds. No wheezing or rales.   Chest:   Breasts:     Breasts are symmetrical.      Right: No inverted nipple, mass, nipple discharge, skin change or tenderness.      Left: No inverted nipple, mass, nipple discharge, skin change or tenderness.   Abdominal:      General: There is no distension.      Palpations: Abdomen is soft.      Tenderness: There is no abdominal tenderness. Musculoskeletal:         General: No swelling.   Lymphadenopathy:      Cervical: No cervical adenopathy.      Upper Body:      Right upper body: No supraclavicular or axillary adenopathy.      Left upper body: No supraclavicular or axillary adenopathy.   Skin:     Coloration: Skin is not jaundiced or pale.      Findings: No rash.   Neurological:      General: No focal deficit present.      Mental Status: She is alert and oriented to person, place, and time.      Cranial Nerves: No cranial nerve deficit.      Motor: No weakness or abnormal muscle tone.      Coordination: Coordination normal.      Gait: Gait is intact.   Psychiatric:         Mood and Affect: Mood and affect normal.         Behavior: Behavior normal.         Thought Content: Thought content normal.         Cognition and Memory: Memory normal.         Judgment: Judgment normal.            Labs/ Imaging /Pathology   Gynecology notes reviewed.    Assessment & Plan:  Ms. Slupski is a 67 year old female with the following medical problems:     1.  T1b N0 M0 stage IB triple negative cancer of the RIGHT breast s/p neoadjuvant carboplatin/docetaxel and lumpectomy 06/2017 with complete response, radiation 09/2017.  On observation, now 4.5 years out from surgery with no clinical evidence of recurrence.  2.  Chronic mild thrombocytopenia.  Clinically insignificant. Stable, present since 2013.    3.  Recurrent UTIs, vaginal dryness, lichen sclerosis.  Followed by [place] uro[facility]ne[facility]. On vaginal estrogen cream, methenamine.     Current plan:  1.  Continue observation for her breast cancer.  2.  Next mammogram and breast surgery follow-up 04/2022 at [place].  3.  Return to clinic in 6 months.  At that point, she can likely graduate from medical oncology care.    Thank you for the opportunity to participate in her care.    I have completed this exam with a chaperone per [facility] policy.   Chaperone:  Joellen Jersey, MA  Parts of this note were created with voice recognition software. Please excuse any grammatical or typographical errors.

## 2022-03-02 ENCOUNTER — Encounter: Admit: 2022-03-02 | Discharge: 2022-03-02 | Payer: MEDICARE

## 2022-03-02 MED ORDER — REPATHA SYRINGE 140 MG/ML SC SYRG
SUBCUTANEOUS | 11 refills | 28.00000 days | Status: AC
Start: 2022-03-02 — End: ?

## 2022-03-02 NOTE — Telephone Encounter
03/02/2022 8:33 AM     Received a request via computer from the patients pharmacy requesting a refill.  Script e-scribed as requested.

## 2022-03-20 ENCOUNTER — Encounter: Admit: 2022-03-20 | Discharge: 2022-03-20 | Payer: MEDICARE

## 2022-03-20 DIAGNOSIS — I2510 Atherosclerotic heart disease of native coronary artery without angina pectoris: Secondary | ICD-10-CM

## 2022-03-20 DIAGNOSIS — E7801 Familial hypercholesterolemia: Secondary | ICD-10-CM

## 2022-03-20 DIAGNOSIS — R931 Abnormal findings on diagnostic imaging of heart and coronary circulation: Secondary | ICD-10-CM

## 2022-03-20 MED ORDER — CRESTOR 5 MG PO TAB
5 mg | ORAL_TABLET | ORAL | 3 refills
Start: 2022-03-20 — End: ?

## 2022-04-07 ENCOUNTER — Encounter: Admit: 2022-04-07 | Discharge: 2022-04-07 | Payer: MEDICARE

## 2022-04-07 NOTE — Telephone Encounter
On methenamine since 12/27/21.  Jan 22nd or 23rd. Started cefdinir. Didn't do a culture as she was traveling.     Pt called reporting UTI symptoms.   Inquire about symptoms:   - when did symptoms start? Last week. Miserable in [place].   - burning or pain with urination Yes   - bladder/pubic area pressure/pain Yes   - increased urinary urgency Yes   - increased urinary frequency Yes   - blood in your urine No. If yes, discuss urgently with provider.   - fever No. If yes, instruct patient to go to ED or UC.  - history of hospitalization for UTI No If yes, discuss urgently with provider.  - do you have history of kidney disease, decreased kidney function, or a kidney transplant? No  - when did you last take antibiotics for any reason? Cefdinir last dose 03/19/22. Then went back to methenamine 03/20/22.      Pt stated she has been cathed in the past so she may just wait until apt tomorrow for cath ua.      A urine culture result takes 48-72 hours. I will make (your provider) aware that you are having these symptoms. In the meantime, you may take over-the-counter Azo (if no history of decreased kidney function) (aka pyridium) up to 3 times a day for a maximum of 2 days. Drink plenty of water. Cranberry juice may be helpful as well. (Your provider) will review your symptoms and decide if anything further is needed at this time and we will call you back if we need anything further prior to the result. If your symptoms worsen, please give [place] a call.     If you develop severe pain, fever, nausea/vomiting, pain near your kidneys, or blood in your urine, please call us back or, if after hours, seek care at an urgent care or emergency department.

## 2022-04-08 ENCOUNTER — Ambulatory Visit: Admit: 2022-04-08 | Discharge: 2022-04-09 | Payer: MEDICARE

## 2022-04-08 ENCOUNTER — Encounter: Admit: 2022-04-08 | Discharge: 2022-04-08 | Payer: MEDICARE

## 2022-04-08 DIAGNOSIS — N904 Leukoplakia of vulva: Secondary | ICD-10-CM

## 2022-04-08 DIAGNOSIS — N390 Urinary tract infection, site not specified: Secondary | ICD-10-CM

## 2022-04-08 DIAGNOSIS — R1310 Dysphagia, unspecified: Secondary | ICD-10-CM

## 2022-04-08 DIAGNOSIS — C50919 Malignant neoplasm of unspecified site of unspecified female breast: Secondary | ICD-10-CM

## 2022-04-08 DIAGNOSIS — H9190 Unspecified hearing loss, unspecified ear: Secondary | ICD-10-CM

## 2022-04-08 DIAGNOSIS — C4490 Unspecified malignant neoplasm of skin, unspecified: Secondary | ICD-10-CM

## 2022-04-08 DIAGNOSIS — L900 Lichen sclerosus et atrophicus: Secondary | ICD-10-CM

## 2022-04-08 DIAGNOSIS — E785 Hyperlipidemia, unspecified: Secondary | ICD-10-CM

## 2022-04-08 DIAGNOSIS — Z0389 Encounter for observation for other suspected diseases and conditions ruled out: Secondary | ICD-10-CM

## 2022-04-08 DIAGNOSIS — I10 Essential (primary) hypertension: Secondary | ICD-10-CM

## 2022-04-08 DIAGNOSIS — K219 Gastro-esophageal reflux disease without esophagitis: Secondary | ICD-10-CM

## 2022-04-08 DIAGNOSIS — R32 Unspecified urinary incontinence: Secondary | ICD-10-CM

## 2022-04-08 DIAGNOSIS — R3129 Other microscopic hematuria: Secondary | ICD-10-CM

## 2022-04-08 DIAGNOSIS — N952 Postmenopausal atrophic vaginitis: Secondary | ICD-10-CM

## 2022-04-08 DIAGNOSIS — D72819 Decreased white blood cell count, unspecified: Secondary | ICD-10-CM

## 2022-04-08 DIAGNOSIS — K449 Diaphragmatic hernia without obstruction or gangrene: Secondary | ICD-10-CM

## 2022-04-08 DIAGNOSIS — R3989 Other symptoms and signs involving the genitourinary system: Secondary | ICD-10-CM

## 2022-04-08 DIAGNOSIS — D696 Thrombocytopenia, unspecified: Secondary | ICD-10-CM

## 2022-04-08 DIAGNOSIS — I2510 Atherosclerotic heart disease of native coronary artery without angina pectoris: Secondary | ICD-10-CM

## 2022-04-08 LAB — URINALYSIS, MICROSCOPIC

## 2022-04-08 MED ORDER — PHENAZOPYRIDINE 200 MG PO TAB
200 mg | ORAL_TABLET | Freq: Three times a day (TID) | ORAL | 0 refills | Status: AC | PRN
Start: 2022-04-08 — End: ?

## 2022-04-08 MED ORDER — CEFDINIR 300 MG PO CAP
300 mg | ORAL_CAPSULE | Freq: Two times a day (BID) | ORAL | 0 refills | 7.00000 days | Status: AC
Start: 2022-04-08 — End: ?

## 2022-04-08 NOTE — Progress Notes
CHIEF COMPLAINT:   Chief Complaint   Patient presents with    Follow Up     History of Present Illness:   Teresa Trevino is a 67 y.o., female who presents for follow up regarding rUTI.    She was last seen on 12/13/21 for follow up and cystoscopy. 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 She was given an extended course of antibiotics with plan to transition to methenamine hippurate after completion of the antibiotics. She is still using vaginal estrogen.     She called yesterday to report she was treated for a UTI at the end of January. She did not do a culture at that time because she was traveling. She prefers to leave catheterized specimens as she does not believe she can adequately capture a clean catch due to the anatomic changes of her lichen sclerosus.     She reports she was feeling great after starting the methenamine until the end of January. Then she developed UTI symptoms that she self-treated with the prescription she had on hand for her [place] trip. Her symptoms resolved with the antibiotic, which she concluded around February 3rd. She restarted the methenamine then started feeling poorly again about 1-2 weeks later.     Today she reports she is feeling poorly. She has had subjective chills, urgency, frequency and pain/cramping in her lower abdomen. She feels an urge to void, sits on the toilet and then is not able to void. She denies fevers or flank pain.     She feels like she is having a lichens flare. She has been using the clobetasol and vaginal estrogen.           ALLERGIES:  Bactrim [sulfamethoxazole-trimethoprim], Erythromycin, Macrobid [nitrofurantoin monohyd/m-cryst], and Sulfa (sulfonamide antibiotics)    MEDICATIONS:    Current Outpatient Medications:     cholecalciferol (vitamin D3) (OPTIMAL D3) 50,000 units capsule, TAKE ONE CAPSULE BY MOUTH EVERY WEEK, Disp: , Rfl:     clobetasoL (TEMOVATE) 0.05 % topical ointment, apply topically TO affected AREA TWICE DAILY FOR 2-4 WEEKS THEN taper TO THREE TIMES A WEEK AS NEEDED, Disp: 30 g, Rfl: 1    CRESTOR 5 mg tablet, TAKE ONE TABLET BY MOUTH EVERY 7 DAYS, Disp: 12 tablet, Rfl: 3    estradioL (ESTRACE) 0.01 % (0.1 mg/g) vaginal cream, Insert or Apply one g to vaginal area twice weekly., Disp: 42.5 g, Rfl: 11    losartan (COZAAR) 50 mg tablet, Take one tablet by mouth daily., Disp: , Rfl:     methenamine hippurate (HIPREX) 1 gram tablet, Take one tablet by mouth twice daily., Disp: 60 tablet, Rfl: 5    omeprazole DR (PRILOSEC) 20 mg capsule, Take one capsule by mouth daily before breakfast., Disp: 90 capsule, Rfl: 2    psyllium husk (METAMUCIL (WITH SUGAR)) 3.4 gram packet, Take one packet by mouth twice daily., Disp: 60 each, Rfl: 3    REPATHA SYRINGE 140 mg/mL injectable SYRINGE, inject 1ml under the skin every 14 days, Disp: 2 mL, Rfl: 11    Medical History:   Diagnosis Date    Breast cancer (HCC) 01/2017    right IDC    Chronic leukopenia     Coronary artery disease     Difficulty swallowing     past issue during chemotherapy-no longer an issue per pt    Dyslipidemia     GERD (gastroesophageal reflux disease)     Hearing loss     Hiatal hernia  Hyperlipemia 08/20/2015    Hypertension     Incontinence     Lichen sclerosus     Lichen sclerosus et atrophicus of the vulva 09/22/2015    Observation for suspected cardiovascular disease 08/20/2015    04/21/15  Stress Echo :  1.  No exercise induced chest pain.  2.  No ECG evidence of ischemia.  3.  No echocardiographic evidence of ischemia.  4.  Normal  Hemodynamic response to exercise.  5.  No significant exercise induced arrhythmias.  6.  Low risk scan.  7.  Consider medical management if indicated.    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    Thrombocytopenia (HCC)     following chemo    Urinary tract infection        Surgical History:   Procedure Laterality Date    TONSILLECTOMY  1977    HX HYSTERECTOMY  2008    BLADDER REPAIR  2008    [facility] BLADDER SURGERY  2013    bladder sling    CT ANGIOGRAM (NON-INVASIVE)  09/2015    UPPER GASTROINTESTINAL ENDOSCOPY  05/26/2017    RIGHT RADIOACTIVE SEED LOCALIZED LUMPECTOMY Right 07/06/2017    Performed by Ruthy Dick, DO at [facility] OR    IDENTIFICATION SENTINEL LYMPH NODE Right 07/06/2017    Per[facility] by Ruthy Dick, DO at [facility] OR    INJECTION RADIOACTIVE TRACER FOR SENTINEL NODE IDENTIFICATION Right 07/06/2017    Performed by Ruthy Dick, DO at [facility] OR    SENTINEL LYMPH NODE BIOPSY Right 07/06/2017    Performed by Ruthy Dick, DO at [facility] OR    COLONOSCOPY DIAGNOSTIC WITH SPECIMEN COLLECTION BY BRUSHING/ WASHING - FLEXIBLE N/A 02/01/2018    Performed by Virgina Organ, MD at [facility] OR    ESOPHAGOGASTRODUODENOSCOPY WITH BIOPSY - FLEXIBLE N/A 02/01/2018    Performed by Virgina Organ, MD at [facility] OR    ANORECTAL MANOMETRY N/A 03/12/2021    Performed by Eliott Nine, MD at [facility] ENDO    COLONOSCOPY      FOOT SURGERY         Family History   Problem Relation Age of Onset    Diabetes Mother     Hypertension Mother     High Cholesterol Mother     Stroke Mother     Thyroid Disease Mother     Kidney Failure Mother         on HD    Diabetes Father         since age 36    Heart Failure Father     Diabetes Paternal Uncle     Cancer Maternal Grandmother     Diabetes Maternal Grandfather     None Reported Sister     Melanoma Neg Hx        Social History     Tobacco Use    Smoking status: Never    Smokeless tobacco: Never   Substance Use Topics    Alcohol use: Yes     Alcohol/week: 4.0 standard drinks of alcohol     Types: 2 Shots of liquor, 2 Standard drinks or equivalent per week     Comment: 2 drinks per week           OBJECTIVE:    Physical Examination:  General appearance/Vital Signs: BP 138/82 (BP Source: Arm, Left Upper, Patient Position: Sitting)  - Pulse 87  - Temp 36.9 ?C (98.4 ?F) (Oral)  - Resp 16  -  Ht 172.7 cm (5' 8)  - Wt 92.1 kg (203 lb)  - LMP  (LMP Unknown)  - SpO2 98%  - BMI 30.87 kg/m? , not in acute distress, well groomed  Eyes: Sclera white, no nystagmus  Ears/Nose/Throat: Lips and gums appear normal,adequate hearing ability   Psychiatric / Mental status: oriented to time, place and person; affect and mood appropriate; normal interaction  Cardiovascular / Peripheral vascular system: no prominent varicose veins involving the LE, no cyanosis, no visible edema in LE  Chest / Respiratory: normal respiratory effort, unlabored breathing  Musculoskeletal / Extremities: walking with normal gait, normal joint mobility noted visibly in LE bilat  Gastrointestinal / Lower Abdomen: no direct tenderness, soft, nondistended  Lymphatic: No lymphadenopathy of the groin on both sides  Dermatological: suprapubic and vulvar skin inspected visually without any new lesions noted, suprapubic and vulvar skin palpated with no masses felt  Neurologic: sensation over pubis normal, sensation over labia minora normal   Pelvic:  - Ext. Genitalia - No visible lesions noted involving the labia majora or minora   - Urethra: midline, non-erythematous, no prolapse seen  - Vagina: atrophic appearance, no lesions  - Perineum: small erythematous erosion on left labia minora    Straight Catheterization  Her urethra was prepped with Hibiclens. Straight catheterization performed using sterile technique. Pt tolerated procedure well.  Catheterized volume: 30 mL    ASSESSMENT:  Teresa Trevino is a 67 y.o. G25P1 female seen for follow up for rUTI. I suspect she has an acute UTI today and has evidence of a flare in her lichen sclerosus.     PLAN:  - Catheterized specimen sent for culture today. She was given an empiric prescription for Cefdinir and for pyridium. She knows this might need to be changed based on culture results.  We discussed indications to present to the ED or urgent care over the weekend.   - Resume methenamine after completion of the antibiotic.   - Continue vaginal estrogen twice weekly  - Recommend clobetasol taper - increase to twice daily x 2 weeks, daily x 2 weeks, then 2-3 times weekly  - Feels she is unable to leave a clean catch due to anatomic changes of her lichen sclerosus and would prefer to leave catheterized specimens for culture, which is fine with me  - Follow up in 3 months, sooner as needed

## 2022-04-11 ENCOUNTER — Encounter: Admit: 2022-04-11 | Discharge: 2022-04-11 | Payer: MEDICARE

## 2022-04-11 MED ORDER — FLUCONAZOLE 150 MG PO TAB
150 mg | ORAL_TABLET | ORAL | 0 refills | 3.00000 days | Status: AC
Start: 2022-04-11 — End: ?

## 2022-04-20 ENCOUNTER — Encounter: Admit: 2022-04-20 | Discharge: 2022-04-20 | Payer: MEDICARE

## 2022-04-20 ENCOUNTER — Ambulatory Visit: Admit: 2022-04-20 | Discharge: 2022-04-20 | Payer: MEDICARE

## 2022-04-20 DIAGNOSIS — I10 Essential (primary) hypertension: Secondary | ICD-10-CM

## 2022-04-20 DIAGNOSIS — C50919 Malignant neoplasm of unspecified site of unspecified female breast: Secondary | ICD-10-CM

## 2022-04-20 DIAGNOSIS — R1310 Dysphagia, unspecified: Secondary | ICD-10-CM

## 2022-04-20 DIAGNOSIS — C50311 Malignant neoplasm of lower-inner quadrant of right female breast: Secondary | ICD-10-CM

## 2022-04-20 DIAGNOSIS — N904 Leukoplakia of vulva: Secondary | ICD-10-CM

## 2022-04-20 DIAGNOSIS — E785 Hyperlipidemia, unspecified: Secondary | ICD-10-CM

## 2022-04-20 DIAGNOSIS — D696 Thrombocytopenia, unspecified: Secondary | ICD-10-CM

## 2022-04-20 DIAGNOSIS — Z853 Personal history of malignant neoplasm of breast: Secondary | ICD-10-CM

## 2022-04-20 DIAGNOSIS — Z0389 Encounter for observation for other suspected diseases and conditions ruled out: Secondary | ICD-10-CM

## 2022-04-20 DIAGNOSIS — Z1231 Encounter for screening mammogram for malignant neoplasm of breast: Secondary | ICD-10-CM

## 2022-04-20 DIAGNOSIS — K219 Gastro-esophageal reflux disease without esophagitis: Secondary | ICD-10-CM

## 2022-04-20 DIAGNOSIS — H9190 Unspecified hearing loss, unspecified ear: Secondary | ICD-10-CM

## 2022-04-20 DIAGNOSIS — Z9189 Other specified personal risk factors, not elsewhere classified: Secondary | ICD-10-CM

## 2022-04-20 DIAGNOSIS — Z08 Encounter for follow-up examination after completed treatment for malignant neoplasm: Secondary | ICD-10-CM

## 2022-04-20 DIAGNOSIS — N390 Urinary tract infection, site not specified: Secondary | ICD-10-CM

## 2022-04-20 DIAGNOSIS — D72819 Decreased white blood cell count, unspecified: Secondary | ICD-10-CM

## 2022-04-20 DIAGNOSIS — I2510 Atherosclerotic heart disease of native coronary artery without angina pectoris: Secondary | ICD-10-CM

## 2022-04-20 DIAGNOSIS — K449 Diaphragmatic hernia without obstruction or gangrene: Secondary | ICD-10-CM

## 2022-04-20 DIAGNOSIS — R32 Unspecified urinary incontinence: Secondary | ICD-10-CM

## 2022-04-20 DIAGNOSIS — L900 Lichen sclerosus et atrophicus: Secondary | ICD-10-CM

## 2022-04-20 DIAGNOSIS — C4490 Unspecified malignant neoplasm of skin, unspecified: Secondary | ICD-10-CM

## 2022-04-20 NOTE — Progress Notes
Lymphedema Prevention Bioimpedance Spectroscopy (BIS) Monitoring    Reviewed BIS testing results from today.  Results:     Current: -5.1  Baseline: 1.3  Change from Baseline: -6.4    WNL less than 3 standard deviation increase from baseline.      Notified patient via MyChart result was normal and to continue with routine follow up as scheduled. Provided clinic contact information for any questions or concerns.

## 2022-04-20 NOTE — Progress Notes
Bioimpedance Spectroscopy performed.  Advised patient that additional information will be sent via Mychart (preferred) or phone if indicated by the lymphedema nurse within 24 hours.

## 2022-04-20 NOTE — Progress Notes
Name: Teresa Trevino          MRN: [id number]      DOB: 12/18/1955      AGE: 67 y.o.   DATE OF SERVICE: 04/20/2022              Reason for Visit:  Heme/Onc Care      Teresa Trevino is a 67 y.o. female.      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     HISTORY:  Teresa Trevino is a caucasian female who presented to the [facility] on 01/25/2017 at age 70 for evaluation of right breast cancer. Teresa Trevino had no complaints prior to her screening mammogram. A new right breast asymmetry was present on screening mammogram. There are was suspicious on diagnostic imaging and biopsy was recommended. Right breast sono-guided biopsy 01/19/17 ([place]) revealed grade 3 invasive ductal carcinoma. Teresa Trevino underwent right RSL lumpectomy/SLNB on 07/06/17. She finished radiation with Dr. Ardeth Perfect on 10/05/17.    PATHOLOGY:  Tumor:  Size/Extent of Tumor Bed: 1.2 x 0.6 cm   Size/Extent of Residual Invasive Tumor: No invasive tumor identified   Overall Residual Cellularity of the Tumor Bed: <1%   1.5 mm DCIS present  Margins Free From Tumor:  Yes  ER:  negative   PR:  negative    Her 2:  negative  Grade:  3  Lymph Nodes:  0/4  LVSI:  no  Extranodal extension:  no      BREAST IMAGING:  Mammogram:    -- Bilateral screening mammogram 12/20/16 ([place]) revealed scattered fibroglandular densities. There was a focal asymmetry present within the central posterior right breast. Additional diagnostic images and ultrasound were recommended. No abnormalities were present on the left breast.  -- Right diagnostic mammogram 01/04/17 ([place]) revealed an approximately 1.2 cm x 0.6 cm x 0.5 cm low-density mass at 4:00 with associated calcifications. This was considered suspicious and further evaluation with ultrasound will be performed.    Ultrasound:    -- Targeted right breast ultrasound 01/04/17 ([place]) revealed at 4:00 there was an irregular hypoechoic mass measuring approximately 6 mm x 9 mm x 8 mm with associated microcalcifications. This was thought to likely correlate with the mammographic abnormality and was considered somewhat suspicious. Ultrasound-guided biopsy was recommended. If the clip marker form sono guided biopsy of this mass did not correlate to the mammographic finding, additional stereotactic biopsy might be required. These findings recommendations were discussed in detail with the patient at the time of today's procedure.     REPRODUCTIVE HEALTH:  Age at first Menarche: Unknown   Age at Menopause:  Hysterectomy/BSO at 35, HRT cream for several months    PROCEDURE: Right RSL lumpectomy/SLNB, 07/06/17  PERTINENT PMH:  HTN, chronic leukopenia  FAMILY HISTORY:  Maternal Grandmother- Breast cancer dx age late 17s  PHYSICAL EXAM on PRESENTATION: Right - No palpable breast masses. No skin, nipple, or areolar change. Left - No palpable breast masses. No skin, nipple, or  areolar change. No supraclavicular or axillary adenopathy.   MEDICAL ONCOLOGY:  Dr. Olin Pia NEOADJUVANT THERAPY: Taxotere/carbo completed 06/01/17  REFERRED BY:  Efraim Kaufmann [place] PA     Review of Systems   Musculoskeletal:  Positive for joint swelling.   Neurological:  Positive for numbness.     Allergies   Allergen Reactions    Bactrim [Sulfamethoxazole-Trimethoprim] NAUSEA AND VOMITING     Makes me feel ill    Erythromycin NAUSEA AND VOMITING    Macrobid [Nitrofurantoin Monohyd/M-Cryst] NAUSEA AND VOMITING    Sulfa (Sulfonamide Antibiotics) NAUSEA AND VOMITING       Medical History:   Diagnosis Date    Breast cancer (HCC) 01/2017    right IDC Chronic leukopenia     Coronary artery disease     Difficulty swallowing     past issue during chemotherapy-no longer an issue per pt    Dyslipidemia     GERD (gastroesophageal reflux disease)     Hearing loss     Hiatal hernia     Hyperlipemia 08/20/2015    Hypertension     Incontinence     Lichen sclerosus     Lichen sclerosus et atrophicus of the vulva 09/22/2015    Observation for suspected cardiovascular disease 08/20/2015    04/21/15  Stress Echo :  1.  No exercise induced chest pain.  2.  No ECG evidence of ischemia.  3.  No echocardiographic evidence of ischemia.  4.  Normal  Hemodynamic response to exercise.  5.  No significant exercise induced arrhythmias.  6.  Low risk scan.  7.  Consider medical management if indicated.    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    Thrombocytopenia (HCC)     following chemo    Urinary tract infection      Surgical History:   Procedure Laterality Date    TONSILLECTOMY  1977    HX HYSTERECTOMY  2008    BLADDER REPAIR  2008    [facility]    BLADDER SURGERY  2013    bladder sling    CT ANGIOGRAM (NON-INVASIVE)  09/2015    UPPER GASTROINTESTINAL ENDOSCOPY  05/26/2017    RIGHT RADIOACTIVE SEED LOCALIZED LUMPECTOMY Right 07/06/2017    Performed by Ruthy Dick, DO at [facility] OR    IDENTIFICATION SENTINEL LYMPH NODE Right 07/06/2017    Performed by Ruthy Dick, DO at [facility] OR    INJECTION RADIOACTIVE TRACER FOR SENTINEL NODE IDENTIFICATION Right 07/06/2017    Performed by Ruthy Dick, DO at [facility] OR    SENTINEL LYMPH NODE BIOPSY Right 07/06/2017    Performed by Ruthy Dick, DO at [facility] OR    COLONOSCOPY DIAGNOSTIC WITH SPECIMEN COLLECTION BY BRUSHING/ WASHING - FLEXIBLE N/A 02/01/2018    Performed by Virgina Organ, MD at [facility] OR    ESOPHAGOGASTRODUODENOSCOPY WITH BIOPSY - FLEXIBLE N/A 02/01/2018    Performed by Virgina Organ, MD at [facility] OR    ANORECTAL MANOMETRY N/A 03/12/2021    Performed by Eliott Nine, MD at [facility] ENDO    COLONOSCOPY      FOOT SURGERY      HX OOPHORECTOMY Bilateral     age 27    HYSTERECTOMY      age 70     Family History   Problem Relation Age of Onset    Diabetes Mother     Hypertension Mother     High Cholesterol Mother     Stroke Mother  Thyroid Disease Mother     Kidney Failure Mother         on HD    Diabetes Father         since age 107    Heart Failure Father     None Reported Sister     Cancer Maternal Grandmother     Cancer-Breast Maternal Grandmother     Diabetes Maternal Grandfather     Diabetes Paternal Uncle     Melanoma Neg Hx      Social History     Socioeconomic History    Marital status: Married   Tobacco Use    Smoking status: Never    Smokeless tobacco: Never   Substance and Sexual Activity    Alcohol use: Yes     Alcohol/week: 4.0 standard drinks of alcohol     Types: 2 Shots of liquor, 2 Standard drinks or equivalent per week     Comment: 2 drinks per week    Drug use: No    Sexual activity: Not Currently     Partners: Male                     Objective:          cholecalciferol (vitamin D3) (OPTIMAL D3) 50,000 units capsule TAKE ONE CAPSULE BY MOUTH EVERY WEEK    clobetasoL (TEMOVATE) 0.05 % topical ointment apply topically TO affected AREA TWICE DAILY FOR 2-4 WEEKS THEN taper TO THREE TIMES A WEEK AS NEEDED    CRESTOR 5 mg tablet TAKE ONE TABLET BY MOUTH EVERY 7 DAYS    estradioL (ESTRACE) 0.01 % (0.1 mg/g) vaginal cream Insert or Apply one g to vaginal area twice weekly.    losartan (COZAAR) 50 mg tablet Take one tablet by mouth daily.    methenamine hippurate (HIPREX) 1 gram tablet Take one tablet by mouth twice daily.    omeprazole DR (PRILOSEC) 20 mg capsule Take one capsule by mouth daily before breakfast.    phenazopyridine (PYRIDIUM) 200 mg tablet Take one tablet by mouth three times daily as needed for Pain. Take after meals for up to 2 days.    psyllium husk (METAMUCIL (WITH SUGAR)) 3.4 gram packet Take one packet by mouth twice daily.    REPATHA SYRINGE 140 mg/mL injectable SYRINGE inject 1ml under the skin every 14 days     Vitals:    04/20/22 1018   BP: 137/80   BP Source: Arm, Left Upper   Pulse: 85   Temp: 36.7 ?C (98.1 ?F)   Resp: 12   SpO2: 96%   TempSrc: Temporal   PainSc: Zero   Weight: 91.9 kg (202 lb 9.6 oz)   Height: 170.8 cm (5' 7.25)  Comment: 04/20/2022     Body mass index is 31.5 kg/m?Marland Kitchen     Pain Score: Zero       Fatigue Scale: 0-None    Pain Addressed:  N/A    Patient Evaluated for a Clinical Trial: No treatment clinical trial available for this patient.     [place] Cooperative Oncology Group performance status is 0, Fully active, able to carry on all pre-disease performance without restriction.Marland Kitchen     Physical Exam  Vitals reviewed.   Chest:         RIGHT BREAST EXAM:  Breast:  Well healed lumpectomy/SLNB incisions. Radiation skin changes. No palpable masses  Skin Erythema:  No  Attachment of Overlying Skin:  No  Peau d' orange:  No  Chest  Wall Attachment:  No  Nipple Inversion:  No  Nipple Discharge: No    LEFT BREAST EXAM:  Breast: No palpable masses  Skin Erythema:  No  Attachment of Overlying Skin:  No  Peau d' orange:  No  Chest Wall Attachment: No  Nipple Inversion:  No  Nipple Discharge:  No    RIGHT NODAL BASIN EXAM:  Axillary:  negative  Infraclavicular:  negative  Supraclavicular:  negative    LEFT NODAL BASIN EXAM:  Axillary:  negative  Infraclavicular: negative  Supraclavicular:  negative    Constitutional: No acute distress.  HEENT:  Head: Normocephalic and atraumatic.  Eyes: No discharge. No scleral icterus.  Pulmonary/Chest: No respiratory distress.   Neurological: Alert and oriented to person, place and time. No cranial nerve deficit.  Skin: Warm and dry. No rash noted. No erythema. No pallor.  Psychiatric: Normal mood and affect. Behavior is normal. Judgement and thought content normal.            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 She denies any changes in her medical history. She has no new breast complaints. She continues follow up with Dr. Freida Busman for medical oncology and is under active surveillance. She is scheduled for 1 more follow up visit and then will be released. We discussed that if she would like to follow up with her PCP for exams she can or we have a Survivorship [facility] here that she can be seen in for a few years and then be released to her PCP. She would like to be seen in Survivorship and I will place the referral. Teresa Trevino had a BIS today for arm lymphedema surveillance and has no arm complaints. We can discontinue BIS at this time. Teresa Trevino had bilateral screening mammogram today which showed no abnormalities. We can coordinate her imaging next year with her Survivorship appointment. She was given ample time to ask questions all of which were answered to her satisfaction. She was given direct contact information and encouraged to call or utilize MyChart with any interval questions or concerns.    Continue follow up with Dr. Freida Busman  Refer to Survivorship for follow up in 1 year  Bilateral screening mammogram in 1 year  Discontinue BIS  RTC PRN    Guy Begin, PA-C

## 2022-05-05 NOTE — Progress Notes
Radiation Oncology Follow Up Note  Date: 05/11/2022       Teresa Trevino is a 67 y.o. female.     The encounter diagnosis was Malignant neoplasm of lower-inner quadrant of right breast of female, estrogen receptor negative  (HCC).  Staging:  Cancer Staging   Malignant neoplasm of lower-inner quadrant of right breast of female, estrogen receptor negative  (HCC)  Staging form: Breast, AJCC 8th Edition  - Clinical stage from 01/19/2017: Stage IB (cT1b, cN0, cM0, G3, ER-, PR-, HER2-) - Signed by Dimas Alexandria, PA-C on 01/24/2017      History of Present Illness  Teresa Trevino is a 67 y.o. 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 She was recommended to receive adjuvant radiotherapy.     Diagnosis:   Stage IB ER-, PR-, HER2- right breast cancer s/p BCS     Treatment History:   Teresa Trevino underwent adjuvant radiotherapy to her right breast 09/07/2017 - 10/05/2017. She tolerated treatment well and had no treatment interruptions.       09/28/2017   Course ID C1 RT BREAST   First Treatment Date 09-07-2017 12:45PM   Last Treatment Date 09-28-2017 10:55AM   Treatment Elapsed Days 21   Reference Point ID RT BREAST   Dosage Given To Date 40.05   Session Dosage Given 2.67   Plan ID RT BREAST_FiF   Fractions Treated to Date 15   Total Fractions on Plan 15   Prescribed Dose per Fraction 2.67   Prescription Dose 4,005           10/05/2017   Course ID C1 RT BREAST   First Treatment Date 09-07-2017 12:45PM   Last Treatment Date 10-05-2017 11:50AM   Treatment Elapsed Days 28   Reference Point ID [id number]   Dosage Given To Date 10   Session Dosage Given 2   Plan ID BOOST ELEC   Fractions Treated to Date 5   Total Fractions on Plan 5   Prescribed Dose per Fraction 2   Prescription Dose 1,000      Total Dose: 5,005 cGy    Subjective:       Teresa Trevino returns today for routine follow-up. She is unaccompanied to today's visit. She is doing well overall. She does not experience significant pain or tenderness of her right breast, though does experience discomfort/pulling sensation involving the inferior portion of her right breast/chest wall in some positions. This lasts only a couple of minutes and generally isn't very bothersome. Range of motion of her right arm is good and she denies arm pain, numbness or swelling. Patient denies shortness of air, cough or chills. Patient denies neurologic symptoms such as headache, dizziness or vision changes. Denies fatigue. She does have neuropathic pain in her feet, this tends to occur at night. She has followed with her PCP for this and was referred for some nerve testing. We briefly discussed medication that could be helpful. She is not interested in gabapentin but does plan to speak with her PCP about options. She did complete radiation to her left shin for a skin cancer last year. Now she notices some left ankle/foot swelling but notes treated lesion has entirely healed. She is staying very active, mainly with her social activities and her husband's community and political involvement; he is running for [employer]. She is going to see the breast survivorship clinic next spring.  She anticipates that Dr. Freida Busman won't need to see her again after her next appointment. She is a bit apprehensive about reducing follow-up and would like to continue to follow with Teresa Trevino annually. She does have some edema of the right breast on exam; discussed lymphedema. Symptoms minimally bothersome; she plans to get a compression bra.      Review of Systems   Constitutional:  Negative for fatigue and fever.   HENT:  Negative for sore throat and trouble swallowing.    Respiratory:  Negative for cough and shortness of breath.    Cardiovascular:  Positive for leg swelling (L > R).   Musculoskeletal:  Positive for arthralgias and back pain. Negative for myalgias.   Skin:  Negative for color change.   Neurological:  Positive for numbness (bilateral feet). Negative for light-headedness and headaches.   Hematological:  Negative for adenopathy.   Psychiatric/Behavioral:  The patient is not nervous/anxious.    All other systems reviewed and are negative.      Objective:          cholecalciferol (vitamin D3) (OPTIMAL D3) 50,000 units capsule TAKE ONE CAPSULE BY MOUTH EVERY WEEK    clobetasoL (TEMOVATE) 0.05 % topical ointment apply topically TO affected AREA TWICE DAILY FOR 2-4 WEEKS THEN taper TO THREE TIMES A WEEK AS NEEDED    CRESTOR 5 mg tablet TAKE ONE TABLET BY MOUTH EVERY 7 DAYS    estradioL (ESTRACE) 0.01 % (0.1 mg/g) vaginal cream Insert or Apply one g to vaginal area twice weekly.    losartan (COZAAR) 50 mg tablet Take one tablet by mouth daily.    omeprazole DR (PRILOSEC) 20 mg capsule Take one capsule by mouth daily before breakfast.    phenazopyridine (PYRIDIUM) 200 mg tablet Take one tablet by mouth three times daily as needed for Pain. Take after meals for up to 2 days.    psyllium husk (METAMUCIL (WITH SUGAR)) 3.4 gram packet Take one packet by mouth twice daily.    REPATHA SYRINGE 140 mg/mL injectable SYRINGE inject 1ml under the skin every 14 days     Vitals:    05/11/22 1037   BP: 128/79   BP Source: Arm, Left Upper   Pulse: 78   Temp: 36.3 ?C (97.3 ?F)   Resp: 18   SpO2: 99%   TempSrc: Temporal   PainSc: Zero   Weight: 92.2 kg (203 lb 3.2 oz)     Body mass index is 31.59 kg/m?Marland Kitchen     Pain Score: Zero        Fatigue Scale: 0-None    KARNOFSKY PERFORMANCE SCORE:  100% Normal, no complaints     Physical Exam  Vitals reviewed.   Constitutional:       General: She is not in acute distress.     Appearance: Normal appearance. She is well-developed. She is not ill-appearing.   HENT:      Head: Normocephalic and atraumatic.   Eyes:      General: Lids are normal.      Extraocular Movements: Extraocular movements intact.      Conjunctiva/sclera: Conjunctivae normal.   Cardiovascular:      Rate and Rhythm: Normal rate and regular rhythm.      Heart sounds: Normal heart sounds.   Pulmonary:      Effort: Pulmonary effort is normal.      Breath sounds: Normal breath sounds.   Chest:   Breasts:     Right: Swelling present.  Comments: Moderate edema of dependent portion of right breast, otherwise no palpable abnormalities in bilateral breasts.  No skin, nipple, or areolar changes bilaterally.      Musculoskeletal:      Cervical back: Neck supple.   Lymphadenopathy:      Cervical: No cervical adenopathy.      Upper Body:      Right upper body: No supraclavicular or axillary adenopathy.      Left upper body: No supraclavicular or axillary adenopathy.   Skin:     General: Skin is warm and dry.      Coloration: Skin is not pale.   Neurological:      Mental Status: She is alert and oriented to person, place, and time. Mental status is at baseline.      Cranial Nerves: Cranial nerves 2-12 are intact.      Sensory: Sensation is intact.      Motor: Motor function is intact.   Psychiatric:         Attention and Perception: Attention normal.         Mood and Affect: Mood and affect normal.         Speech: Speech normal.         Behavior: Behavior normal.         Cognition and Memory: Cognition and memory normal.            Laboratory:    Comprehensive Metabolic Profile    Lab Results   Component Value Date/Time    NA 138 08/04/2021 12:00 AM    K 3.6 08/04/2021 12:00 AM    CL 103 08/04/2021 12:00 AM    CO2 25.0 08/04/2021 12:00 AM    GAP 11 09/02/2020 12:00 AM    BUN 14.0 08/04/2021 12:00 AM    CR 0.78 08/04/2021 12:00 AM    GLU 81 08/04/2021 12:00 AM    Lab Results   Component Value Date/Time    CA 9.3 08/04/2021 12:00 AM    ALBUMIN 3.9 05/05/2021 12:00 AM    TOTPROT 6.3 05/05/2021 12:00 AM    ALKPHOS 45 05/05/2021 12:00 AM    AST 18 05/05/2021 12:00 AM    ALT 25 05/05/2021 12:00 AM    TOTBILI 0.44 05/05/2021 12:00 AM    GFR 76.5 09/02/2020 12:00 AM    GFRAA >60 06/22/2017 12:51 PM        CBC w diff    Lab Results Component Value Date/Time    WBC 5.58 08/04/2021 12:00 AM    RBC 4.12 08/04/2021 12:00 AM    HGB 13.2 08/04/2021 12:00 AM    HCT 38.9 08/04/2021 12:00 AM    MCV 94.4 08/04/2021 12:00 AM    MCH 32.0 08/04/2021 12:00 AM    MCHC 33.9 08/04/2021 12:00 AM    RDW 41.4 08/04/2021 12:00 AM    PLTCT 159 (L) 08/04/2021 12:00 AM    MPV 9.2 (L) 08/04/2021 12:00 AM    Lab Results   Component Value Date/Time    NEUT 59 01/17/2018 10:56 AM    ANC 2.90 01/17/2018 10:56 AM    LYMA 28 01/17/2018 10:56 AM    ALC 1.40 01/17/2018 10:56 AM    MONA 11 01/17/2018 10:56 AM    AMC 0.60 01/17/2018 10:56 AM    EOSA 1 01/17/2018 10:56 AM    AEC 0.10 01/17/2018 10:56 AM    BASA 1 01/17/2018 10:56 AM    ABC 0.00 01/17/2018 10:56 AM  Imaging:   MAMMO SCREEN BILAT/TOMO  Narrative: [facility]  Imaging Mammography: [place]  [address]  [place] [address]  [phone]    EXAM:  [place] SCREEN BILAT/TOMO 04/20/22  9:59 AM     INDICATION:   Screening    COMPARISON:  Compared to:   12/20/2016 MAMMO SCREEN BILAT/TOMO/CAD  04/13/2020 MAMMO SCREEN BILAT/TOMO/CAD  04/14/2021 MAMMO SCREEN BILAT/TOMO/CAD     BREAST COMPOSITION:   The breasts have scattered areas of fibroglandular density.         TECHNIQUE:  3-D (digital tomosynthesis) and synthetic 2-D images were obtained   bilaterally.    FINDINGS:  No suspicious abnormality is seen.  Impression: :    ASSESSMENT:  Overall: 1 - Negative     RECOMMENDATION AND DUE DATE:  Routine Screening Mammogram in 1 Year - Bilateral  04/22/2023    Electronically signed and approved by: Nicholaus Corolla, MD 04/20/2022   10:13 AM     By my electronic signature, I attest that I have personally reviewed the   images for this examination and formulated the interpretations and   opinions expressed in this report         Path:  PATHOLOGY REPORT   Date Value Ref Range Status   02/01/2018   Final    [facility]  [email or web address]    Department of Pathology and Laboratory Medicine  [address], [place], [place] [address]  Surgical Pathology Office:  [phone]  Fax:  [phone]  SURGICAL PATHOLOGY REPORT    NAME: AFTAN, PLILER SURG PATH #: [id number] MR #: [id number] SPECIMEN  CLASS: SR BILLING #: [id number] ALT ID #:  LOCATION: [facility] DATE OF  PROCEDURE: 02/01/2018 AGE:  62 SEX: F DATE RECEIVED: 02/02/2018 DOB:  12-06-1955  TIME RECEIVED:  07:20 PHYSICIAN: SIDORENKO,ELENA   MGA DATE OF  REPORT: 02/05/2018 COPY TO:  DATE OF PRINTING: 02/05/2018         ########################################################################  Final Diagnosis:    A. Gastric mucosa, gastric polyps, biopsy:  Fundic gland polyps.     B. Gastric mucosa, gastric biopsies r/o H. pylori, biopsy:  Mild chronic inactive gastritis.   No H. pylori-like organisms are identified on H and E sections.       Attestation:  By this signature, I attest that I have personally formulated the final  interpretation expressed in this report and that the above diagnosis is  based upon my examination of the slides and/or other material indicated in  this report.    +++Electronically Signed Out By Kellie Simmering, MD, PhD on 02/05/2018+++             zh/02/02/2018             ########################################################################  Material Received:  A: gastric polyps  B: gastric biopsies r/o h. pylori    History:  67 year old female with history of gastroesophageal reflux disease,  esophagitis presence not specified, gastric polyps, encounter for  colorectal cancer screening.  B. Rule out H. pylori.         Gross Description:  A. Received in formalin labeled gastric polyps is a 0.6 x 0.5 x 0.3 cm  aggregate of tan-brown soft tissue fragments. The specimen is entirely  submitted in cassette A1. (tn)    B. Received in formalin labeled gastric biopsies rule out H. pylori is a  0.7 x 0.6 x 0.5 cm aggregate of  tan-brown soft tissue fragments. The  specimen is entirely submitted in cassette B1. (tn)    tan/02/02/2018              ]     Assessment and Plan:   Ms. Falonda Mccluskey is a 67 year-old female with triple negative right breast cancer, stage IB (T1B N0 M0), status post neoadjuvant carboplatin/docetaxel and lumpectomy 06/2017 with complete response. She completed adjuvant radiation to the right breast to a total of 5,005 cGy in 20 fractions between 09/07/2017 and 10/05/2017.     - Sorelle Tokarz was seen today for routine follow-up after completion of radiation therapy.  At this time, she is clinically stable with no clinical evidence of disease recurrence or progression.  She has no subacute toxicities from having undergone adjuvant radiation, aside from lymphedema of the right breast  - discussed presence of breast lymphedema. Reassurance provided. Minimal symptoms. Discussed compression and massage techniques. Patient plans to get a compression bra.   - mammogram 04/20/2022 with no suspicious abnormalities  - Our plan will be for the patient to return to our clinic in 6 months for continued follow-up, at which time we will continue annual follow-up to alternate with the survivorship clinic. She may contact Teresa Trevino in the interim if there are any questions or concerns requiring earlier assessment.  -The patient will keep their appointments with their other managing providers including medical oncology, Dr. Freida Busman; she has f/u scheduled in July and then will likely graduate from medical oncology care. She is following with breast surgery as needed only. Will establish with survivorship clinic in March 2025.          Total time for today's visit was 31 minutes. Visit time spent on the following: preparing to see the patient, obtaining and/or reviewing separately obtained history/information, performing a physical examination and/or evaluation, counseling and educating the patient/family/caregiver, referring to and communication with other health care professionals, documenting clinical information in the electronic or other health record and care coordination.     Liston Alba AGNP-C  [facility] - Radiation Oncology  [address]  [place], [place] [address]    Collaborating Physician: Elby Showers, MD    Parts of this note were created using voice recognition software.  Please excuse any grammatical or typographical errors.

## 2022-05-11 ENCOUNTER — Encounter: Admit: 2022-05-11 | Discharge: 2022-05-11 | Payer: MEDICARE

## 2022-05-11 ENCOUNTER — Ambulatory Visit: Admit: 2022-05-11 | Discharge: 2022-05-11 | Payer: MEDICARE

## 2022-05-11 DIAGNOSIS — Z0389 Encounter for observation for other suspected diseases and conditions ruled out: Secondary | ICD-10-CM

## 2022-05-11 DIAGNOSIS — C50311 Malignant neoplasm of lower-inner quadrant of right female breast: Secondary | ICD-10-CM

## 2022-05-11 DIAGNOSIS — C4490 Unspecified malignant neoplasm of skin, unspecified: Secondary | ICD-10-CM

## 2022-05-11 DIAGNOSIS — I10 Essential (primary) hypertension: Secondary | ICD-10-CM

## 2022-05-11 DIAGNOSIS — C50919 Malignant neoplasm of unspecified site of unspecified female breast: Secondary | ICD-10-CM

## 2022-05-11 DIAGNOSIS — D696 Thrombocytopenia, unspecified: Secondary | ICD-10-CM

## 2022-05-11 DIAGNOSIS — I2510 Atherosclerotic heart disease of native coronary artery without angina pectoris: Secondary | ICD-10-CM

## 2022-05-11 DIAGNOSIS — Z09 Encounter for follow-up examination after completed treatment for conditions other than malignant neoplasm: Secondary | ICD-10-CM

## 2022-05-11 DIAGNOSIS — N390 Urinary tract infection, site not specified: Secondary | ICD-10-CM

## 2022-05-11 DIAGNOSIS — K219 Gastro-esophageal reflux disease without esophagitis: Secondary | ICD-10-CM

## 2022-05-11 DIAGNOSIS — K449 Diaphragmatic hernia without obstruction or gangrene: Secondary | ICD-10-CM

## 2022-05-11 DIAGNOSIS — H9190 Unspecified hearing loss, unspecified ear: Secondary | ICD-10-CM

## 2022-05-11 DIAGNOSIS — D72819 Decreased white blood cell count, unspecified: Secondary | ICD-10-CM

## 2022-05-11 DIAGNOSIS — R32 Unspecified urinary incontinence: Secondary | ICD-10-CM

## 2022-05-11 DIAGNOSIS — L900 Lichen sclerosus et atrophicus: Secondary | ICD-10-CM

## 2022-05-11 DIAGNOSIS — R1310 Dysphagia, unspecified: Secondary | ICD-10-CM

## 2022-05-11 DIAGNOSIS — E785 Hyperlipidemia, unspecified: Secondary | ICD-10-CM

## 2022-05-11 DIAGNOSIS — I890 Lymphedema, not elsewhere classified: Secondary | ICD-10-CM

## 2022-05-11 DIAGNOSIS — N904 Leukoplakia of vulva: Secondary | ICD-10-CM

## 2022-07-14 ENCOUNTER — Encounter: Admit: 2022-07-14 | Discharge: 2022-07-14 | Payer: MEDICARE

## 2022-07-14 MED ORDER — CLOBETASOL 0.05 % TP OINT
OPHTHALMIC | 1 refills | 30.00000 days | Status: AC
Start: 2022-07-14 — End: ?

## 2022-07-18 ENCOUNTER — Encounter: Admit: 2022-07-18 | Discharge: 2022-07-18 | Payer: MEDICARE

## 2022-07-18 DIAGNOSIS — I2510 Atherosclerotic heart disease of native coronary artery without angina pectoris: Secondary | ICD-10-CM

## 2022-07-18 DIAGNOSIS — R931 Abnormal findings on diagnostic imaging of heart and coronary circulation: Secondary | ICD-10-CM

## 2022-07-18 LAB — LIPID PROFILE
CHOLESTEROL/HDL %: 3
CHOLESTEROL: 116
HDL: 43
LDL: 56
TRIGLYCERIDES: 89
VLDL: 18

## 2022-07-20 ENCOUNTER — Encounter: Admit: 2022-07-20 | Discharge: 2022-07-20 | Payer: MEDICARE

## 2022-07-20 DIAGNOSIS — I2510 Atherosclerotic heart disease of native coronary artery without angina pectoris: Secondary | ICD-10-CM

## 2022-07-20 DIAGNOSIS — Z136 Encounter for screening for cardiovascular disorders: Secondary | ICD-10-CM

## 2022-07-20 DIAGNOSIS — K449 Diaphragmatic hernia without obstruction or gangrene: Secondary | ICD-10-CM

## 2022-07-20 DIAGNOSIS — K219 Gastro-esophageal reflux disease without esophagitis: Secondary | ICD-10-CM

## 2022-07-20 DIAGNOSIS — R32 Unspecified urinary incontinence: Secondary | ICD-10-CM

## 2022-07-20 DIAGNOSIS — R1310 Dysphagia, unspecified: Secondary | ICD-10-CM

## 2022-07-20 DIAGNOSIS — D696 Thrombocytopenia, unspecified: Secondary | ICD-10-CM

## 2022-07-20 DIAGNOSIS — Z0389 Encounter for observation for other suspected diseases and conditions ruled out: Secondary | ICD-10-CM

## 2022-07-20 DIAGNOSIS — E785 Hyperlipidemia, unspecified: Secondary | ICD-10-CM

## 2022-07-20 DIAGNOSIS — I872 Venous insufficiency (chronic) (peripheral): Secondary | ICD-10-CM

## 2022-07-20 DIAGNOSIS — R600 Localized edema: Secondary | ICD-10-CM

## 2022-07-20 DIAGNOSIS — N390 Urinary tract infection, site not specified: Secondary | ICD-10-CM

## 2022-07-20 DIAGNOSIS — C50311 Malignant neoplasm of lower-inner quadrant of right female breast: Secondary | ICD-10-CM

## 2022-07-20 DIAGNOSIS — N904 Leukoplakia of vulva: Secondary | ICD-10-CM

## 2022-07-20 DIAGNOSIS — E7800 Pure hypercholesterolemia, unspecified: Secondary | ICD-10-CM

## 2022-07-20 DIAGNOSIS — D72819 Decreased white blood cell count, unspecified: Secondary | ICD-10-CM

## 2022-07-20 DIAGNOSIS — H9190 Unspecified hearing loss, unspecified ear: Secondary | ICD-10-CM

## 2022-07-20 DIAGNOSIS — L900 Lichen sclerosus et atrophicus: Secondary | ICD-10-CM

## 2022-07-20 DIAGNOSIS — Z789 Other specified health status: Secondary | ICD-10-CM

## 2022-07-20 DIAGNOSIS — I10 Essential (primary) hypertension: Secondary | ICD-10-CM

## 2022-07-20 DIAGNOSIS — C4490 Unspecified malignant neoplasm of skin, unspecified: Secondary | ICD-10-CM

## 2022-07-20 DIAGNOSIS — C50919 Malignant neoplasm of unspecified site of unspecified female breast: Secondary | ICD-10-CM

## 2022-07-20 DIAGNOSIS — R931 Abnormal findings on diagnostic imaging of heart and coronary circulation: Secondary | ICD-10-CM

## 2022-07-20 MED ORDER — FUROSEMIDE 20 MG PO TAB
20 mg | ORAL_TABLET | Freq: Every morning | ORAL | 0 refills | 90.00000 days | Status: AC
Start: 2022-07-20 — End: ?

## 2022-07-20 MED ORDER — SPIRONOLACTONE 25 MG PO TAB
25 mg | ORAL_TABLET | Freq: Every day | ORAL | 1 refills | 90.00000 days | Status: AC
Start: 2022-07-20 — End: ?

## 2022-07-20 NOTE — Patient Instructions
It was nice to see you in clinic today.    We discussed:  Your cholesterol and blood pressure look to be doing very well.   Your leg swelling which is likely due to some vein damage. We will look at that with an ultrasound and have our vein and edema specialists evaluate it further.     I would like to make the following medication adjustments:  Start spironolactone 25mg  daily.   Start lasix 20mg  daily for 3 days and then only take it on an as needed basis for weight gain >3lb with fluid or worsening edema.     Otherwise continue the same medications as you have been doing.    We will be pursuing the following tests after your appointment today:  Leg venous insufficiency study.   Labs to include NT pro BNP, magnesium and chemistry in 2 weeks.     I will plan to see you back in about 12 months.  Please call us in the meantime with any questions or concerns.    To get a hold of the [employer], call the [place] nursing line at [phone].  Leave a detailed message for the nurses in [place] with how we can assist you and we will call you back.     If you have not heard or been advised of MyChart results of your testing in more than 1 week after it has been performed, please give [place] a call so that we may investigate further.    Lifestyle Modification recommendations:  Please continue to take your prescribed medications at the scheduled times on a regular basis.    Regular aerobic and weight bearing exercise is recommended at least 5 days a week for at least 30 minutes with a goal to exercise for 60 minutes. Ideally, exercise should be done daily if possible.     Eat a heart healthy, mediterranean style diet, focusing on fresh fruit, vegetables, nuts, legumes and avoidance of red or processed meat.   Limit the salt in your diet to less than 2,400 mg daily and calories in your diet.   Avoid high saturated fat/high cholesterol foods.  Increase soluble fiber intake as tolerated.     Limit alcohol intake to 1 drink daily for women and 2 drinks daily for men (1 drink = 5 ounces of wine, 12 ounces of beer, or 1 ounce of liquor).    Try to maintain an ideal body weight.    Do not smoke and avoid environments where people are smoking.    The American Heart Association has a great website with lots of important patient information about heart disease and other medical conditions including: high blood pressure, cholesterol disorders, diabetes mellitus, and strokes.  I highly recommend you visit their site: www.heart.org    Follow up regularly with your primary care physician.

## 2022-07-20 NOTE — Progress Notes
Date of Service: 07/20/2022    Teresa Trevino is a 67 y.o. female.         History of present illness: Teresa Trevino is a very pleasant 67 y.o. female who is being seen at the [facility] [place] [facility] Department at the [facility] office.      Pertinent past medical history includes coronary artery disease as manifested by calcium score of 236 in 2020, familial hypercholesterolemia, statin intolerance, right-sided breast cancer status post radiation and chemotherapy (no anthracyclines), recurrent a UTI, previous bladder injury with hysterectomy, mid urethral sling.  She is known to have venous insufficiency.  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  This was complicated by a significant cellulitis.    She presents with ongoing concerns about swelling in her foot. The patient reports a history of cellulitis infection in the left lower extremity earlier this year and underwent a prolonged antibiotic course, which has since resolved, but the persistent swelling remains a concern. Despite a week-long course of Lasix, the patient notes no significant change in the swelling.    The patient's foot swelling was previously known and she was seen to have venous insufficiency several years ago, but this has significantly worsened following radiation treatment for a carcinoma on her leg. The swelling has been exacerbated by a recent bout of cellulitis, which developed after a water shoe caused a sore on the bottom of her foot. Despite treatment with Levaquin and hospitalization for IV clindamycin which did clear the infection, but the swelling persist.  She has taken Lasix off and on but has not noticed a significant change.    The patient's cholesterol issues appear to be well-managed with Repatha and a weekly rosuvastatin, despite a known intolerance to statins. The patient's blood pressure is slightly elevated, but the patient had not yet taken her daily dose of Losartan at the time of the consultation.    The patient has been wearing a compression sock to manage the swelling and plans to continue doing so. She has not experienced any recurrent cellulitis, pain, or bleeding in the foot since the initial infection.     Assessment:  Coronary artery disease as manifested by calcium score of 236 in 2020  Familial hypercholesterolemia  Statin intolerance  Right-sided breast cancer status post radiation and chemotherapy without anthracyclines  Left lower extremity edema: Venous insufficiency compounded by previous radiation and significant cellulitis episode  Hypertension    Plan:      Lower Extremity Edema: Persistent swelling in the right foot following cellulitis and radiation therapy for carcinoma. Lasix provided minimal relief. Patient is compliant with compression use.  -Order venous Doppler to assess for venous insufficiency.  -Start Spironolactone 25 mg daily.  -Continue Lasix 20 mg for three days, then switch to as-needed basis for weight gain and worsening edema.  -Refer to Dr. Trixie Dredge in the vascular and edema clinic for further evaluation.  -Continue conservative measures including compression stockings, sodium restriction and elevation.    Hyperlipidemia: Well controlled with Repatha and once-weekly Rosuvastatin. Recent LDL levels are satisfactory.  -Continue current regimen.    Hypertension: Blood pressure slightly elevated during visit, likely due to not having taken Losartan yet. Recent readings have been within normal range.  -Continue Losartan as prescribed.    We discussed ongoing lifestyle modifications including >150 minutes of aerobic and weight bearing exercise weekly in addition to a preferentially plant based Mediterranean-style, sodium and calorie restricted diet.  Thank you for allowing [place] to care for this patient. If there are any questions or concerns, please don't hesitate to contact us.     We will see the patient back in return in in 1 year(s) time, or sooner if needed.     Alda Lea, DO, [facility]  Staff Cardiologist  Department of Cardiovascular Medicine  [facility]    Orders Placed This Encounter    NT-PRO-BNP    MAGNESIUM    COMPREHENSIVE METABOLIC PANEL    AMB REFERRAL TO VASCULAR SURGERY    ECG 12-LEAD    spironolactone (ALDACTONE) 25 mg tablet    furosemide (LASIX) 20 mg tablet    PV VENOUS INSUFFICIENCY LOWER EXTREMITY BILATERAL       CURRENT MEDICATIONS:(including today's revisions)   acetaminophen (TYLENOL EXTRA STRENGTH) 500 mg tablet Take one tablet by mouth as Needed for Pain. Max of 4,000 mg of acetaminophen in 24 hours.    cholecalciferol (vitamin D3) (OPTIMAL D3) 50,000 units capsule TAKE ONE CAPSULE BY MOUTH EVERY WEEK    clobetasoL (TEMOVATE) 0.05 % topical ointment apply topically to affected area TWICE DAILY for 2-4 weeks Then taper to THREE TIMES A WEEK AS NEEDED (Patient taking differently: three times weekly. apply topically TO affected AREA TWICE DAILY FOR 2-4 WEEKS THEN taper TO THREE TIMES A WEEK AS NEEDED)    CRESTOR 5 mg tablet TAKE ONE TABLET BY MOUTH EVERY 7 DAYS    estradioL (ESTRACE) 0.01 % (0.1 mg/g) vaginal cream Insert or Apply one g to vaginal area twice weekly.    furosemide (LASIX) 20 mg tablet Take one tablet by mouth every morning. Take daily for 3 days and then on an as needed basis (>3lb weight gain and worsening edema).    ibuprofen (ADVIL) 200 mg tablet Take one tablet by mouth as Needed for Pain. Take with food.    losartan (COZAAR) 50 mg tablet Take one tablet by mouth daily.    omeprazole DR (PRILOSEC) 20 mg capsule Take one capsule by mouth daily before breakfast.    phenazopyridine (PYRIDIUM) 200 mg tablet Take one tablet by mouth three times daily as needed for Pain. Take after meals for up to 2 days.    REPATHA SYRINGE 140 mg/mL injectable SYRINGE inject 1ml under the skin every 14 days    spironolactone (ALDACTONE) 25 mg tablet Take one tablet by mouth daily. Take with food.       ALLERGIES:  Allergies   Allergen Reactions    Bactrim [Sulfamethoxazole-Trimethoprim] NAUSEA AND VOMITING and SEE COMMENTS     Makes me feel ill    Erythromycin NAUSEA AND VOMITING    Macrobid [Nitrofurantoin Monohyd/M-Cryst] NAUSEA AND VOMITING    Sulfa (Sulfonamide Antibiotics) NAUSEA AND VOMITING       Latest cardiovascular testing includes:  Most recent results for 12-Lead ECG   ECG 12-LEAD    Collection Time: 07/20/22 11:03 AM   Result Value Status    VENTRICULAR RATE 87 Final    P-R INTERVAL 168 Final    QRS DURATION 70 Final    Q-T INTERVAL 326 Final    QTC CALCULATION (BAZETT) 392 Final    P AXIS 56 Final    R AXIS 34 Final    T AXIS 54 Final    Impression    Normal sinus rhythm  Low voltage QRS  Borderline ECG  When compared with ECG of 03-Jun-2021 16:20,  No significant change was found  Confirmed by Alda Lea ([id number])  on 07/20/2022 11:09:40 AM     Last Stress test: 09/06/2021:  1. This study is normal. No definite fixed or reversible perfusion defects   are present on the static image data sets.   2. The resting left ventricular ejection fraction of 73% improved to 75%   during peak stress without regional wall motion abnormalities. The left   ventricular end-diastolic volume at rest is 88 mL.   3. Mild to moderate coronary artery calcification. The coronary calcium is   primarily located in the LAD.Marland Kitchen   4. Normal resting and peak stress myocardial blood flow both regionally   and globally. Myocardial blood flow reserve is normal both regionally and   globally. This implies a low risk for future adverse cardiovascular   events..   5. Nonischemic pharmacological stress electrocardiogram.     Last heart catheterization:    Last echocardiogram: 09/16/2020:  Normal LV size and systolic function with an estimated ejection fraction of 60%  Normal RV size and function  The atria are normal in size  Normal valve morphology  Mild mitral regurgitation is present  Nondilated IVC  Normal estimated PA systolic pressure    Last CT/MRI: 09/25/2015:  1.  Mild to moderate but hemodynamically insignificant flat eccentric   calcific plaque throughout the proximal LAD. The proximal LAD fractional   flow reserve is 0.96. There is a short intracameral segment of the distal   LAD, a normal variant. By [facility] analysis there is no apparent   hemodynamically significant stenosis throughout the LAD and its major   diagonal branches. In the distal portions of the LAD and diagonal the   estimated FFR falls within the normal range at 0.85-0.86.   2.  The left circumflex is large but technically nondominant with no   significant hemodynamic obstruction. The small distal AV groove branch has   a borderline low normal FFR equal to 0.80.   3.  Technically right coronary artery has no significant hemodynamic   lesions no significant soft plaque. There is slight calcification of the   proximal portion of the right coronary artery but the distal FFR in the   PDA branch is in the normal range at 0.90.   4.  The visualized thoracic aorta is unremarkable.   5.  The pericardium is unremarkable there is no pericardial effusion.   6.  Left ventricular ejection fraction 56% with no regional wall motion   abnormality.     Left lower extremity venous study on 04/13/2020:  Conclusions: No evidence of deep venous thrombosis bilaterally.   The right great saphenous vein has venous reflux isolated below the knee.   There is a 3.70mm perforator with venous reflux located 25cm from the right   heel.   There is a 3.49mm perforator with venous reflux located 15cm from the right   heel.   The left great saphenous vein has venous reflux.   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   IMPRESSION   Right: No evidence of deep vein thrombosis nor deep venous reflux of the   common femoral, proximal profunda, femoral, or popliteal veins. There is   no evidence of great or small saphenous vein thrombosis. There is great   saphenous venous reflux. There is no evidence of small saphenous venous   insufficiency. There is an anterior duplicate great saphenous vein without   reflux. The anterior duplicate great saphenous vein exits the fascia 10cm   from the junction. There is a  3.8mm perforator with 3.5 seconds of venous   reflux located 25cm from the heel. There is another 3.30mm perforator with   2.7 seconds of venous reflux located 15cm from the heel. Varicose   tributaries with >0.5 seconds of reflux below the knee measuring 1.61mm -   2.43mm.   Left: No evidence of deep vein thrombosis nor deep venous reflux of the   common femoral, proximal profunda, femoral, or popliteal veins. There is   no evidence of great or small saphenous vein thrombosis. There is great   saphenous venous reflux. There is no evidence of small saphenous venous   insufficiency. There is a posterior duplicate great saphenous vein without   reflux. The posterior duplicate great saphenous vein exits the fascia 10cm   from the junction. There is a 3.68mm perforator without evidence of venous   reflux located 20cm from the heel. There were no venous tributaries seen   on today's exam.   General: The patient was scanned in either standing position/reverse   trendelenburg/or sitting with augmentation/Valsalva maneuvers to evaluate   the saphenous systems.    VENOUS REFLUX CRITERIA: Venous reflux is   demonstrated by audio and visual retrograde flow greater than 500   milliseconds in the great and small saphenous veins.  Reflux severity   stratified as:   MILD  0.5 - 1 seconds,  MODERATE  1 - 2 seconds,  SEVERE    >2 seconds. Venous tributaries documented demonstrate >0.5 seconds of   reflux.    Recommendations: Consider endovenous ablation of the left great saphenous   vein.   Consider endovenous ablation of the right perforator veins.   Consider treatment of the right leg accessory saphenous branch veins   producing varicose tributaries with microphlebectomy or ultrasound guided   microfoam injections if clinically indicated.     Last Lipid profile and pertinent labs:   Lab Level  Lab Results   Component Value Date    CHOL 116 07/18/2022     Lab Results   Component Value Date    LDL 56 07/18/2022     No results found for: LIPOPROTA  Lab Results   Component Value Date    HDL 43 07/18/2022     No results found for: NONHDLCHOL  Lab Results   Component Value Date    TRIG 89 07/18/2022     Hemoglobin   Date Value Ref Range Status   07/04/2022 12.0  Final     Platelet Count   Date Value Ref Range Status   07/04/2022 151 (L) 182 - 369 Final     Creatinine   Date Value Ref Range Status   07/04/2022 0.72  Final     Potassium   Date Value Ref Range Status   07/04/2022 4.3  Final     TSH   Date Value Ref Range Status   09/09/2020 1.01  Final     AST (SGOT)   Date Value Ref Range Status   07/03/2022 16  Final     ALT (SGPT)   Date Value Ref Range Status   07/03/2022 18  Final     Magnesium   Date Value Ref Range Status   07/03/2022 1.6  Final        Total time spent on today's office visit was >31 minutes including >50% of time time involved in counseling.  This includes face-to-face in person visit with patient as well as nonface-to-face time including review of the EMR, outside records, labs,  radiologic studies, echocardiogram & other cardiovascular studies, formation of treatment plan, after visit summary, future disposition, and lastly on documentation.    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  5.65min o9n TM, no CP. EF 59%, no ischemia  03/17  Ex Echo at [facility] - no ischemia, 6 min on TM, TDS, no CP, no ECG changes, EF normal  06/17  Coronary Artery AJ-130 Score: Total 234.  LAD 206, RCA 28  08/17 CT Cor angiogram at [place] EF 56%, chambers and valves normal, LAD-moderate concentric flat plaque, FFR 0.86; LCx mild plaque, RCA normal.  Thoracic aorta normal.      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 Wisby is a caucasian female who presented to the [facility] on 01/25/2017 at age 63 for evaluation of right breast cancer. Ms. Rochell had no complaints prior to her screening mammogram. A new right breast asymmetry was present on screening mammogram. There are was suspicious on diagnostic imaging and biopsy was recommended. Right breast sono-guided biopsy 01/19/17 ([place]) revealed grade 3 invasive ductal carcinoma. Ms. Bayona underwent right RSL lumpectomy/SLNB on 07/06/17. She finished radiation with Dr. Ardeth Perfect on 10/05/17.    PATHOLOGY:  Tumor:  Size/Extent of Tumor Bed: 1.2 x 0.6 cm   Size/Extent of Residual Invasive Tumor: No invasive tumor identified   Overall Residual Cellularity of the Tumor Bed: <1%   1.5 mm DCIS present  Margins Free From Tumor:  Yes  ER:  negative   PR:  negative    Her 2:  negative  Grade:  3  Lymph Nodes:  0/4  LVSI:  no  Extranodal extension:  no      BREAST IMAGING:  Mammogram:    -- Bilateral screening mammogram 12/20/16 ([place]) revealed scattered fibroglandular densities. There was a focal asymmetry present within the central posterior right breast. Additional diagnostic images and ultrasound were recommended. No abnormalities were present on the left breast.  -- Right diagnostic mammogram 01/04/17 ([place]) revealed an approximately 1.2 cm x 0.6 cm x 0.5 cm low-density mass at 4:00 with associated calcifications. This was considered suspicious and further evaluation  with ultrasound will be performed.    Ultrasound:    -- Targeted right breast ultrasound 01/04/17 ([place]) revealed at 4:00 there was an irregular hypoechoic mass measuring approximately 6 mm x 9 mm x 8 mm with associated microcalcifications. This was thought to likely correlate with the mammographic abnormality and was considered somewhat suspicious. Ultrasound-guided biopsy was recommended. If the clip marker form sono guided biopsy of this mass did not correlate to the mammographic finding, additional stereotactic biopsy might be required. These findings recommendations were discussed in detail with the patient at the time of today's procedure.     REPRODUCTIVE HEALTH:  Age at first Menarche: Unknown   Age at Menopause:  Hysterectomy/BSO at 77, HRT cream for several months    PROCEDURE: Right RSL lumpectomy/SLNB, 07/06/17  PERTINENT PMH:  HTN, chronic leukopenia  FAMILY HISTORY:  Maternal Grandmother- Breast cancer dx age late 73s  PHYSICAL EXAM on PRESENTATION: Right - No palpable breast masses. No skin, nipple, or areolar change. Left - No palpable breast masses. No skin, nipple, or areolar change. No supraclavicular or axillary adenopathy.   MEDICAL ONCOLOGY:  Dr. Olin Pia NEOADJUVANT THERAPY: Taxotere/carbo completed 06/01/17  REFERRED BY:  Melissa Huntington PA        Nocturia 01/2017    Post-void dribbling 01/2017    Lichen sclerosus et atrophicus of the vulva 09/22/2015    Agatston coronary artery calcium score between 200 and 399 08/21/2015 Hypercholesterolemia 08/20/2015    Vulvar lesion 03/12/2015    Recurrent UTI 09/11/2014    Dyspareunia, female 05/13/2013    Vaginal atrophy 05/13/2013    Urge incontinence of urine 05/13/2013    Mixed incontinence urge and stress (female)(female) 10/19/2011         Review of Systems   Constitutional: Negative.   HENT:  Positive for tinnitus.    Eyes: Negative.    Cardiovascular:  Positive for dyspnea on exertion and leg swelling.   Respiratory: Negative.     Endocrine: Negative.    Hematologic/Lymphatic: Negative.    Skin:  Positive for rash.        Cellulitis   Musculoskeletal:  Positive for back pain, muscle cramps and myalgias.   Gastrointestinal:  Positive for heartburn.   Genitourinary:         Frequent UTI's   Neurological: Negative.    Psychiatric/Behavioral: Negative.     Allergic/Immunologic: Negative.        Physical Exam   Constitutional: She appears well-developed. No distress.   HENT:   Head: Normocephalic and atraumatic.   Mouth/Throat: Mucous membranes are moist.   Eyes: No scleral icterus.   Neck: No JVD present. Carotid bruit is not present.   Cardiovascular: Normal rate and regular rhythm.   No murmur heard.  Scarring on the LLE anterior shin. Varicosities and spider veins.    Pulmonary/Chest: Effort normal and breath sounds normal. No respiratory distress. She has no wheezes.   Abdominal: Soft. Bowel sounds are normal. She exhibits no distension. There is no abdominal tenderness.   Musculoskeletal:      Right lower leg: No edema.      Left lower leg: 2+ Edema present.   Neurological: She is alert and oriented to person, place, and time. Coordination normal.   Skin: Skin is warm and dry. She is not diaphoretic. No pallor.   Psychiatric: Her behavior is normal. Mood, judgment and thought content normal.         Cardiovascular Studies      Cardiovascular Health Factors  Vitals BP Readings from Last 3 Encounters:   07/20/22 (!) 130/90   05/11/22 128/79   04/20/22 137/80     Wt Readings from Last 3 Encounters:   07/20/22 92.9 kg (204 lb 12.8 oz)   05/11/22 92.2 kg (203 lb 3.2 oz)   04/20/22 91.9 kg (202 lb 9.6 oz)     BMI Readings from Last 3 Encounters:   07/20/22 31.14 kg/m?   05/11/22 31.59 kg/m?   04/20/22 31.50 kg/m?      Smoking Social History     Tobacco Use   Smoking Status Never   Smokeless Tobacco Never      Lipid Profile Cholesterol   Date Value Ref Range Status   07/18/2022 116  Final     HDL   Date Value Ref Range Status   07/18/2022 43  Final     LDL   Date Value Ref Range Status   07/18/2022 56  Final     Triglycerides   Date Value Ref Range Status   07/18/2022 89  Final      Blood Sugar No results found for: HGBA1C  Glucose   Date Value Ref Range Status   07/04/2022 100  Final   07/03/2022 100  Final   08/04/2021 81  Final          Problems Addressed Today  Encounter Diagnoses   Name Primary?    Screening for heart disease Yes    Venous insufficiency of left leg     Essential hypertension     Coronary artery disease involving native coronary artery of native heart without angina pectoris     Malignant neoplasm of lower-inner quadrant of right breast of female, estrogen receptor negative (HCC)     Agatston coronary artery calcium score between 200 and 399     Hypercholesterolemia     Statin intolerance     Leg edema, left                  Current Medications (including today's revisions)   acetaminophen (TYLENOL EXTRA STRENGTH) 500 mg tablet Take one tablet by mouth as Needed for Pain. Max of 4,000 mg of acetaminophen in 24 hours.    cholecalciferol (vitamin D3) (OPTIMAL D3) 50,000 units capsule TAKE ONE CAPSULE BY MOUTH EVERY WEEK    clobetasoL (TEMOVATE) 0.05 % topical ointment apply topically to affected area TWICE DAILY for 2-4 weeks Then taper to THREE TIMES A WEEK AS NEEDED (Patient taking differently: three times weekly. apply topically TO affected AREA TWICE DAILY FOR 2-4 WEEKS THEN taper TO THREE TIMES A WEEK AS NEEDED)    CRESTOR 5 mg tablet TAKE ONE TABLET BY MOUTH EVERY 7 DAYS estradioL (ESTRACE) 0.01 % (0.1 mg/g) vaginal cream Insert or Apply one g to vaginal area twice weekly.    furosemide (LASIX) 20 mg tablet Take one tablet by mouth every morning. Take daily for 3 days and then on an as needed basis (>3lb weight gain and worsening edema).    ibuprofen (ADVIL) 200 mg tablet Take one tablet by mouth as Needed for Pain. Take with food.    losartan (COZAAR) 50 mg tablet Take one tablet by mouth daily.    omeprazole DR (PRILOSEC) 20 mg capsule Take one capsule by mouth daily before breakfast.    phenazopyridine (PYRIDIUM) 200 mg tablet Take one tablet by mouth three times daily as needed for Pain. Take after meals for up to 2 days.    REPATHA SYRINGE 140 mg/mL injectable  SYRINGE inject 1ml under the skin every 14 days    spironolactone (ALDACTONE) 25 mg tablet Take one tablet by mouth daily. Take with food.

## 2022-07-26 ENCOUNTER — Encounter: Admit: 2022-07-26 | Discharge: 2022-07-26 | Payer: MEDICARE

## 2022-08-01 ENCOUNTER — Encounter: Admit: 2022-08-01 | Discharge: 2022-08-01 | Payer: MEDICARE

## 2022-08-01 NOTE — Progress Notes
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     This letter is to let you know that the above STK11 variant has been reanalyzed, and the interpretation of the result has been changed from variant of uncertain significance (VUS) to benign (does not increase cancer risk).  A copy of this updated test report from [facility], the lab who originally performed the test, is available in your MyChart.    The interpretations of the other variants identified from your previous testing, in the MUTYH and TERT genes, have not changed at this time.     No changes to your clinical management are needed based on this updated interpretation of the genetic test result.    Laboratories that perform genetic testing periodically review / reinterpret genetic test results. A Variant of Uncertain Significance (VUS) is a change in a gene that does not have enough information available at the time of testing to classify it as positive (cancer-causing) or negative (harmless).    As genetic information increases, reclassifications are made based on new publications in the medical literature, new technology, and new ways to analyze the genes and their functions.  As more individuals are tested, more knowledge is added to the international databases.     As a review, we call a change in the normal sequence of a gene a variant. Some variants are like typographical errors, with an alteration in the sequence of the gene. Other variants are deletions or insertions, where genetic material is missing or added to a gene. Both sequence alterations and deletion/insertions have the potential to alter or disrupt the gene?s function. On the other hand, some variants do not alter the gene product and are tolerated. Thus, any specific variant needs to be evaluated and categorized by the testing laboratory.      Genetic Variants fall into five categories:        PATHOGENIC: A variant known to be disease causing, often because it has been seen in families with the condition before. Other times the variant may be the first of that type ever reported but, because of how the variant affects the gene, it is predicted to be disease-causing.      LIKELY PATHOGENIC: This is a variant that falls a bit short of absolute proof that it is disease causing. The likelihood that it is pathogenic is greater than 90%.      VARIANT OF UNCERTAIN SIGNIFICANCE (VUS): A VUS is a change in the gene that has either never been seen before or has been seen so few times that it is not clear if the changed is associated with the condition or not. Data is either not available or inconsistent for whether the change affects the protein. The expectation is that most VUS' will eventually have enough data to be reclassified to pathogenic or benign.      LIKELY BENIGN: These are variants where the likelihood is greater than 90% that the gene is not affected in function.      BENIGN: There is strong evidence that the variant does not affect the gene?s function. Often this is because the change has been seen in studies of healthy individuals, or because the change is unlikely to alter the protein product of the gene.      New genetic test panels may have  become available since your testing was performed. Additional genetic testing for you, or your family members, may be considered, depending on your personal and/or family history of cancer, and any new family diagnoses (see information about new panel genetic tests below).     For example, if any of the following apply to you, you may wish to consider additional genetic testing:    If you had testing for a limited number of genes and have a strong family history of cancer  If your testing was before June 2013 and only included sequencing of one or two genes (like only BRCA1 and BRCA2)  If you were diagnosed with another cancer after completing this genetic test  If a close family member has more than 10 - 20 colon polyps  If other family members have been diagnosed with cancer after your testing, especially if   the relative was under age 66 at diagnosis  the relative has a cancer associated with your cancer or another hereditary cancer syndrome  the relative has a cancer for which new genetic testing, or a new insurance guideline, is available (for example, kidney, adrenal, prostate, uterine, or ovarian cancer, melanoma, many other cancers)      Questions?  Please let us know if you have any questions about this amended report. You can request a new referral to Genetic Counseling  and be contacted to schedule an appointment to thoroughly review your current family history, past testing, and any additional testing options.     What else can you do with this information?  Consider entering data into one or all of the following registries to advance knowledge of genetic disorders by sharing de-identifying genetic and health information.    These online registries are a joint effort between laboratories and academic medical centers to learn more about genes, with the help of patients who have had genetic panel testing and are found to have variants of uncertain significance. While all of the genes on the panels are associated with and increased risk of cancer, the understanding of risks associated with some of the genes are understood more than the others. Participation involves completing online surveys and patients can enroll directly through these websites:    PROMPT registry www.promptstudy.org  GenomeConnect  www.genomeconnect.org   ClinVar (SimpleSpeech.is) or other variant classification database    Family members can find a Dentist through these professional sites:  [employer] (CartridgeClearance.com.cy) Find a Patent attorney button  [employer] (IronJet.at) ?Find a Dentist? button    Best,    [employer]

## 2022-08-04 ENCOUNTER — Encounter: Admit: 2022-08-04 | Discharge: 2022-08-04 | Payer: MEDICARE

## 2022-08-04 DIAGNOSIS — Z136 Encounter for screening for cardiovascular disorders: Secondary | ICD-10-CM

## 2022-08-04 DIAGNOSIS — R931 Abnormal findings on diagnostic imaging of heart and coronary circulation: Secondary | ICD-10-CM

## 2022-08-04 DIAGNOSIS — C50311 Malignant neoplasm of lower-inner quadrant of right female breast: Secondary | ICD-10-CM

## 2022-08-04 DIAGNOSIS — E7800 Pure hypercholesterolemia, unspecified: Secondary | ICD-10-CM

## 2022-08-04 DIAGNOSIS — Z789 Other specified health status: Secondary | ICD-10-CM

## 2022-08-04 DIAGNOSIS — R600 Localized edema: Secondary | ICD-10-CM

## 2022-08-04 DIAGNOSIS — I10 Essential (primary) hypertension: Secondary | ICD-10-CM

## 2022-08-04 DIAGNOSIS — I2510 Atherosclerotic heart disease of native coronary artery without angina pectoris: Secondary | ICD-10-CM

## 2022-08-04 DIAGNOSIS — I872 Venous insufficiency (chronic) (peripheral): Secondary | ICD-10-CM

## 2022-08-04 LAB — COMPREHENSIVE METABOLIC PANEL
ALBUMIN: 3.9
ALK PHOSPHATASE: 47
ALT: 23
ANION GAP: 9
AST: 22
BLD UREA NITROGEN: 17
CALCIUM: 9.2
CHLORIDE: 103
CO2: 25
CREATININE: 0.8
GFR ESTIMATED: 79
GLUCOSE,PANEL: 93
POTASSIUM: 4.1
SODIUM: 137
TOTAL BILIRUBIN: 0.5
TOTAL PROTEIN: 6.5

## 2022-08-04 LAB — MAGNESIUM: MAGNESIUM: 1.6

## 2022-08-23 ENCOUNTER — Encounter: Admit: 2022-08-23 | Discharge: 2022-08-23 | Payer: MEDICARE

## 2022-08-23 MED ORDER — SPIRONOLACTONE 25 MG PO TAB
25 mg | ORAL | 0 refills | 90.00000 days | Status: AC
Start: 2022-08-23 — End: ?

## 2022-08-23 NOTE — Telephone Encounter
-----   Message from Agustin Cree, DO sent at 08/23/2022 12:09 PM CDT -----  Regarding: RE: medication  Okay to reduce Aldactone to every other day.  ----- Message -----  From: Weston Brass  Sent: 08/23/2022  11:51 AM CDT  To: Renato Shin, DO  Subject: medication                                       Patient called about labs since starting aldactone.  States no real difference in edema to leg.  Has appt with vascular. Wanted to know if she could do aldactone every other day.  She is bothered wit frequency of urination.

## 2022-08-29 ENCOUNTER — Encounter: Admit: 2022-08-29 | Discharge: 2022-08-29 | Payer: MEDICARE

## 2022-08-29 ENCOUNTER — Ambulatory Visit: Admit: 2022-08-29 | Discharge: 2022-08-30 | Payer: MEDICARE

## 2022-08-29 DIAGNOSIS — I2510 Atherosclerotic heart disease of native coronary artery without angina pectoris: Secondary | ICD-10-CM

## 2022-08-29 DIAGNOSIS — Z0389 Encounter for observation for other suspected diseases and conditions ruled out: Secondary | ICD-10-CM

## 2022-08-29 DIAGNOSIS — K449 Diaphragmatic hernia without obstruction or gangrene: Secondary | ICD-10-CM

## 2022-08-29 DIAGNOSIS — H9190 Unspecified hearing loss, unspecified ear: Secondary | ICD-10-CM

## 2022-08-29 DIAGNOSIS — D72819 Decreased white blood cell count, unspecified: Secondary | ICD-10-CM

## 2022-08-29 DIAGNOSIS — N390 Urinary tract infection, site not specified: Secondary | ICD-10-CM

## 2022-08-29 DIAGNOSIS — R32 Unspecified urinary incontinence: Secondary | ICD-10-CM

## 2022-08-29 DIAGNOSIS — E785 Hyperlipidemia, unspecified: Secondary | ICD-10-CM

## 2022-08-29 DIAGNOSIS — R1310 Dysphagia, unspecified: Secondary | ICD-10-CM

## 2022-08-29 DIAGNOSIS — I10 Essential (primary) hypertension: Secondary | ICD-10-CM

## 2022-08-29 DIAGNOSIS — L900 Lichen sclerosus et atrophicus: Secondary | ICD-10-CM

## 2022-08-29 DIAGNOSIS — D696 Thrombocytopenia, unspecified: Secondary | ICD-10-CM

## 2022-08-29 DIAGNOSIS — N904 Leukoplakia of vulva: Secondary | ICD-10-CM

## 2022-08-29 DIAGNOSIS — C4490 Unspecified malignant neoplasm of skin, unspecified: Secondary | ICD-10-CM

## 2022-08-29 DIAGNOSIS — C50919 Malignant neoplasm of unspecified site of unspecified female breast: Secondary | ICD-10-CM

## 2022-08-29 DIAGNOSIS — K219 Gastro-esophageal reflux disease without esophagitis: Secondary | ICD-10-CM

## 2022-08-29 MED ORDER — CLOBETASOL 0.05 % TP OINT
OPHTHALMIC | 1 refills | 30.00000 days | Status: AC
Start: 2022-08-29 — End: ?

## 2022-08-29 NOTE — Telephone Encounter
This message is regarding your cardiology appointment 08/31/2022. Please follow the following instructions:     Drink (3)-8 Oz of water 2 hours prior.  No Caffeine the evening before or the morning of.   Remove Compression stockings 48 hours prior to scan.

## 2022-08-29 NOTE — Progress Notes
CHIEF COMPLAINT:   Chief Complaint   Patient presents with    Follow Up       History of Present Illness:   Teresa Trevino is a 67 y.o., female who presents for follow up regarding rUTI.    She was last seen on 04/08/22. Current prophylactic regimen includes vaginal estrogen and methenamine (restarted in 03/2022). She developed GI symptoms within about a week of starting the methenamine and so stopped taking it. She has burning when her urine touches her vulva. She isn't certain whether she has true UTI symptoms.     Since her last visit she was hospitalized for 2.5 days for left foot cellulitis. She felt symptoms of a UTI around then and was initially treated with a Levaquin. Her UTI symptoms resolved but the foot infection did not and she was hospitalized and given clindamycin. She reports that the urine culture at that time was positive. She feels like she is mostly back to normal but still has swelling in her left foot. She was recently started on spironolactone to help with the lower extremity swelling.     She has a history of lichen sclerosus and uses clobetasol and vaginal estrogen.     She reports having a couple of flares in her lichen sclerosus. She has ramped up her clobetasol. She reports a little burning after she voids.       ALLERGIES:  Bactrim [sulfamethoxazole-trimethoprim], Erythromycin, Macrobid [nitrofurantoin monohyd/m-cryst], and Sulfa (sulfonamide antibiotics)    MEDICATIONS:    Current Outpatient Medications:     acetaminophen (TYLENOL EXTRA STRENGTH) 500 mg tablet, Take one tablet by mouth as Needed for Pain. Max of 4,000 mg of acetaminophen in 24 hours., Disp: , Rfl:     cholecalciferol (vitamin D3) (OPTIMAL D3) 50,000 units capsule, TAKE ONE CAPSULE BY MOUTH EVERY WEEK, Disp: , Rfl:     clobetasoL (TEMOVATE) 0.05 % topical ointment, apply topically to affected area TWICE DAILY for 2-4 weeks Then taper to THREE TIMES A WEEK AS NEEDED (Patient taking differently: three times weekly. apply topically TO affected AREA TWICE DAILY FOR 2-4 WEEKS THEN taper TO THREE TIMES A WEEK AS NEEDED), Disp: 30 g, Rfl: 1    CRESTOR 5 mg tablet, TAKE ONE TABLET BY MOUTH EVERY 7 DAYS, Disp: 12 tablet, Rfl: 3    estradioL (ESTRACE) 0.01 % (0.1 mg/g) vaginal cream, Insert or Apply one g to vaginal area twice weekly., Disp: 42.5 g, Rfl: 11    furosemide (LASIX) 20 mg tablet, Take one tablet by mouth every morning. Take daily for 3 days and then on an as needed basis (>3lb weight gain and worsening edema)., Disp: 30 tablet, Rfl: 0    ibuprofen (ADVIL) 200 mg tablet, Take one tablet by mouth as Needed for Pain. Take with food., Disp: , Rfl:     losartan (COZAAR) 50 mg tablet, Take one tablet by mouth daily., Disp: , Rfl:     omeprazole DR (PRILOSEC) 20 mg capsule, Take one capsule by mouth daily before breakfast., Disp: 90 capsule, Rfl: 2    phenazopyridine (PYRIDIUM) 200 mg tablet, Take one tablet by mouth three times daily as needed for Pain. Take after meals for up to 2 days., Disp: 20 tablet, Rfl: 0    REPATHA SYRINGE 140 mg/mL injectable SYRINGE, inject 1ml under the skin every 14 days, Disp: 2 mL, Rfl: 11    spironolactone (ALDACTONE) 25 mg tablet, Take one tablet by mouth every 48 hours. 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  No exercise induced chest pain.  2.  No ECG evidence of ischemia.  3.  No echocardiographic evidence of ischemia.  4.  Normal  Hemodynamic response to exercise. 5.  No significant exercise induced arrhythmias.  6.  Low risk scan.  7.  Consider medical management if indicated.    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    Thrombocytopenia (HCC)     following chemo    Urinary tract infection        Surgical History:   Procedure Laterality Date    TONSILLECTOMY  1977    HX HYSTERECTOMY  2008    BLADDER REPAIR  2008    [facility]    BLADDER SURGERY  2013    bladder sling    CT ANGIOGRAM (NON-INVASIVE)  09/2015    UPPER GASTROINTESTINAL ENDOSCOPY  05/26/2017    RIGHT RADIOACTIVE SEED LOCALIZED LUMPECTOMY Right 07/06/2017    Performed by Ruthy Dick, DO at [facility] OR    IDENTIFICATION SENTINEL LYMPH NODE Right 07/06/2017    Performed by Ruthy Dick, DO at [facility] OR    INJECTION RADIOACTIVE TRACER FOR SENTINEL NODE IDENTIFICATION Right 07/06/2017    Performed by Ruthy Dick, DO at [facility] OR    SENTINEL LYMPH NODE BIOPSY Right 07/06/2017    Performed by Ruthy Dick, DO at [facility] OR    COLONOSCOPY DIAGNOSTIC WITH SPECIMEN COLLECTION BY BRUSHING/ WASHING - FLEXIBLE N/A 02/01/2018    Performed by Virgina Organ, MD at [facility] OR    ESOPHAGOGASTRODUODENOSCOPY WITH BIOPSY - FLEXIBLE N/A 02/01/2018    Performed by Virgina Organ, MD at [facility] OR    ANORECTAL MANOMETRY N/A 03/12/2021    Performed by Eliott Nine, MD at [facility]    COLONOSCOPY      FOOT SURGERY      HX OOPHORECTOMY Bilateral     age 50    HYSTERECTOMY      age 31       Family History   Problem Relation Name Age of Onset    Diabetes Mother      Hypertension Mother      High Cholesterol Mother      Stroke Mother      Thyroid Disease Mother      Kidney Failure Mother          on HD    Diabetes Father          since age 89    Heart Failure Father      None Reported Sister      Cancer Maternal Grandmother      Cancer-Breast Maternal Grandmother      Diabetes Maternal Grandfather      Diabetes Paternal Uncle  Melanoma Neg Hx         Social History     Tobacco Use    Smoking status: Never    Smokeless tobacco: Never Substance Use Topics    Alcohol use: Yes     Alcohol/week: 4.0 standard drinks of alcohol     Types: 2 Shots of liquor, 2 Standard drinks or equivalent per week     Comment: 2 drinks per week           OBJECTIVE:    Physical Examination:  General appearance/Vital Signs: BP 119/74  - Pulse 83  - Temp 36.6 ?C (97.9 ?F) (Temporal)  - Resp 16  - Ht 172.7 cm (5' 8)  - Wt 93.4 kg (206 lb)  - LMP  (LMP Unknown)  - SpO2 98%  - BMI 31.32 kg/m? , not in acute distress, well groomed  Eyes: Sclera white, no nystagmus  Ears/Nose/Throat: Lips and gums appear normal,adequate hearing ability   Psychiatric / Mental status: oriented to time, place and person; affect and mood appropriate; normal interaction  Cardiovascular / Peripheral vascular system: no prominent varicose veins involving the LE, no cyanosis, no visible edema in LE  Chest / Respiratory: normal respiratory effort, unlabored breathing  Musculoskeletal / Extremities: walking with normal gait, normal joint mobility noted visibly in LE bilat  Gastrointestinal / Lower Abdomen: no direct tenderness, soft, nondistended    ASSESSMENT:  Teresa Trevino is a 67 y.o. G39P1 female seen for follow up of rUTIs.    PLAN:  - Continue vaginal estrogen for UTI prophylaxis. She was using methenamine but discontinued after she developed GI symptoms. We did discuss possibly transitioning to low dose antibiotic prophylaxis if rUTIs recur, although she has had so many antibiotics recently with her lower extremity cellulitis, I would recommend holding off for now.   - Urine sent for culture today  - Continue clobetasol for lichen sclerosus  - She has a standing order to use if she develops UTI symptoms  - Follow up in 6 months, sooner PRN

## 2022-08-30 DIAGNOSIS — R3989 Other symptoms and signs involving the genitourinary system: Secondary | ICD-10-CM

## 2022-08-31 ENCOUNTER — Encounter: Admit: 2022-08-31 | Discharge: 2022-08-31 | Payer: MEDICARE

## 2022-08-31 ENCOUNTER — Ambulatory Visit: Admit: 2022-08-31 | Discharge: 2022-08-31 | Payer: MEDICARE

## 2022-08-31 DIAGNOSIS — I2510 Atherosclerotic heart disease of native coronary artery without angina pectoris: Secondary | ICD-10-CM

## 2022-08-31 DIAGNOSIS — I872 Venous insufficiency (chronic) (peripheral): Secondary | ICD-10-CM

## 2022-08-31 DIAGNOSIS — E7800 Pure hypercholesterolemia, unspecified: Secondary | ICD-10-CM

## 2022-08-31 DIAGNOSIS — R600 Localized edema: Secondary | ICD-10-CM

## 2022-08-31 DIAGNOSIS — Z136 Encounter for screening for cardiovascular disorders: Secondary | ICD-10-CM

## 2022-08-31 DIAGNOSIS — C50311 Malignant neoplasm of lower-inner quadrant of right female breast: Secondary | ICD-10-CM

## 2022-08-31 DIAGNOSIS — Z789 Other specified health status: Secondary | ICD-10-CM

## 2022-08-31 DIAGNOSIS — I10 Essential (primary) hypertension: Secondary | ICD-10-CM

## 2022-08-31 DIAGNOSIS — R931 Abnormal findings on diagnostic imaging of heart and coronary circulation: Secondary | ICD-10-CM

## 2022-09-02 ENCOUNTER — Encounter: Admit: 2022-09-02 | Discharge: 2022-09-02 | Payer: MEDICARE

## 2022-09-20 ENCOUNTER — Ambulatory Visit: Admit: 2022-09-20 | Discharge: 2022-09-21 | Payer: MEDICARE

## 2022-09-20 ENCOUNTER — Encounter: Admit: 2022-09-20 | Discharge: 2022-09-20 | Payer: MEDICARE

## 2022-09-20 DIAGNOSIS — I2510 Atherosclerotic heart disease of native coronary artery without angina pectoris: Secondary | ICD-10-CM

## 2022-09-20 DIAGNOSIS — N904 Leukoplakia of vulva: Secondary | ICD-10-CM

## 2022-09-20 DIAGNOSIS — L900 Lichen sclerosus et atrophicus: Secondary | ICD-10-CM

## 2022-09-20 DIAGNOSIS — C50919 Malignant neoplasm of unspecified site of unspecified female breast: Secondary | ICD-10-CM

## 2022-09-20 DIAGNOSIS — H9190 Unspecified hearing loss, unspecified ear: Secondary | ICD-10-CM

## 2022-09-20 DIAGNOSIS — K219 Gastro-esophageal reflux disease without esophagitis: Secondary | ICD-10-CM

## 2022-09-20 DIAGNOSIS — K449 Diaphragmatic hernia without obstruction or gangrene: Secondary | ICD-10-CM

## 2022-09-20 DIAGNOSIS — E785 Hyperlipidemia, unspecified: Secondary | ICD-10-CM

## 2022-09-20 DIAGNOSIS — Z0389 Encounter for observation for other suspected diseases and conditions ruled out: Secondary | ICD-10-CM

## 2022-09-20 DIAGNOSIS — C4490 Unspecified malignant neoplasm of skin, unspecified: Secondary | ICD-10-CM

## 2022-09-20 DIAGNOSIS — D72819 Decreased white blood cell count, unspecified: Secondary | ICD-10-CM

## 2022-09-20 DIAGNOSIS — N390 Urinary tract infection, site not specified: Secondary | ICD-10-CM

## 2022-09-20 DIAGNOSIS — D696 Thrombocytopenia, unspecified: Secondary | ICD-10-CM

## 2022-09-20 DIAGNOSIS — R1310 Dysphagia, unspecified: Secondary | ICD-10-CM

## 2022-09-20 DIAGNOSIS — R32 Unspecified urinary incontinence: Secondary | ICD-10-CM

## 2022-09-20 DIAGNOSIS — B999 Unspecified infectious disease: Secondary | ICD-10-CM

## 2022-09-20 DIAGNOSIS — I10 Essential (primary) hypertension: Secondary | ICD-10-CM

## 2022-09-21 DIAGNOSIS — C50311 Malignant neoplasm of lower-inner quadrant of right female breast: Secondary | ICD-10-CM

## 2022-09-21 DIAGNOSIS — Z136 Encounter for screening for cardiovascular disorders: Secondary | ICD-10-CM

## 2022-09-21 DIAGNOSIS — R600 Localized edema: Secondary | ICD-10-CM

## 2022-09-21 DIAGNOSIS — Z789 Other specified health status: Secondary | ICD-10-CM

## 2022-09-21 DIAGNOSIS — I2510 Atherosclerotic heart disease of native coronary artery without angina pectoris: Secondary | ICD-10-CM

## 2022-09-21 DIAGNOSIS — I10 Essential (primary) hypertension: Secondary | ICD-10-CM

## 2022-09-21 DIAGNOSIS — R931 Abnormal findings on diagnostic imaging of heart and coronary circulation: Secondary | ICD-10-CM

## 2022-09-21 DIAGNOSIS — I890 Lymphedema, not elsewhere classified: Secondary | ICD-10-CM

## 2022-09-21 DIAGNOSIS — Z171 Estrogen receptor negative status [ER-]: Secondary | ICD-10-CM

## 2022-09-21 DIAGNOSIS — I872 Venous insufficiency (chronic) (peripheral): Secondary | ICD-10-CM

## 2022-09-21 DIAGNOSIS — E7800 Pure hypercholesterolemia, unspecified: Secondary | ICD-10-CM

## 2022-09-22 ENCOUNTER — Encounter: Admit: 2022-09-22 | Discharge: 2022-09-22 | Payer: MEDICARE

## 2022-09-22 MED ORDER — ESTRADIOL 0.01 % (0.1 MG/GRAM) VA CREA
11 refills | 30.00000 days | Status: AC
Start: 2022-09-22 — End: ?

## 2022-10-07 ENCOUNTER — Encounter: Admit: 2022-10-07 | Discharge: 2022-10-07 | Payer: MEDICARE

## 2022-11-04 ENCOUNTER — Encounter: Admit: 2022-11-04 | Discharge: 2022-11-04 | Payer: MEDICARE

## 2022-11-04 DIAGNOSIS — K219 Gastro-esophageal reflux disease without esophagitis: Secondary | ICD-10-CM

## 2022-11-04 MED ORDER — OMEPRAZOLE 20 MG PO CPDR
ORAL_CAPSULE | 2 refills | Status: AC
Start: 2022-11-04 — End: ?

## 2022-11-08 NOTE — Progress Notes
Radiation Oncology Follow Up Note  Date: 11/09/2022       Teresa Trevino is a 67 y.o. female.     The primary encounter diagnosis was Radiotherapy follow-up. A diagnosis of Lymphedema of breast was also pertinent to this visit.  Staging:  Cancer Staging   Malignant neoplasm of lower-inner quadrant of right breast of female, estrogen receptor negative (HCC)     Staging form: Breast, AJCC 8th Edition  - Clinical stage from 01/19/2017: Stage IB (cT1b, cN0, cM0, G3, ER-, PR-, HER2-) - Signed by Dimas Alexandria, PA-C on 01/24/2017      History of Present Illness  Teresa Trevino is a 67 y.o. 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 She was recommended to receive adjuvant radiotherapy.      Diagnosis:   Stage IB ER-, PR-, HER2- right breast cancer s/p BCS     Treatment History:   Ms. Teresa Trevino underwent adjuvant radiotherapy to her right breast 09/07/2017 - 10/05/2017. She tolerated treatment well and had no treatment interruptions.       09/28/2017   Course ID C1 RT BREAST   First Treatment Date 09-07-2017 12:45PM   Last Treatment Date 09-28-2017 10:55AM   Treatment Elapsed Days 21   Reference Point ID RT BREAST   Dosage Given To Date 40.05   Session Dosage Given 2.67   Plan ID RT BREAST_FiF   Fractions Treated to Date 15   Total Fractions on Plan 15   Prescribed Dose per Fraction 2.67   Prescription Dose 4,005           10/05/2017   Course ID C1 RT BREAST   First Treatment Date 09-07-2017 12:45PM   Last Treatment Date 10-05-2017 11:50AM   Treatment Elapsed Days 28   Reference Point ID [id number]   Dosage Given To Date 10   Session Dosage Given 2   Plan ID BOOST ELEC   Fractions Treated to Date 5   Total Fractions on Plan 5   Prescribed Dose per Fraction 2   Prescription Dose 1,000      Total Dose: 5,005 cGy       Subjective:       Teresa Trevino returns today for routine follow-up. She is unaccompanied to today's visit. Doing well. She denies breast pain or tenderness. She does have some discomfort of her right lateral chest wall. This occurs randomly but only in some positions. Changing positions helps and the discomfort resolves. This is minimally bothersome for her and it has been ongoing since her lumpectomy. She does have lymphedema of her breast and lateral chest wall but no associated symptoms. Does utilize compression at times. Range of motion of her shoulder is good. Patient denies shortness of air, cough or chills. Patient denies neurologic symptoms such as headache, dizziness or vision changes. Energy levels are okay. She does find that she gets tired in the afternoon but this does not interfere with her lifestyle. She is able to do all of her normal activities. Stays pretty active. She does have some other health problems including with her bladder and venous insufficiency in her legs. She has a lymphedema pump now and this helps. Will establish with the survivorship clinic for annual follow-up but appreciates the reassurance of more frequent follow-up so will continue annual visits here.        Review of Systems   Constitutional:  Positive  for fatigue. Negative for fever.   HENT:  Negative for sore throat and trouble swallowing.    Eyes:  Negative for visual disturbance.   Respiratory:  Negative for cough and shortness of breath.    Cardiovascular:  Negative for chest pain.   Musculoskeletal:  Negative for arthralgias and myalgias.   Skin:  Negative for color change.   Neurological:  Negative for light-headedness and headaches.   Hematological:  Negative for adenopathy.   Psychiatric/Behavioral:  The patient is not nervous/anxious.    All other systems reviewed and are negative.      Objective:          acetaminophen (TYLENOL EXTRA STRENGTH) 500 mg tablet Take one tablet by mouth as Needed for Pain. Max of 4,000 mg of acetaminophen in 24 hours.    cholecalciferol (vitamin D3) (OPTIMAL D3) 50,000 units capsule TAKE ONE CAPSULE BY MOUTH EVERY WEEK    clobetasoL (TEMOVATE) 0.05 % topical ointment apply topically TO affected AREA TWICE DAILY FOR 2-4 WEEKS THEN taper TO THREE TIMES A WEEK AS NEEDED    estradioL (ESTRACE) 0.01 % (0.1 mg/g) vaginal cream INSERT 1 GRAM VAGINALLY TWICE A WEEK AS DIRECTED    folic acid/multivit-min/lutein (CENTRUM SILVER PO) Take  by mouth.    furosemide (LASIX) 20 mg tablet Take one tablet by mouth every morning. Take daily for 3 days and then on an as needed basis (>3lb weight gain and worsening edema).    ibuprofen (ADVIL) 200 mg tablet Take one tablet by mouth as Needed for Pain. Take with food.    losartan (COZAAR) 50 mg tablet Take one tablet by mouth daily.    omeprazole DR (PRILOSEC) 20 mg capsule TAKE ONE CAPSULE BY MOUTH EVERY DAY before breakfast    phenazopyridine (PYRIDIUM) 200 mg tablet Take one tablet by mouth three times daily as needed for Pain. Take after meals for up to 2 days.    psyllium husk (with sugar) (METAMUCIL (WITH SUGAR)) 3.4 gram packet Take one packet by mouth twice daily.    REPATHA SYRINGE 140 mg/mL injectable SYRINGE inject 1ml under the skin every 14 days    spironolactone (ALDACTONE) 25 mg tablet Take one tablet by mouth every 48 hours. Take with food.     Vitals:    11/09/22 1039   BP: 131/74   BP Source: Arm, Left Upper   Pulse: 76   Temp: 36.5 ?C (97.7 ?F)   Resp: 18   SpO2: 100%   TempSrc: Temporal   PainSc: Zero   Weight: 96 kg (211 lb 9.6 oz)     Body mass index is 32.17 kg/m?Marland Kitchen     Pain Score: Zero        Fatigue Scale: 0-None    KARNOFSKY PERFORMANCE SCORE:  100% Normal, no complaints     Physical Exam  Vitals reviewed.   Constitutional:       General: She is awake. She is not in acute distress.     Appearance: Normal appearance. She is well-developed. She is not ill-appearing.   HENT:      Head: Normocephalic and atraumatic.      Right Ear: External ear normal.      Left Ear: External ear normal.   Eyes:      General: Lids are normal. Extraocular Movements: Extraocular movements intact.      Conjunctiva/sclera: Conjunctivae normal.   Cardiovascular:      Rate and Rhythm: Normal rate and regular rhythm.      Heart  sounds: Normal heart sounds.   Pulmonary:      Effort: Pulmonary effort is normal. No respiratory distress.      Breath sounds: Normal breath sounds.   Chest:      Comments: Moderate lymphedema of dependent aspect of right breast extending into lateral chest wall. Otherwise, no palpable abnormalities in either breast. No skin, nipple, or areolar changes bilaterally.      Musculoskeletal:      Cervical back: Neck supple.   Lymphadenopathy:      Cervical: No cervical adenopathy.      Upper Body:      Right upper body: No supraclavicular or axillary adenopathy.      Left upper body: No supraclavicular or axillary adenopathy.   Skin:     General: Skin is warm and dry.      Coloration: Skin is not pale.   Neurological:      General: No focal deficit present.      Mental Status: She is alert and oriented to person, place, and time. Mental status is at baseline.      Cranial Nerves: Cranial nerves 2-12 are intact. No cranial nerve deficit.      Sensory: Sensation is intact.      Motor: Motor function is intact.   Psychiatric:         Attention and Perception: Attention normal.         Mood and Affect: Mood and affect normal.         Speech: Speech normal.         Behavior: Behavior normal.         Cognition and Memory: Cognition and memory normal.            Laboratory:    Comprehensive Metabolic Profile    Lab Results   Component Value Date/Time    NA 137 08/04/2022 12:00 AM    K 4.1 08/04/2022 12:00 AM    CL 103 08/04/2022 12:00 AM    CO2 25.0 08/04/2022 12:00 AM    GAP 9 08/04/2022 12:00 AM    BUN 17.6 08/04/2022 12:00 AM    CR 0.81 08/04/2022 12:00 AM    GLU 93 08/04/2022 12:00 AM    Lab Results   Component Value Date/Time    CA 9.2 08/04/2022 12:00 AM    ALBUMIN 3.9 08/04/2022 12:00 AM    TOTPROT 6.5 08/04/2022 12:00 AM    ALKPHOS 47 08/04/2022 12:00 AM    AST 22 08/04/2022 12:00 AM    ALT 23 08/04/2022 12:00 AM    TOTBILI 0.52 08/04/2022 12:00 AM    GFR 79.5 08/04/2022 12:00 AM    GFRAA >60 06/22/2017 12:51 PM        CBC w diff    Lab Results   Component Value Date/Time    WBC 4.57 07/04/2022 12:00 AM    RBC 3.75 (L) 07/04/2022 12:00 AM    HGB 12.0 07/04/2022 12:00 AM    HCT 35.2 07/04/2022 12:00 AM    MCV 93.9 07/04/2022 12:00 AM    MCH 32.0 07/04/2022 12:00 AM    MCHC 34.1 07/04/2022 12:00 AM    RDW 11.8 07/04/2022 12:00 AM    PLTCT 151 (L) 07/04/2022 12:00 AM    MPV 9.1 (L) 07/04/2022 12:00 AM    Lab Results   Component Value Date/Time    NEUT 59 01/17/2018 10:56 AM    ANC 2.90 01/17/2018 10:56 AM    LYMA 28 01/17/2018 10:56 AM  ALC 1.40 01/17/2018 10:56 AM    MONA 11 01/17/2018 10:56 AM    AMC 0.60 01/17/2018 10:56 AM    EOSA 1 01/17/2018 10:56 AM    AEC 0.10 01/17/2018 10:56 AM    BASA 1 01/17/2018 10:56 AM    ABC 0.00 01/17/2018 10:56 AM          Imaging:   EXAM:   MAMMO SCREEN BILAT/TOMO 04/20/22  9:59 AM     INDICATION:   Screening     COMPARISON:   Compared to:   12/20/2016 MAMMO SCREEN BILAT/TOMO/CAD   04/13/2020 MAMMO SCREEN BILAT/TOMO/CAD   04/14/2021 MAMMO SCREEN BILAT/TOMO/CAD     BREAST COMPOSITION:   The breasts have scattered areas of fibroglandular density.          TECHNIQUE:   3-D (digital tomosynthesis) and synthetic 2-D images were obtained   bilaterally.     FINDINGS:   No suspicious abnormality is seen.     IMPRESSION:     ASSESSMENT:   Overall: 1 - Negative      RECOMMENDATION AND DUE DATE:   Routine Screening Mammogram in 1 Year - Bilateral  04/22/2023     Electronically signed and approved by: Nicholaus Corolla, MD 04/20/2022   10:13 AM        Path:  PATHOLOGY REPORT   Date Value Ref Range Status   02/01/2018   Final    [facility]  [email or web address]    Department of Pathology and Laboratory Medicine  [address] [place], [place] [address]  Surgical Pathology Office:  [phone]  Fax: [phone]  SURGICAL PATHOLOGY REPORT    NAME: Teresa Trevino, Teresa Trevino SURG PATH #: [id number] MR #: [id number] SPECIMEN  CLASS: SR BILLING #: [id number] ALT ID #:  LOCATION: [id number] DATE OF  PROCEDURE: 02/01/2018 AGE:  62 SEX: F DATE RECEIVED: 02/02/2018 DOB:  [birth year]  TIME RECEIVED:  07:20 PHYSICIAN: SIDORENKO,ELENA   MGA DATE OF  REPORT: 02/05/2018 COPY TO:  DATE OF PRINTING: 02/05/2018         ########################################################################  Final Diagnosis:    A. Gastric mucosa, gastric polyps, biopsy:  Fundic gland polyps.     B. Gastric mucosa, gastric biopsies r/o H. pylori, biopsy:  Mild chronic inactive gastritis.   No H. pylori-like organisms are identified on H and E sections.       Attestation:  By this signature, I attest that I have personally formulated the final  interpretation expressed in this report and that the above diagnosis is  based upon my examination of the slides and/or other material indicated in  this report.    +++Electronically Signed Out By Kellie Simmering, MD, PhD on 02/05/2018+++             zh/02/02/2018             ########################################################################  Material Received:  A: gastric polyps  B: gastric biopsies r/o h. pylori    History:  67 year old female with history of gastroesophageal reflux disease,  esophagitis presence not specified, gastric polyps, encounter for  colorectal cancer screening.  B. Rule out H. pylori.         Gross Description:  A. Received in formalin labeled gastric polyps is a 0.6 x 0.5 x 0.3 cm  aggregate of tan-brown soft tissue fragments. The specimen is entirely  submitted in cassette A1. (tn)    B. Received in formalin labeled gastric biopsies rule out H. pylori is a  0.7 x 0.6 x  0.5 cm aggregate of tan-brown soft tissue fragments. The  specimen is entirely submitted in cassette B1. (tn)    tan/02/02/2018              ]     Assessment and Plan:    Ms. Teresa Trevino is a 67 year-old female with triple negative right breast cancer, stage IB (T1B N0 M0), status post neoadjuvant carboplatin/docetaxel and lumpectomy 06/2017 with complete response. She completed adjuvant radiation to the right breast to a total of 5,005 cGy in 20 fractions between 09/07/2017 and 10/05/2017.     - Teresa Trevino was seen today for routine follow-up after completion of radiation therapy.  At this time, she is clinically stable with no clinical evidence of disease recurrence or progression.  She has no subacute toxicities from having undergone adjuvant radiation.  - breast lymphedema remains stable, not bothersome. Using compression intermittently to control symptoms.   - mammogram 04/2022, as above, with benign findings  - Our plan will be for the patient to return to our clinic in 1 year to alternate follow-up with survivorship clinic for continued follow-up.  She may contact us in the interim if there are any questions or concerns requiring earlier assessment.  -The patient will keep their appointments with their other managing providers including the breast cancer survivorship clinic.            Total time for today's visit was 30 minutes. Visit time spent on the following: preparing to see the patient, obtaining and/or reviewing separately obtained history/information, performing a physical examination and/or evaluation, counseling and educating the patient/family/caregiver, referring to and communication with other health care professionals, documenting clinical information in the electronic or other health record and care coordination.     Teresa Trevino AGNP-C  [facility] - Radiation Oncology  [address]  [place], [place] [address]    Collaborating Physician: Elby Showers, MD    Parts of this note were created using voice recognition software.  Please excuse any grammatical or typographical errors.

## 2022-11-09 ENCOUNTER — Ambulatory Visit: Admit: 2022-11-09 | Discharge: 2022-11-09 | Payer: MEDICARE

## 2022-11-09 ENCOUNTER — Encounter: Admit: 2022-11-09 | Discharge: 2022-11-09 | Payer: MEDICARE

## 2022-11-09 DIAGNOSIS — R1310 Dysphagia, unspecified: Secondary | ICD-10-CM

## 2022-11-09 DIAGNOSIS — K219 Gastro-esophageal reflux disease without esophagitis: Secondary | ICD-10-CM

## 2022-11-09 DIAGNOSIS — I890 Lymphedema, not elsewhere classified: Secondary | ICD-10-CM

## 2022-11-09 DIAGNOSIS — B999 Unspecified infectious disease: Secondary | ICD-10-CM

## 2022-11-09 DIAGNOSIS — K449 Diaphragmatic hernia without obstruction or gangrene: Secondary | ICD-10-CM

## 2022-11-09 DIAGNOSIS — E785 Hyperlipidemia, unspecified: Secondary | ICD-10-CM

## 2022-11-09 DIAGNOSIS — C50919 Malignant neoplasm of unspecified site of unspecified female breast: Secondary | ICD-10-CM

## 2022-11-09 DIAGNOSIS — N904 Leukoplakia of vulva: Secondary | ICD-10-CM

## 2022-11-09 DIAGNOSIS — I10 Essential (primary) hypertension: Secondary | ICD-10-CM

## 2022-11-09 DIAGNOSIS — D72819 Decreased white blood cell count, unspecified: Secondary | ICD-10-CM

## 2022-11-09 DIAGNOSIS — D696 Thrombocytopenia, unspecified: Secondary | ICD-10-CM

## 2022-11-09 DIAGNOSIS — Z0389 Encounter for observation for other suspected diseases and conditions ruled out: Secondary | ICD-10-CM

## 2022-11-09 DIAGNOSIS — L900 Lichen sclerosus et atrophicus: Secondary | ICD-10-CM

## 2022-11-09 DIAGNOSIS — Z09 Encounter for follow-up examination after completed treatment for conditions other than malignant neoplasm: Secondary | ICD-10-CM

## 2022-11-09 DIAGNOSIS — H9190 Unspecified hearing loss, unspecified ear: Secondary | ICD-10-CM

## 2022-11-09 DIAGNOSIS — C4490 Unspecified malignant neoplasm of skin, unspecified: Secondary | ICD-10-CM

## 2022-11-09 DIAGNOSIS — N390 Urinary tract infection, site not specified: Secondary | ICD-10-CM

## 2022-11-09 DIAGNOSIS — I2510 Atherosclerotic heart disease of native coronary artery without angina pectoris: Secondary | ICD-10-CM

## 2022-11-09 DIAGNOSIS — R32 Unspecified urinary incontinence: Secondary | ICD-10-CM

## 2022-12-13 ENCOUNTER — Encounter: Admit: 2022-12-13 | Discharge: 2022-12-13 | Payer: MEDICARE

## 2022-12-23 ENCOUNTER — Encounter: Admit: 2022-12-23 | Discharge: 2022-12-23 | Payer: MEDICARE

## 2022-12-28 ENCOUNTER — Encounter: Admit: 2022-12-28 | Discharge: 2022-12-28 | Payer: MEDICARE

## 2023-01-04 ENCOUNTER — Encounter: Admit: 2023-01-04 | Discharge: 2023-01-04 | Payer: MEDICARE

## 2023-02-01 ENCOUNTER — Encounter: Admit: 2023-02-01 | Discharge: 2023-02-01 | Payer: MEDICARE

## 2023-02-01 MED ORDER — REPATHA SYRINGE 140 MG/ML SC SYRG
SUBCUTANEOUS | 11 refills | 28.00000 days | Status: AC
Start: 2023-02-01 — End: ?

## 2023-03-08 ENCOUNTER — Encounter: Admit: 2023-03-08 | Discharge: 2023-03-08 | Payer: MEDICARE

## 2023-03-08 ENCOUNTER — Ambulatory Visit: Admit: 2023-03-08 | Discharge: 2023-03-09 | Payer: MEDICARE

## 2023-03-13 ENCOUNTER — Encounter: Admit: 2023-03-13 | Discharge: 2023-03-13 | Payer: MEDICARE

## 2023-03-13 ENCOUNTER — Ambulatory Visit: Admit: 2023-03-13 | Discharge: 2023-03-13 | Payer: MEDICARE

## 2023-03-14 ENCOUNTER — Encounter: Admit: 2023-03-14 | Discharge: 2023-03-14 | Payer: MEDICARE

## 2023-03-14 DIAGNOSIS — R195 Other fecal abnormalities: Secondary | ICD-10-CM

## 2023-03-14 NOTE — Progress Notes
Negative stool studies so far.

## 2023-03-16 ENCOUNTER — Encounter: Admit: 2023-03-16 | Discharge: 2023-03-16 | Payer: MEDICARE

## 2023-03-22 ENCOUNTER — Encounter: Admit: 2023-03-22 | Discharge: 2023-03-22 | Payer: MEDICARE

## 2023-03-22 DIAGNOSIS — I890 Lymphedema, not elsewhere classified: Secondary | ICD-10-CM

## 2023-03-28 ENCOUNTER — Encounter: Admit: 2023-03-28 | Discharge: 2023-03-28 | Payer: MEDICARE

## 2023-04-03 ENCOUNTER — Encounter: Admit: 2023-04-03 | Discharge: 2023-04-03 | Payer: MEDICARE

## 2023-04-12 ENCOUNTER — Ambulatory Visit: Admit: 2023-04-12 | Discharge: 2023-04-12 | Payer: MEDICARE

## 2023-04-12 ENCOUNTER — Encounter: Admit: 2023-04-12 | Discharge: 2023-04-12 | Payer: MEDICARE

## 2023-04-12 ENCOUNTER — Ambulatory Visit: Admit: 2023-04-12 | Discharge: 2023-04-13 | Payer: MEDICARE

## 2023-04-12 MED ORDER — LIDOCAINE (PF) 20 MG/ML (2 %) IJ SOLN
INTRAVENOUS | 0 refills | Status: DC
Start: 2023-04-12 — End: 2023-04-12

## 2023-04-12 MED ORDER — PROPOFOL 10 MG/ML IV EMUL 50 ML (INFUSION)(AM)(OR)
INTRAVENOUS | 0 refills | Status: DC
Start: 2023-04-12 — End: 2023-04-12

## 2023-04-12 NOTE — Anesthesia Post-Procedure Evaluation
 Post-Anesthesia Evaluation    Name: Teresa Trevino      MRN: [id number]     DOB: 31-Aug-1955     Age: 68 y.o.     Sex: female   __________________________________________________________________________     Procedure Information       Anesthesia Start Date/Time: 04/12/23 1121    Procedures:       COLONOSCOPY DIAGNOSTIC WITH SPECIMEN COLLECTION BY BRUSHING/ WASHING - FLEXIBLE - random colon biopsies to rule out microscopic colitis      COLONOSCOPY WITH BIOPSY - FLEXIBLE    Location: [facility]/END[facility] 2 / [facility]    Surgeons: Eliott Nine, MD            Post-Anesthesia Vitals  BP: 119/90 (02/26 1205)  Temp: 36.5 ?C (97.7 ?F) (02/26 1059)  Pulse: 85 (02/26 1205)  Respirations: 17 PER MINUTE (02/26 1205)  SpO2: 97 % (02/26 1205)  O2 Device: None (Room air) (02/26 1205)  Height: 172.7 cm (5' 8) (02/26 1059)   Vitals Value Taken Time   BP 119/90 04/12/23 1205   Temp     Pulse 85 04/12/23 1205   Respirations 17 PER MINUTE 04/12/23 1205   SpO2 97 % 04/12/23 1205   O2 Device None (Room air) 04/12/23 1205   ABP     ART BP           Post Anesthesia Evaluation Note    Evaluation location: pre/post  Patient participation: recovered; patient participated in evaluation  Level of consciousness: alert    Pain score: 0  Pain management: adequate    Hydration: normovolemia  Temperature: 36.0?C - 38.4?C  Airway patency: adequate    Perioperative Events       Post-op nausea and vomiting: no PONV    Postoperative Status  Cardiovascular status: hemodynamically stable  Respiratory status: spontaneous ventilation  Additional comments: Pt awake and alert, VSS.  Denies complaints.  Ready for discharge at 1210        Perioperative Events  There were no known complications for this encounter.

## 2023-04-12 NOTE — Anesthesia Pre-Procedure Evaluation
 Anesthesia Pre-Procedure Evaluation    Name: Teresa Trevino      MRN: [id number]     DOB: 1955/09/30     Age: 68 y.o.     Sex: female   __________________________________________________________________________     Procedure Date: 04/12/2023   Procedure: Procedure(s) with comments:  COLONOSCOPY DIAGNOSTIC WITH SPECIMEN COLLECTION BY BRUSHING/ WASHING - FLEXIBLE - random colon biopsies to rule out microscopic colitis     Physical Assessment  Vital Signs (last filed in past 24 hours):  BP: 159/99 (02/26 1059)  Temp: 36.5 ?C (97.7 ?F) (02/26 1059)  Pulse: 94 (02/26 1059)  Respirations: 19 PER MINUTE (02/26 1059)  SpO2: 97 % (02/26 1059)  O2 Device: None (Room air) (02/26 1059)  Height: 172.7 cm (5' 8) (02/26 1059)  Weight: 91.9 kg (202 lb 9.6 oz) (02/26 1059)      Patient History  Allergies   Allergen Reactions    Bactrim [Sulfamethoxazole-Trimethoprim] NAUSEA AND VOMITING and SEE COMMENTS     Makes me feel ill    Erythromycin NAUSEA AND VOMITING    Macrobid [Nitrofurantoin Monohyd/M-Cryst] NAUSEA AND VOMITING    Sulfa (Sulfonamide Antibiotics) NAUSEA AND VOMITING        Current Medications    Medication Directions   acetaminophen (TYLENOL EXTRA STRENGTH) 500 mg tablet Take one tablet by mouth as Needed for Pain. Max of 4,000 mg of acetaminophen in 24 hours.   albuterol sulfate (PROAIR HFA) 90 mcg/actuation HFA aerosol inhaler Inhale two puffs by mouth into the lungs every 12 hours as needed.   cholecalciferol (vitamin D3) (OPTIMAL D3) 50,000 units capsule TAKE ONE CAPSULE BY MOUTH EVERY WEEK   clobetasoL (TEMOVATE) 0.05 % topical ointment apply topically TO affected AREA TWICE DAILY FOR 2-4 WEEKS THEN taper TO THREE TIMES A WEEK AS NEEDED   estradioL (ESTRACE) 0.01 % (0.1 mg/g) vaginal cream INSERT 1 GRAM VAGINALLY TWICE A WEEK AS DIRECTED   furosemide (LASIX) 20 mg tablet Take one tablet by mouth every morning. Take daily for 3 days and then on an as needed basis (>3lb weight gain and worsening edema).  Patient taking differently: Take one tablet by mouth as Needed. Take daily for 3 days and then on an as needed basis (>3lb weight gain and worsening edema).   ibuprofen (ADVIL) 200 mg tablet Take one tablet by mouth as Needed for Pain. Take with food.   losartan (COZAAR) 50 mg tablet Take one tablet by mouth daily.   omeprazole DR (PRILOSEC) 20 mg capsule TAKE ONE CAPSULE BY MOUTH EVERY DAY before breakfast   peg 3350-electrolytes (GOLYTELY) 236-22.74-6.74 -5.86 gram oral solution Take as directed by GI office for colonoscopy prep.   phenazopyridine (PYRIDIUM) 200 mg tablet Take one tablet by mouth three times daily as needed for Pain. Take after meals for up to 2 days.   psyllium husk (with sugar) (METAMUCIL (WITH SUGAR)) 3.4 gram packet Take one packet by mouth as Needed.   REPATHA SYRINGE 140 mg/mL injectable SYRINGE inject 1ml under the skin every 14 days     Social History     Socioeconomic History    Marital status: Married   Tobacco Use    Smoking status: Never    Smokeless tobacco: Never   Substance and Sexual Activity    Alcohol use: Yes     Alcohol/week: 2.0 standard drinks of alcohol     Types: 2 Shots of liquor per week     Comment: 2 drinks per week    Drug  use: No    Sexual activity: Not Currently     Partners: Male     Past Medical History:    Breast cancer (HCC)    Cancer of skin    Chronic leukopenia    Coronary artery disease    Difficulty swallowing    Dyslipidemia    GERD (gastroesophageal reflux disease)    Hearing loss    Hiatal hernia    Hyperlipemia    Hypertension    Incontinence    Infection    Lichen sclerosus    Lichen sclerosus et atrophicus of the vulva    Observation for suspected cardiovascular disease    Skin cancer    Thrombocytopenia ()    Urinary tract infection         Review of Systems/Medical History      Patient summary reviewed  Nursing notes reviewed  Pertinent labs reviewed    PONV Screening: Non-smoker and Female sex    No history of anesthetic complications    No family history of anesthetic complications      Airway - negative        No TMJ      Pulmonary - negative      Not a current smoker        No indications/hx of asthma      no COPD         No recent URI      Cardiovascular       Recent diagnostic studies:          ECG          EKG reviewed from 01/30/2017- [facility]       Exercise tolerance: >4 METS (Pt walks 1 mile without CP or dyspnea. )      Beta Blocker therapy: No      Beta blockers within 24 hours: n/a      Hypertension, well controlled          Coronary artery disease ( minimal coronary disease with a mildly abnormal CT coronary angiogram in August 2017 )            No dysrhythmias    No angina      No indications/hx of CHF      Hyperlipidemia (not currently on statin, pt stopped 2/2 diarrhea )      No syncope      Pt follows with Cardiology, last OV 04/2017      GI/Hepatic/Renal         Hiatal hernia        GERD (PPI), well controlled        No liver disease:         No renal disease:         Bowel prep        Screening colonoscopy  Hx of gastric polyps      Neuro/Psych       No seizures      No hx TIA        No CVA      No indications/hx of neuropathy        Psychiatric history          Anxiety      Musculoskeletal         No neck pain      No back pain      No arthritis:         Endocrine/Other       No diabetes  Anemia ( )        Autoimmune disease (h/o ITP)      Malignancy (Right breast cancer s/p chemo ):      Obesity: Class 1 (BMI 30-34.9)             Constitution - negative       Physical Exam    Airway Findings      Mallampati: I      TM distance: >3 FB      Neck ROM: full      Mouth opening: good      Airway patency: adequate    Dental Findings: Negative      Cardiovascular Findings:       Rhythm: regular      Rate: normal      No murmur, no carotid bruit, no peripheral edema    Pulmonary Findings:       Breath sounds clear to auscultation.    Abdominal Findings:       Not obese    Neurological Findings:       Alert and oriented x 3    Normal mental status    Constitutional findings:       No acute distress       Diagnostic Tests  Hematology:   Lab Results   Component Value Date    HGB 12.0 07/04/2022    HCT 35.2 07/04/2022    PLTCT 151 07/04/2022    WBC 4.57 07/04/2022    NEUT 59 01/17/2018    ANC 2.90 01/17/2018    LYMPH 24 07/20/2017    ALC 1.40 01/17/2018    MONA 11 01/17/2018    AMC 0.60 01/17/2018    EOSA 1 01/17/2018    ABC 0.00 01/17/2018    BASOPHILS 2 07/20/2017    MCV 93.9 07/04/2022    MCH 32.0 07/04/2022    MCHC 34.1 07/04/2022    MPV 9.1 07/04/2022    RDW 11.8 07/04/2022         General Chemistry:   Lab Results   Component Value Date    NA 137 08/04/2022    K 4.1 08/04/2022    CL 103 08/04/2022    CO2 25.0 08/04/2022    GAP 9 08/04/2022    BUN 17.6 08/04/2022    CR 0.81 08/04/2022    GLU 93 08/04/2022    CA 9.2 08/04/2022    ALBUMIN 3.9 08/04/2022    MG 1.6 08/04/2022    TOTBILI 0.52 08/04/2022      Coagulation: No results found for: PT, PTT, INR      Anesthesia Plan    ASA score: 3   Plan: MAC  NPO status: acceptable      Informed Consent  Anesthetic plan and risks discussed with patient.        Plan discussed with: anesthesiologist and CRNA.         Coronary CTA 09/25/2015-  1.  Mild to moderate but hemodynamically insignificant flat eccentric   calcific plaque throughout the proximal LAD. The proximal LAD fractional   flow reserve is 0.96. There is a short intracameral segment of the distal   LAD, a normal variant. By [facility] analysis there is no apparent   hemodynamically significant stenosis throughout the LAD and its major   diagonal branches. In the distal portions of the LAD and diagonal the   estimated FFR falls within the normal range at 0.85-0.86.  2.  The left circumflex is large but technically nondominant with  no   significant hemodynamic obstruction. The small distal AV groove branch has   a borderline low normal FFR equal to 0.80.  3.  Technically right coronary artery has no significant hemodynamic   lesions no significant soft plaque. There is slight calcification of the   proximal portion of the right coronary artery but the distal FFR in the   PDA branch is in the normal range at 0.90.  4.  The visualized thoracic aorta is unremarkable.  5.  The pericardium is unremarkable there is no pericardial effusion.  6.  Left ventricular ejection fraction 56% with no regional wall motion   abnormality.  PAC Plan  Alerts

## 2023-04-13 ENCOUNTER — Encounter: Admit: 2023-04-13 | Discharge: 2023-04-13 | Payer: MEDICARE

## 2023-04-16 ENCOUNTER — Encounter: Admit: 2023-04-16 | Discharge: 2023-04-16 | Payer: MEDICARE

## 2023-05-04 ENCOUNTER — Encounter: Admit: 2023-05-04 | Discharge: 2023-05-04 | Payer: MEDICARE

## 2023-05-04 ENCOUNTER — Ambulatory Visit: Admit: 2023-05-04 | Discharge: 2023-05-04 | Payer: MEDICARE

## 2023-05-04 DIAGNOSIS — Z853 Personal history of malignant neoplasm of breast: Secondary | ICD-10-CM

## 2023-05-04 DIAGNOSIS — Z9189 Other specified personal risk factors, not elsewhere classified: Secondary | ICD-10-CM

## 2023-05-04 DIAGNOSIS — C50311 Malignant neoplasm of lower-inner quadrant of right female breast: Secondary | ICD-10-CM

## 2023-05-04 DIAGNOSIS — Z1231 Encounter for screening mammogram for malignant neoplasm of breast: Secondary | ICD-10-CM

## 2023-05-09 ENCOUNTER — Encounter: Admit: 2023-05-09 | Discharge: 2023-05-09 | Payer: MEDICARE

## 2023-06-22 ENCOUNTER — Encounter: Admit: 2023-06-22 | Discharge: 2023-06-22 | Payer: MEDICARE

## 2023-06-22 ENCOUNTER — Ambulatory Visit: Admit: 2023-06-22 | Discharge: 2023-06-23 | Payer: MEDICARE

## 2023-07-03 ENCOUNTER — Ambulatory Visit: Admit: 2023-07-03 | Discharge: 2023-07-04 | Payer: MEDICARE

## 2023-07-03 ENCOUNTER — Encounter: Admit: 2023-07-03 | Discharge: 2023-07-03 | Payer: MEDICARE

## 2023-07-03 DIAGNOSIS — I890 Lymphedema, not elsewhere classified: Secondary | ICD-10-CM

## 2023-07-03 NOTE — Patient Instructions
 Follow-Up:    -Thank you for allowing us  to participate in your care today. Your After Visit Summary is being completed by Lorilee Rooks, RN.    -We would like you to follow up in  1 years with Guadelupe Leech, APRN  -The schedule is released approximately 4-5 months in advance. You will be called by our scheduling department to make an appointment and you will also receive a notification via MyChart to self-schedule.  However, if you would like to call to make this appointment, please call [phone].        Changes From Today's Office Visit     External order for physical therapy      Contacting our office:    -Business Hours: Monday-Friday, 8:00 am-4:30 pm (excluding Holidays).     -For medical questions or concerns, please send us  a message through your MyChart account or call the [facility] team nursing triage line at [phone]. Please leave a detailed message with your name, date of birth, and reason for your call.  If your message is received before 3:30pm, every effort will be made to call you back the same day.  Please allow time for us  to review your chart prior to call back.     -For medication refills please start by contacting your pharmacy. You can also send us  a prescription question through your MyChart or call the nurse triage line above.     -Please allow a minimum of 10-14 business days for clearance from your cardiologist for procedures and/or FMLA, disability, or  DOT forms. An office visit or testing may be required for us  to provide clearance.     Jeanine Millers nursing team fax number: [phone]    -You may receive a survey in the upcoming weeks from [facility]. Your feedback is important to us  and helps us  continue to improve patient care and patient satisfaction.     -Please feel free to call our Financial Department at [phone] with any questions or concerns about estimated cost of testing or imaging ordered today. We are happy to provide CPT codes upon request.    Results & Testing Follow Up:    -Please allow 5-7 business days for the results of any testing to be reviewed. Please call our office if you have not heard from a nurse within this time frame.    -Should you choose to complete testing at an outside facility, please contact our office after completion of testing so that we can ensure that we have received results for your provider to review.    Lab and test results:  As a part of the CARES act, starting 05/16/2019, some results will be released to you via MyChart immediately and automatically.  You may see results before your provider sees them; however, your provider will review all these results and then they, or one of their team, will notify you of result information and recommendations.   Critical results will be addressed immediately, but otherwise, please allow us  time to get back with you prior to you reaching out to us  for questions.  This will usually take about 72 hours for labs and 5-7 days for procedure test results.

## 2023-07-03 NOTE — Progress Notes
 Date of Service: 07/03/2023    Teresa Trevino is a 68 y.o. female.       HPI     Teresa Trevino is here today for follow up of venous insufficiency.     She was experiencing symptoms of bilateral lower extremity swelling and discomfort for the last several months. These symptoms worsen at the end of the day and improve with elevation and in the morning. She has a history of cellulitis in the left leg one earlier this year and swelling is worse on that side.  She also has a history of malignancy of the left lower extremity status post several rounds of radiation.  The initial severe swelling and discoloration improved with antibiotic therapy. She has also tried diuretic therapy without a significant change in symptoms. She has a known history of venous insufficiency however symptoms have been more bothersome recently.     We discussed initiated compression therapy and over the last several months she has been very diligent with compression stockings and use of compression pump.  She was also seen by Dr. Milburn Aliment for evaluation of venous insufficiency.  Overall she feels like she is doing fairly well and has noticed a fairly significant improvement of the left foot swelling.          Vitals:    07/03/23 0957   BP: (!) 143/78   BP Source: Arm, Left Upper   Pulse: 76   Temp: 36.4 ?C (97.5 ?F)   TempSrc: Temporal   PainSc: Zero   Weight: 92.7 kg (204 lb 6.4 oz)   Height: 172.7 cm (5' 8)       Body mass index is 31.08 kg/m?Aaron Aas     Past Medical History  Patient Active Problem List    Diagnosis Date Noted    Pain in unspecified lower leg 03/22/2023    Peripheral vascular disease 03/22/2023    Lymphedema due to venous insufficiency 12/12/2022     08/31/2022-PV Venous Insufficiency Bilateral Lower Extremity-[place]-Conclusions: There is venous insufficiency in the right saphenofemoral junction and the right mid to distal calf portions of the right great saphenous vein.There is venous insufficiency in the left saphenofemoral junction and the left mid thigh and mid to distal calf portions of the left great saphenous vein There is venous insufficiency in the right proximal and mid small saphenous vein  Bilateral small varicose venous tributaries with venous insufficiency in the calf No thrombus or venous insufficiency in the remainder of the visualized deep and superficial veins of bilateral lower extremities. 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  5.70min o9n TM, no CP. EF 59%, no ischemia  03/17  Ex Echo at [facility] - no ischemia, 6 min on TM, TDS,  no CP, no ECG changes, EF normal  06/17  Coronary Artery AJ-130 Score: Total 234.  LAD 206, RCA 28  08/17 CT Cor angiogram at [place] EF 56%, chambers and valves normal, LAD-moderate concentric flat plaque, FFR 0.86; LCx mild plaque, RCA normal.  Thoracic aorta normal.      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 Johndrow is a caucasian female who presented to the [facility] on 01/25/2017 at age 51 for evaluation of right breast cancer. Ms. Kumari had no complaints prior to her screening mammogram. A new right breast asymmetry was present on screening mammogram. There are was suspicious on diagnostic imaging and biopsy was recommended. Right breast sono-guided biopsy 01/19/17 ([place]) revealed grade 3 invasive ductal carcinoma. Ms. Sudol underwent right RSL lumpectomy/SLNB on 07/06/17. She finished radiation with Dr. Lonzell Robin on 10/05/17.    PATHOLOGY:  Tumor:  Size/Extent of Tumor Bed: 1.2 x 0.6 cm   Size/Extent of Residual Invasive Tumor: No invasive tumor identified   Overall Residual Cellularity of the Tumor Bed: <1%   1.5 mm DCIS present  Margins Free From Tumor:  Yes  ER:  negative   PR:  negative    Her 2:  negative  Grade:  3  Lymph Nodes:  0/4  LVSI:  no  Extranodal extension:  no      BREAST IMAGING:  Mammogram:    -- Bilateral screening mammogram 12/20/16 ([place]) revealed scattered fibroglandular densities. There was a focal asymmetry present within the central posterior right breast. Additional diagnostic images and ultrasound were recommended. No abnormalities were present on the left breast.  -- Right diagnostic mammogram 01/04/17 ([place]) revealed an approximately 1.2 cm x 0.6 cm x 0.5 cm low-density mass at 4:00 with associated calcifications. This was considered suspicious and further evaluation with ultrasound will be performed.    Ultrasound:    -- Targeted right breast ultrasound 01/04/17 ([place]) revealed at 4:00 there was an irregular hypoechoic mass measuring approximately 6 mm x 9 mm x 8 mm with associated microcalcifications. This was thought to likely correlate with the mammographic abnormality and was considered somewhat suspicious. Ultrasound-guided biopsy was recommended. If the clip marker form sono guided biopsy of this mass did not correlate to the mammographic finding, additional stereotactic biopsy might be required. These findings recommendations were discussed in detail with the patient at the time of today's procedure.     REPRODUCTIVE HEALTH:  Age at first Menarche: Unknown   Age at Menopause:  Hysterectomy/BSO at 62, HRT cream for several months    PROCEDURE: Right RSL lumpectomy/SLNB, 07/06/17  PERTINENT PMH:  HTN, chronic leukopenia  FAMILY HISTORY:  Maternal Grandmother- Breast cancer dx age late 60s  PHYSICAL EXAM on PRESENTATION: Right - No palpable breast masses. No skin, nipple, or areolar change. Left - No palpable breast masses. No skin, nipple, or areolar change. No supraclavicular or axillary adenopathy.   MEDICAL ONCOLOGY:  Dr. Bernabe Brew NEOADJUVANT THERAPY: Taxotere/carbo completed 06/01/17  REFERRED BY:  Melissa Huntington PA        Nocturia 01/2017    Post-void dribbling 01/2017    Lichen sclerosus et atrophicus of the vulva 09/22/2015    Agatston coronary artery calcium score between 200 and 399 08/21/2015    Hypercholesterolemia 08/20/2015    Vulvar lesion 03/12/2015  Recurrent UTI 09/11/2014    Dyspareunia, female 05/13/2013    Vaginal atrophy 05/13/2013    Urge incontinence of urine 05/13/2013    Mixed incontinence urge and stress (female)(female) 10/19/2011         ROS    Physical Exam   Musculoskeletal:         General: No tenderness.      Right lower leg: Edema present.      Left lower leg: Edema present.      Comments: Bilateral pitting edema (L>R)  Stemmer sign +   Skin: Skin is warm and dry. No rash noted. No erythema. No pallor.         Cardiovascular Studies      Cardiovascular Health Factors  Vitals BP Readings from Last 3 Encounters:   07/03/23 (!) 143/78   06/22/23 136/76   05/04/23 98/83     Wt Readings from Last 3 Encounters:   07/03/23 92.7 kg (204 lb 6.4 oz)   06/22/23 90.7 kg (200 lb)   05/04/23 90.7 kg (200 lb)     BMI Readings from Last 3 Encounters:   07/03/23 31.08 kg/m?   06/22/23 30.41 kg/m?   05/04/23 30.41 kg/m?      Smoking Social History     Tobacco Use   Smoking Status Never   Smokeless Tobacco Never      Lipid Profile Cholesterol   Date Value Ref Range Status   07/18/2022 116  Final     HDL   Date Value Ref Range Status   07/18/2022 43  Final     LDL   Date Value Ref Range Status   07/18/2022 56  Final     Triglycerides   Date Value Ref Range Status   07/18/2022 89  Final      Blood Sugar No results found for: HGBA1C  Glucose   Date Value Ref Range Status   08/04/2022 93  Final   07/04/2022 100  Final   07/03/2022 100  Final          Problems Addressed Today  Encounter Diagnoses   Name Primary?    Lymphedema        Assessment and Plan     Ms.Cornelio is here today for follow up of venous insufficiency. She is experiencing symptoms of bilateral lower extremity edema and discomfort. Venous insufficiency ultrasound demonstrates venous reflux in bilateral great saphenous veins, no evidence of superficial or deep vein thrombosis. She was seen by Dr.Hajj and conservative management recommended.     Her current compression regimen includes use of compression stockings and nightly compression pump.  Briefly discussed referral to decongestive therapy however her symptoms are fairly well-controlled with this current regimen and she would like to continue at this point.  Currently will we will not make any changes to her regimen at this time.  Advised to call us  back if she does notice any changes particularly development of varicose veins or swelling that is not well-controlled with current regimen.    Follow-up annually with Guadelupe Leech, APRN         Current Medications (including today's revisions)   acetaminophen (TYLENOL EXTRA STRENGTH) 500 mg tablet Take one tablet by mouth as Needed for Pain. Max of 4,000 mg of acetaminophen in 24 hours.    albuterol sulfate (PROAIR HFA) 90 mcg/actuation HFA aerosol inhaler Inhale two puffs by mouth into the lungs every 12 hours as needed.    cholecalciferol (vitamin D3) (OPTIMAL D3) 50,000 units capsule  TAKE ONE CAPSULE BY MOUTH EVERY WEEK    cholecalciferol (vitD3)/vit K2 (VITAMIN D3-VITAMIN K2 PO) Take 2 drops by mouth twice weekly.    clobetasoL (TEMOVATE) 0.05 % topical ointment apply topically TO affected AREA TWICE DAILY FOR 2-4 WEEKS THEN taper TO THREE TIMES A WEEK AS NEEDED    estradioL (ESTRACE) 0.01 % (0.1 mg/g) vaginal cream INSERT 1 GRAM VAGINALLY TWICE A WEEK AS DIRECTED    ibuprofen (ADVIL) 200 mg tablet Take one tablet by mouth as Needed for Pain. Take with food.    losartan (COZAAR) 50 mg tablet Take one tablet by mouth daily.    omeprazole DR (PRILOSEC) 20 mg capsule TAKE ONE CAPSULE BY MOUTH EVERY DAY before breakfast    phenazopyridine (PYRIDIUM) 200 mg tablet Take one tablet by mouth three times daily as needed for Pain. Take after meals for up to 2 days.    psyllium husk (with sugar) (METAMUCIL (WITH SUGAR)) 3.4 gram packet Take one packet by mouth as Needed.    REPATHA SYRINGE 140 mg/mL injectable SYRINGE inject 1ml under the skin every 14 days

## 2023-07-30 ENCOUNTER — Encounter: Admit: 2023-07-30 | Discharge: 2023-07-30 | Payer: MEDICARE

## 2023-08-22 ENCOUNTER — Encounter: Admit: 2023-08-22 | Discharge: 2023-08-22 | Payer: MEDICARE

## 2023-08-23 ENCOUNTER — Encounter: Admit: 2023-08-23 | Discharge: 2023-08-23 | Payer: MEDICARE

## 2023-09-04 ENCOUNTER — Encounter: Admit: 2023-09-04 | Discharge: 2023-09-04 | Payer: MEDICARE

## 2023-09-04 ENCOUNTER — Ambulatory Visit: Admit: 2023-09-04 | Discharge: 2023-09-05 | Payer: MEDICARE

## 2023-09-27 ENCOUNTER — Encounter: Admit: 2023-09-27 | Discharge: 2023-09-27 | Payer: MEDICARE

## 2023-11-08 ENCOUNTER — Encounter: Admit: 2023-11-08 | Discharge: 2023-11-08 | Payer: MEDICARE

## 2023-11-09 ENCOUNTER — Encounter: Admit: 2023-11-09 | Discharge: 2023-11-09 | Payer: MEDICARE

## 2023-11-09 ENCOUNTER — Ambulatory Visit: Admit: 2023-11-09 | Discharge: 2023-11-10 | Payer: MEDICARE

## 2023-11-10 ENCOUNTER — Encounter: Admit: 2023-11-10 | Discharge: 2023-11-10 | Payer: MEDICARE

## 2023-11-10 DIAGNOSIS — R195 Other fecal abnormalities: Principal | ICD-10-CM

## 2023-11-10 DIAGNOSIS — R140 Abdominal distension (gaseous): Secondary | ICD-10-CM

## 2023-11-10 DIAGNOSIS — R151 Fecal smearing: Secondary | ICD-10-CM

## 2023-11-14 NOTE — Progress Notes
 Radiation Oncology Follow Up Note  Date: 11/15/2023       Teresa Trevino is a 68 y.o. female.     The primary encounter diagnosis was Malignant neoplasm of lower-inner quadrant of right breast of female, estrogen receptor negative (CMS-HCC). A diagnosis of Radiotherapy follow-up was also pertinent to this visit.  Staging:  Cancer Staging   Malignant neoplasm of lower-inner quadrant of right breast of female, estrogen receptor negative (CMS-HCC)  Staging form: Breast, AJCC 8th Edition  - Clinical stage from 01/19/2017: Stage IB (cT1b, cN0, cM0, G3, ER-, PR-, HER2-) - Signed by Cleotilde Countryman, PA-C on 01/24/2017      History of Present Illness  Teresa Trevino is a 68 y.o. female with hx of triple negative right breast cancer, stage IB (T1B N0 M0), status post neoadjuvant carboplatin /docetaxel  and lumpectomy 06/2017 with complete response. She was recommended to receive adjuvant radiotherapy.      Diagnosis:   Stage IB ER-, PR-, HER2- right breast cancer s/p BCS     Treatment History:   Teresa Trevino underwent adjuvant radiotherapy to her right breast 09/07/2017 - 10/05/2017. She tolerated treatment well and had no treatment interruptions.       09/28/2017   Course ID C1 RT BREAST   First Treatment Date 09-07-2017 12:45PM   Last Treatment Date 09-28-2017 10:55AM   Treatment Elapsed Days 21   Reference Point ID RT BREAST   Dosage Given To Date 40.05   Session Dosage Given 2.67   Plan ID RT BREAST_FiF   Fractions Treated to Date 15   Total Fractions on Plan 15   Prescribed Dose per Fraction 2.67   Prescription Dose 4,005           10/05/2017   Course ID C1 RT BREAST   First Treatment Date 09-07-2017 12:45PM   Last Treatment Date 10-05-2017 11:50AM   Treatment Elapsed Days 28   Reference Point ID [id number]   Dosage Given To Date 10   Session Dosage Given 2   Plan ID BOOST ELEC   Fractions Treated to Date 5   Total Fractions on Plan 5   Prescribed Dose per Fraction 2   Prescription Dose 1,000      Total Dose: 5,005 cGy      Subjective:       Teresa Trevino returns today for routine follow-up. Teresa Trevino is unaccompanied to today's visit. Doing well. No breast discomfort. Ongoing firmness in lower breast. Lymphedema stable, not uncomfortable. No tightness of her chest wall or shoulder but does have some discomfort with some positions. Patient denies shortness of air, cough or chills. Patient denies neurologic symptoms such as headache, dizziness or vision changes. Energy levels are okay; she does remain active. Recently returned from a 12 day trip around the [place]. Prefers to continue annual follow-up.        Review of Systems   Constitutional:  Negative for fatigue and fever.   HENT:  Negative for sore throat and trouble swallowing.    Eyes:  Negative for visual disturbance.   Respiratory:  Negative for cough and shortness of breath.    Gastrointestinal:  Negative for abdominal pain, nausea and vomiting.   Musculoskeletal:  Negative for arthralgias and myalgias.   Skin:  Negative for color change.   Neurological:  Negative for light-headedness and headaches.   Hematological:  Negative for adenopathy.   Psychiatric/Behavioral:  The patient is not nervous/anxious.    All other systems reviewed and are negative.  Objective:          acetaminophen  (TYLENOL  EXTRA STRENGTH) 500 mg tablet Take one tablet by mouth as Needed for Pain. Max of 4,000 mg of acetaminophen  in 24 hours.    albuterol  sulfate (PROAIR  HFA) 90 mcg/actuation HFA aerosol inhaler Inhale two puffs by mouth into the lungs every 12 hours as needed.    cholecalciferol (vitamin D3) (OPTIMAL D3) 50,000 units capsule TAKE ONE CAPSULE BY MOUTH EVERY WEEK    cholecalciferol (vitD3)/vit K2 (VITAMIN D3-VITAMIN K2 PO) Take 2 drops by mouth twice weekly.    clobetasoL  (TEMOVATE ) 0.05 % topical ointment APPLY TO THE AFFECTED AREA(S) TWICE DAILY for 2-4 weeks then taper to three times a week AS NEEDED (Patient not taking: Reported on 11/15/2023)    estradioL  (ESTRACE ) 0.01 % (0.1 mg/g) vaginal cream Insert or Apply one g to vaginal area twice weekly.    ibuprofen (ADVIL) 200 mg tablet Take one tablet by mouth as Needed for Pain. Take with food.    losartan  (COZAAR ) 50 mg tablet Take one tablet by mouth daily.    omeprazole  DR (PRILOSEC) 20 mg capsule TAKE ONE CAPSULE BY MOUTH EVERY DAY before breakfast    phenazopyridine  (PYRIDIUM ) 200 mg tablet Take one tablet by mouth three times daily as needed for Pain. Take after meals for up to 2 days.    psyllium husk (with sugar) (METAMUCIL (WITH SUGAR)) 3.4 gram packet Take one packet by mouth as Needed. (Patient not taking: Reported on 11/15/2023)    REPATHA  SYRINGE 140 mg/mL injectable SYRINGE inject 1ml under the skin every 14 days     Vitals:    11/15/23 1101   BP: 121/73   BP Source: Arm, Left Upper   Pulse: 84   Temp: 36.8 ?C (98.3 ?F)   Resp: 20   SpO2: 97%   TempSrc: Temporal   PainSc: Zero   Weight: 93.4 kg (206 lb)     Body mass index is 31.32 kg/m?SABRA     Pain Score: Zero        Fatigue Scale: 0-None    KARNOFSKY PERFORMANCE SCORE:  100% Normal, no complaints     Physical Exam  Vitals reviewed.   Constitutional:       General: Teresa Trevino is awake. Teresa Trevino is not in acute distress.     Appearance: Normal appearance. Teresa Trevino is well-developed. Teresa Trevino is not ill-appearing.   HENT:      Head: Normocephalic and atraumatic.      Right Ear: External ear normal.      Left Ear: External ear normal.   Eyes:      General: Lids are normal.      Extraocular Movements: Extraocular movements intact.      Conjunctiva/sclera: Conjunctivae normal.   Cardiovascular:      Rate and Rhythm: Normal rate and regular rhythm.      Heart sounds: Normal heart sounds.   Pulmonary:      Effort: Pulmonary effort is normal. No respiratory distress.      Breath sounds: Normal breath sounds.   Chest:      Comments: Lymphedema dependent right breast, otherwise no palpable abnormalities or masses. No palpable masses, skin or nipple changes left breast.     Musculoskeletal:      Cervical back: Neck supple.   Lymphadenopathy:      Cervical: No cervical adenopathy.      Upper Body:      Right upper body: No supraclavicular or axillary adenopathy.  Left upper body: No supraclavicular or axillary adenopathy.   Skin:     General: Skin is warm and dry.      Coloration: Skin is not pale.   Neurological:      General: No focal deficit present.      Mental Status: Teresa Trevino is alert and oriented to person, place, and time. Mental status is at baseline.      Cranial Nerves: Cranial nerves 2-12 are intact. No cranial nerve deficit.      Sensory: Sensation is intact.      Motor: Motor function is intact.   Psychiatric:         Attention and Perception: Attention normal.         Mood and Affect: Mood and affect normal.         Speech: Speech normal.         Behavior: Behavior normal.         Cognition and Memory: Cognition and memory normal.            Laboratory:    Comprehensive Metabolic Profile    Lab Results   Component Value Date/Time    NA 137 08/04/2022 12:00 AM    K 4.1 08/04/2022 12:00 AM    CL 103 08/04/2022 12:00 AM    CO2 25.0 08/04/2022 12:00 AM    GAP 9 08/04/2022 12:00 AM    BUN 17.6 08/04/2022 12:00 AM    CR 0.81 08/04/2022 12:00 AM    GLU 93 08/04/2022 12:00 AM    Lab Results   Component Value Date/Time    CA 9.2 08/04/2022 12:00 AM    ALBUMIN 3.9 08/04/2022 12:00 AM    TOTPROT 6.5 08/04/2022 12:00 AM    ALKPHOS 47 08/04/2022 12:00 AM    AST 22 08/04/2022 12:00 AM    ALT 23 08/04/2022 12:00 AM    TOTBILI 0.52 08/04/2022 12:00 AM    GFR 79.5 08/04/2022 12:00 AM    GFRAA >60 06/22/2017 12:51 PM        CBC w diff    Lab Results   Component Value Date/Time    WBC 4.57 07/04/2022 12:00 AM    RBC 3.75 (L) 07/04/2022 12:00 AM    HGB 12.0 07/04/2022 12:00 AM    HCT 35.2 07/04/2022 12:00 AM    MCV 93.9 07/04/2022 12:00 AM    MCH 32.0 07/04/2022 12:00 AM    MCHC 34.1 07/04/2022 12:00 AM    RDW 11.8 07/04/2022 12:00 AM    PLTCT 151 (L) 07/04/2022 12:00 AM    MPV 9.1 (L) 07/04/2022 12:00 AM    Lab Results   Component Value Date/Time    NEUT 59 01/17/2018 10:56 AM    ANC 2.90 01/17/2018 10:56 AM    LYMA 28 01/17/2018 10:56 AM    ALC 1.40 01/17/2018 10:56 AM    MONA 11 01/17/2018 10:56 AM    AMC 0.60 01/17/2018 10:56 AM    EOSA 1 01/17/2018 10:56 AM    AEC 0.10 01/17/2018 10:56 AM    BASA 1 01/17/2018 10:56 AM    ABC 0.00 01/17/2018 10:56 AM          Imaging:   MAMMO SCREEN BILAT/TOMO  Narrative: [facility]  [place]  Imaging, Mammography: [facility]  [address] [place] [address]  [phone]    EXAM:  MAMMO SCREEN BILAT/TOMO 05/04/23 11:25 AM  INDICATION:   Screening    COMPARISON:  Compared to:   03/29/2019 MAMMO SCREEN BILAT/TOMO/CAD  04/13/2020 MAMMO SCREEN BILAT/TOMO/CAD  04/14/2021 MAMMO SCREEN BILAT/TOMO/CAD  04/20/2022 MAMMO SCREEN BILAT/TOMO     BREAST COMPOSITION:   There are scattered areas of fibroglandular density.         TECHNIQUE:  3-D (digital tomosynthesis) and synthetic 2-D images were obtained   bilaterally.    FINDINGS:  No suspicious abnormality is seen.  Impression: :    ASSESSMENT:  Overall: 1 - Negative     RECOMMENDATION AND DUE DATE:  Routine Screening Mammogram in 1 Year - Bilateral  05/05/2024    Electronically signed and approved by: Manuelita JONELLE Argyle, MD 05/04/2023 11:45   AM             Path:  PATHOLOGY REPORT   Date Value Ref Range Status   02/01/2018   Final    [facility]  [email or web address]    Department of Pathology and Laboratory Medicine  [address] [place]  [place], [place] [address]  Surgical Pathology Office:  [phone]  Fax:  [phone]  SURGICAL PATHOLOGY REPORT    NAME: JOYE, WESENBERG SURG PATH #: [id number] MR #: [id number] SPECIMEN  CLASS: SR BILLING #: [id number] ALT ID #:  LOCATION: [id number] DATE OF  PROCEDURE: 02/01/2018 AGE:  62 SEX: F DATE RECEIVED: 02/02/2018 DOB:  1955/10/12  TIME RECEIVED:  07:20 PHYSICIAN: SIDORENKO,ELENA   MGA DATE OF  REPORT: 02/05/2018 COPY TO:  DATE OF PRINTING: 02/05/2018         ########################################################################  Final Diagnosis:    A. Gastric mucosa, gastric polyps, biopsy:  Fundic gland polyps.     B. Gastric mucosa, gastric biopsies r/o H. pylori, biopsy:  Mild chronic inactive gastritis.   No H. pylori-like organisms are identified on H and E sections.       Attestation:  By this signature, I attest that I have personally formulated the final  interpretation expressed in this report and that the above diagnosis is  based upon my examination of the slides and/or other material indicated in  this report.    +++Electronically Signed Out By Laverle Finders, MD, PhD on 02/05/2018+++             zh/02/02/2018             ########################################################################  Material Received:  A: gastric polyps  B: gastric biopsies r/o h. pylori    History:  68 year old female with history of gastroesophageal reflux disease,  esophagitis presence not specified, gastric polyps, encounter for  colorectal cancer screening.  B. Rule out H. pylori.         Gross Description:  A. Received in formalin labeled gastric polyps is a 0.6 x 0.5 x 0.3 cm  aggregate of tan-brown soft tissue fragments. The specimen is entirely  submitted in cassette A1. (tn)    B. Received in formalin labeled gastric biopsies rule out H. pylori is a  0.7 x 0.6 x 0.5 cm aggregate of tan-brown soft tissue fragments. The  specimen is entirely submitted in cassette B1. (tn)    tan/02/02/2018              ]     Assessment and Plan:   Ms. Teresa Trevino is a 68 year-old female with triple negative right breast cancer, stage IB (T1B N0 M0), status post neoadjuvant carboplatin /docetaxel  and lumpectomy 06/2017 with complete response. She completed adjuvant radiation to the right breast to  a total of 5,005 cGy in 20 fractions between 09/07/2017 and 10/05/2017.     - Teresa Trevino was seen today for routine follow-up after completion of radiation therapy.  At this time, Teresa Trevino is clinically stable with no clinical evidence of disease recurrence or progression.  Teresa Trevino has no subacute toxicities from having undergone adjuvant radiation.  - mammogram 04/2023, as above, with benign findings  - Our plan will be for the patient to return to our clinic in 1 year for continued follow-up.  Malillany may contact us  in the interim if there are any questions or concerns requiring earlier assessment.  -The patient will keep their appointments with their other managing providers including the breast cancer survivorship clinic.            Total time for today's visit was 25 minutes. Visit time spent on the following: preparing to see the patient, obtaining and/or reviewing separately obtained history/information, performing a physical examination and/or evaluation, counseling and educating the patient/family/caregiver, referring to and communication with other health care professionals, documenting clinical information in the electronic or other health record and care coordination.     Elida Batter AGNP-C  [place]  [facility] - Radiation Oncology  [address]  [place], [place] [address]    Collaborating Physician: Ival Riches, MD    Parts of this note were created using voice recognition software.  Please excuse any grammatical or typographical errors.

## 2023-11-15 ENCOUNTER — Ambulatory Visit: Admit: 2023-11-15 | Discharge: 2023-11-15 | Payer: MEDICARE

## 2023-11-15 ENCOUNTER — Encounter: Admit: 2023-11-15 | Discharge: 2023-11-15 | Payer: MEDICARE

## 2023-11-15 VITALS — BP 121/73 | HR 84 | Temp 98.30000°F | Resp 20 | Wt 206.0 lb

## 2023-11-15 DIAGNOSIS — Z09 Encounter for follow-up examination after completed treatment for conditions other than malignant neoplasm: Secondary | ICD-10-CM

## 2023-11-15 DIAGNOSIS — C50311 Malignant neoplasm of lower-inner quadrant of right female breast: Principal | ICD-10-CM

## 2023-12-18 ENCOUNTER — Encounter: Admit: 2023-12-18 | Discharge: 2023-12-18 | Payer: MEDICARE

## 2023-12-18 DIAGNOSIS — R150 Incomplete defecation: Secondary | ICD-10-CM

## 2023-12-18 DIAGNOSIS — R195 Other fecal abnormalities: Principal | ICD-10-CM

## 2023-12-18 DIAGNOSIS — K219 Gastro-esophageal reflux disease without esophagitis: Secondary | ICD-10-CM

## 2023-12-18 DIAGNOSIS — R140 Abdominal distension (gaseous): Secondary | ICD-10-CM

## 2023-12-18 DIAGNOSIS — M6289 Other specified disorders of muscle: Secondary | ICD-10-CM

## 2023-12-18 DIAGNOSIS — N8181 Perineocele: Secondary | ICD-10-CM

## 2023-12-18 DIAGNOSIS — R151 Fecal smearing: Secondary | ICD-10-CM

## 2023-12-18 MED ORDER — LACTULOSE 10 GRAM/15 ML PO SOLN
10 g | Freq: Once | ORAL | 0 refills | 21.00000 days | Status: AC
Start: 2023-12-18 — End: ?

## 2023-12-18 NOTE — Telephone Encounter [36]
 [facility] order and medication prescription order with [employer] faxed to [facility]:  Fax: [phone]

## 2023-12-23 ENCOUNTER — Encounter: Admit: 2023-12-23 | Discharge: 2023-12-23 | Payer: MEDICARE

## 2024-01-02 ENCOUNTER — Encounter: Admit: 2024-01-02 | Discharge: 2024-01-02 | Payer: MEDICARE

## 2024-01-08 ENCOUNTER — Encounter: Admit: 2024-01-08 | Discharge: 2024-01-08 | Payer: MEDICARE

## 2024-01-08 VITALS — BP 135/92 | HR 79 | Ht 68.0 in | Wt 199.8 lb

## 2024-01-08 DIAGNOSIS — I872 Venous insufficiency (chronic) (peripheral): Secondary | ICD-10-CM

## 2024-01-08 DIAGNOSIS — I890 Lymphedema, not elsewhere classified: Secondary | ICD-10-CM

## 2024-01-08 DIAGNOSIS — Z789 Other specified health status: Secondary | ICD-10-CM

## 2024-01-08 DIAGNOSIS — E7800 Pure hypercholesterolemia, unspecified: Secondary | ICD-10-CM

## 2024-01-08 DIAGNOSIS — R931 Abnormal findings on diagnostic imaging of heart and coronary circulation: Secondary | ICD-10-CM

## 2024-01-08 DIAGNOSIS — Z136 Encounter for screening for cardiovascular disorders: Principal | ICD-10-CM

## 2024-01-08 NOTE — Patient Instructions [37]
 It was nice to see you in clinic today.    We discussed:  Your cholesterol levels which are under very good control with Repatha  which we want to continue.  Continue the good work with compression socks and pump therapy.  Continue to monitor your blood pressure periodically and let us  know if it increases over time.    Otherwise continue the same medications as you have been doing.    I will plan to see you back in about 12 months.  Please call us  in the meantime with any questions or concerns.    If there are any questions or concerns, our main line is [phone]. Our scheduling department can be reached at [phone]. To get a hold of the [employer], call the [place] nursing line at [phone].  Leave a detailed message for the nurses in [place] [facility] with how we can assist you and we will call you back.     If you have not heard or been advised of MyChart results of your testing in more than 1 week after it has been performed, please give us  a call so that we may investigate further.    Lifestyle Modification recommendations:  Please continue to take your prescribed medications at the scheduled times on a regular basis.    Regular aerobic and weight bearing exercise is recommended at least 5 days a week for at least 30 minutes with a goal to exercise for 60 minutes. Ideally, exercise should be done daily if possible.     Eat a heart healthy, mediterranean style diet, focusing on fresh fruit, vegetables, nuts, legumes and avoidance of red or processed meat.   Limit the salt in your diet to less than 2,400 mg daily and calories in your diet.   Avoid high saturated fat/high cholesterol foods.  Increase soluble fiber intake as tolerated.     Limit alcohol intake to 1 drink daily for women and 2 drinks daily for men (1 drink = 5 ounces of wine, 12 ounces of beer, or 1 ounce of liquor).    Try to maintain an ideal body weight.    Do not smoke and avoid environments where people are smoking.    The American Heart Association has a great website with lots of important patient information about heart disease and other medical conditions including: high blood pressure, cholesterol disorders, diabetes mellitus, and strokes.  I highly recommend you visit their site: www.heart.org    Follow up regularly with your primary care physician.

## 2024-01-08 NOTE — Progress Notes [1]
 Date of Service: 01/08/2024    Teresa Trevino is a 68 y.o. female.         History of present illness: Teresa Trevino is a very pleasant 68 y.o. female who is being seen at the [facility] [place]  Cardiovascular Medicine Department at the [facility]. [facility] office.      Pertinent past medical history includes coronary artery disease as manifested by calcium score of 236 in 2020, familial hypercholesterolemia, statin intolerance, right-sided breast cancer status post radiation and chemotherapy (no anthracyclines), recurrent a UTI, previous bladder injury with hysterectomy, mid urethral sling.  She is known to have venous insufficiency.  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  This was complicated by a significant cellulitis.    History of Present Illness  Teresa Trevino is a 68 year old with hyperlipidemia and venous insufficiency who presents for a routine cardiovascular follow-up.    They have been on Repatha  for hyperlipidemia, which has significantly improved their blood work. They are satisfied with the medication and note significant improvement in their numbers.    Venous insufficiency is managed with compression therapy and lymphedema pumps, used four to five times a week. They find it easier to wear compression socks during cooler months. The therapy is effective, although they sometimes struggle to use the pumps while traveling due to their size.    They have a history of breast cancer and have experienced neuropathy in their feet, attributed to chemotherapy.     Blood pressure is usually well-controlled with losartan , although it was slightly elevated today. Typically, they have good blood pressure readings during other office visits and at home.    They have a routine of visiting their primary care physician every six months for blood work, given their history of breast cancer, to monitor for any changes.      Assessment:  Coronary artery disease  calcium score of 236 in 2020  Familial hypercholesterolemia  Statin intolerance  Hypertension  Right-sided breast cancer status post radiation and chemotherapy without anthracyclines  Left lower extremity edema: Venous insufficiency and lymphedema compounded by previous radiation and significant cellulitis episode  Hypertension  Neuropathy  Frequent UTIs.     Plan:  Assessment & Plan    Chronic peripheral venous insufficiency with lymphedema  Condition managed with compression therapy and lymphedema pumps. No current need for surgical intervention as symptoms are well-managed with conservative measures. Compression therapy and gravity are primary management strategies. Ablation is a backup option if symptoms worsen.  - Continue compression therapy and lymphedema pumps.  - Monitor for changes in symptoms that may require further intervention.    Pure hypercholesterolemia on PCSK9 inhibitor therapy  Cholesterol levels well-controlled with Repatha .   - Continue Repatha  therapy.    Essential hypertension  Blood pressure slightly elevated today, likely due to situational factors. Generally well-controlled with losartan .  - Continue losartan  for blood pressure management.  - Monitor blood pressure periodically at home.    Chemotherapy-induced peripheral neuropathy  Neuropathy likely secondary to chemotherapy.     We discussed ongoing lifestyle modifications including >150 minutes of aerobic and weight bearing exercise weekly in addition to a preferentially plant based Mediterranean-style, sodium and calorie restricted diet.      Thank you for allowing us  to care for this patient. If there are any questions or concerns, please don't hesitate to contact us .     We will see the patient back in return in 1 year(s) time,  or sooner if needed.     Ozell Lins, DO, [facility]  Staff Cardiologist  Department of Cardiovascular Medicine  [facility]    Orders Placed This Encounter    ECG 12-LEAD       CURRENT MEDICATIONS:(including today's revisions)   acetaminophen  (TYLENOL  EXTRA STRENGTH) 500 mg tablet Take one tablet by mouth as Needed for Pain. Max of 4,000 mg of acetaminophen  in 24 hours.    albuterol  sulfate (PROAIR  HFA) 90 mcg/actuation HFA aerosol inhaler Inhale two puffs by mouth into the lungs every 12 hours as needed.    BENEFIBER (GUAR GUM) PO Take 2 Gummy by mouth daily.    cholecalciferol (vitamin D3) (OPTIMAL D3) 50,000 units capsule Take one capsule by mouth every 7 days.    cholecalciferol (vitD3)/vit K2 (VITAMIN D3-VITAMIN K2 PO) Take 2 drops by mouth twice weekly. (Patient not taking: Reported on 01/08/2024)    clobetasoL  (TEMOVATE ) 0.05 % topical ointment APPLY TO THE AFFECTED AREA(S) TWICE DAILY for 2-4 weeks then taper to three times a week AS NEEDED (Patient taking differently: three times weekly. APPLY TO THE AFFECTED AREA(S) TWICE DAILY for 2-4 weeks then taper to three times a week AS NEEDED)    estradioL  (ESTRACE ) 0.01 % (0.1 mg/g) vaginal cream Insert or Apply one g to vaginal area twice weekly.    ibuprofen (ADVIL) 200 mg tablet Take one tablet by mouth as Needed for Pain. Take with food.    losartan  (COZAAR ) 50 mg tablet Take one tablet by mouth daily.    moxifloxacin (VIGAMOX) 0.5 % ophthalmic solution Apply one drop to right eye as directed four times daily.    omeprazole  DR (PRILOSEC) 20 mg capsule TAKE ONE CAPSULE BY MOUTH EVERY DAY before breakfast    phenazopyridine  (PYRIDIUM ) 200 mg tablet Take one tablet by mouth three times daily as needed for Pain. Take after meals for up to 2 days. (Patient not taking: Reported on 01/08/2024)    psyllium husk (with sugar) (METAMUCIL (WITH SUGAR)) 3.4 gram packet Take one packet by mouth as Needed. (Patient not taking: Reported on 01/08/2024)    REPATHA  SYRINGE 140 mg/mL injectable SYRINGE inject 1ml under the skin every 14 days       ALLERGIES:  Allergies   Allergen Reactions    Bactrim [Sulfamethoxazole-Trimethoprim] NAUSEA AND VOMITING and SEE COMMENTS     Makes me feel ill    Erythromycin NAUSEA AND VOMITING    Macrobid [Nitrofurantoin Monohyd/M-Cryst] NAUSEA AND VOMITING    Sulfa (Sulfonamide Antibiotics) NAUSEA AND VOMITING       Latest cardiovascular testing includes:  Most recent results for 12-Lead ECG   ECG 12-LEAD    Collection Time: 01/08/24  8:13 AM   Result Value Status    VENTRICULAR RATE 76 Final    P-R INTERVAL 178 Final    QRS DURATION 84 Final    Q-T INTERVAL 354 Final    QTC CALCULATION (BAZETT) 398 Final    P AXIS 37 Final    R AXIS 32 Final    T AXIS 50 Final    Impression    Normal sinus rhythm  Low voltage QRS  Borderline ECG  When compared with ECG of 20-Jul-2022 11:03,  No significant change was found  Confirmed by Halleigh Comes ([id number]) on 01/08/2024 8:17:08 AM     *Note: Due to a large number of results and/or encounters for the requested time period, some results have not been displayed. A complete set of results can be  found in Results Review.     Last Stress test: 09/06/2021:  1. This study is normal. No definite fixed or reversible perfusion defects   are present on the static image data sets.   2. The resting left ventricular ejection fraction of 73% improved to 75%   during peak stress without regional wall motion abnormalities. The left   ventricular end-diastolic volume at rest is 88 mL.   3. Mild to moderate coronary artery calcification. The coronary calcium is   primarily located in the LAD.SABRA   4. Normal resting and peak stress myocardial blood flow both regionally   and globally. Myocardial blood flow reserve is normal both regionally and   globally. This implies a low risk for future adverse cardiovascular   events..   5. Nonischemic pharmacological stress electrocardiogram.     Last heart catheterization:    Last echocardiogram: 09/16/2020:  Normal LV size and systolic function with an estimated ejection fraction of 60%  Normal RV size and function  The atria are normal in size  Normal valve morphology  Mild mitral regurgitation is present  Nondilated IVC  Normal estimated PA systolic pressure    Last CT/MRI: 09/25/2015:  1.  Mild to moderate but hemodynamically insignificant flat eccentric   calcific plaque throughout the proximal LAD. The proximal LAD fractional   flow reserve is 0.96. There is a short intracameral segment of the distal   LAD, a normal variant. By [facility] analysis there is no apparent   hemodynamically significant stenosis throughout the LAD and its major   diagonal branches. In the distal portions of the LAD and diagonal the   estimated FFR falls within the normal range at 0.85-0.86.   2.  The left circumflex is large but technically nondominant with no   significant hemodynamic obstruction. The small distal AV groove branch has   a borderline low normal FFR equal to 0.80.   3.  Technically right coronary artery has no significant hemodynamic   lesions no significant soft plaque. There is slight calcification of the   proximal portion of the right coronary artery but the distal FFR in the   PDA branch is in the normal range at 0.90.   4.  The visualized thoracic aorta is unremarkable.   5.  The pericardium is unremarkable there is no pericardial effusion.   6.  Left ventricular ejection fraction 56% with no regional wall motion   abnormality.     Conclusions:   There is venous insufficiency in the right saphenofemoral junction and the right mid to distal calf portions of the right great saphenous vein.  There is venous insufficiency in the left saphenofemoral junction and the left mid thigh and mid to distal calf portions of the left great saphenous vein  There is venous insufficiency in the right proximal and mid small saphenous vein  Bilateral small varicose venous tributaries with venous insufficiency in the calf  No thrombus or venous insufficiency in the remainder of the visualized deep and superficial veins of bilateral lower extremities.           Recommendations:   Consider nonthermal ablation of the right great saphenous vein if clinically indicated  Consider nonthermal ablation of the left great saphenous vein if clinically indicated  Consider ultrasound-guided foam therapy or microphlebectomy of bilateral varicose venous tributaries if clinically indicated    Left lower extremity venous study on 04/13/2020:  Conclusions: No evidence of deep venous thrombosis bilaterally.   The right great saphenous vein  has venous reflux isolated below the knee.   There is a 3.16mm perforator with venous reflux located 25cm from the right   heel.   There is a 3.32mm perforator with venous reflux located 15cm from the right   heel.   The left great saphenous vein has venous reflux.   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   IMPRESSION   Right: No evidence of deep vein thrombosis nor deep venous reflux of the   common femoral, proximal profunda, femoral, or popliteal veins. There is   no evidence of great or small saphenous vein thrombosis. There is great   saphenous venous reflux. There is no evidence of small saphenous venous   insufficiency. There is an anterior duplicate great saphenous vein without   reflux. The anterior duplicate great saphenous vein exits the fascia 10cm   from the junction. There is a 3.74mm perforator with 3.5 seconds of venous   reflux located 25cm from the heel. There is another 3.10mm perforator with   2.7 seconds of venous reflux located 15cm from the heel. Varicose   tributaries with >0.5 seconds of reflux below the knee measuring 1.50mm -   2.23mm.   Left: No evidence of deep vein thrombosis nor deep venous reflux of the   common femoral, proximal profunda, femoral, or popliteal veins. There is   no evidence of great or small saphenous vein thrombosis. There is great   saphenous venous reflux. There is no evidence of small saphenous venous   insufficiency. There is a posterior duplicate great saphenous vein without   reflux. The posterior duplicate great saphenous vein exits the fascia 10cm   from the junction. There is a 3.90mm perforator without evidence of venous   reflux located 20cm from the heel. There were no venous tributaries seen   on today's exam.   General: The patient was scanned in either standing position/reverse   trendelenburg/or sitting with augmentation/Valsalva maneuvers to evaluate   the saphenous systems.    VENOUS REFLUX CRITERIA: Venous reflux is   demonstrated by audio and visual retrograde flow greater than 500   milliseconds in the great and small saphenous veins.  Reflux severity   stratified as:   MILD  0.5 - 1 seconds,  MODERATE  1 - 2 seconds,  SEVERE    >2 seconds. Venous tributaries documented demonstrate >0.5 seconds of   reflux.    Recommendations: Consider endovenous ablation of the left great saphenous   vein.   Consider endovenous ablation of the right perforator veins.   Consider treatment of the right leg accessory saphenous branch veins   producing varicose tributaries with microphlebectomy or ultrasound guided   microfoam injections if clinically indicated.     Last Lipid profile and pertinent labs:   Lab Level  Lab Results   Component Value Date    CHOL 137 09/25/2023     Lab Results   Component Value Date    LDL 78 09/25/2023     No results found for: LIPOPROTA  Lab Results   Component Value Date    HDL 43 09/25/2023     No results found for: [facility]  Lab Results   Component Value Date    TRIG 81 09/25/2023     Hemoglobin   Date Value Ref Range Status   09/25/2023 12.8  Final     Platelet Count   Date Value Ref Range Status   09/25/2023 132 (L) 182 - 369 Final  Creatinine   Date Value Ref Range Status   09/25/2023 0.81  Final     Potassium   Date Value Ref Range Status   09/25/2023 4.0  Final     TSH   Date Value Ref Range Status   09/25/2023 1.06  Final     AST (SGOT)   Date Value Ref Range Status   09/25/2023 23  Final     ALT (SGPT)   Date Value Ref Range Status   09/25/2023 21  Final     Magnesium   Date Value Ref Range Status 08/04/2022 1.6  Final        Total time spent on today's office visit was >31 minutes including >50% of time time involved in counseling.  This includes face-to-face in person visit with patient as well as nonface-to-face time including review of the EMR, outside records, labs, radiologic studies, echocardiogram & other cardiovascular studies, formation of treatment plan, after visit summary, future disposition, and lastly on documentation.    Staff name:  Ozell JINNY Lins, DO Date:  01/08/2024                 Vitals:    01/08/24 0756   BP: (!) 135/92   BP Source: Arm, Left Upper   Pulse: 79   SpO2: 96%   O2 Device: None (Room air)   PainSc: Zero   Weight: 90.6 kg (199 lb 12.8 oz)   Height: 172.7 cm (5' 8)     Body mass index is 30.38 kg/m?SABRA     Past Medical History  Patient Active Problem List    Diagnosis Date Noted    Pain in unspecified lower leg 03/22/2023    Peripheral vascular disease 03/22/2023    Lymphedema due to venous insufficiency 12/12/2022     08/31/2022-PV Venous Insufficiency Bilateral Lower Extremity-Theodosia-Conclusions: There is venous insufficiency in the right saphenofemoral junction and the right mid to distal calf portions of the right great saphenous vein.There is venous insufficiency in the left saphenofemoral junction and the left mid thigh and mid to distal calf portions of the left great saphenous vein There is venous insufficiency in the right proximal and mid small saphenous vein  Bilateral small varicose venous tributaries with venous insufficiency in the calf No thrombus or venous insufficiency in the remainder of the visualized deep and superficial veins of bilateral lower extremities. 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  5.58min o9n TM, no CP. EF 59%, no ischemia  03/17  Ex Echo at [facility] - no ischemia, 6 min on TM, TDS, no CP, no ECG changes, EF normal  06/17  Coronary Artery AJ-130 Score: Total 234.  LAD 206, RCA 28  08/17 CT Cor  angiogram at [place] EF 56%, chambers and valves normal, LAD-moderate concentric flat plaque, FFR 0.86; LCx mild plaque, RCA normal.  Thoracic aorta normal.      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 Teresa Trevino had no complaints prior to her screening mammogram. A new right breast asymmetry was present on screening mammogram. There are was suspicious on diagnostic imaging and biopsy was recommended. Right breast sono-guided biopsy 01/19/17 ([facility]) revealed grade 3 invasive ductal carcinoma. Ms. Overturf underwent right RSL lumpectomy/SLNB on 07/06/17. She finished radiation with Dr. Mariam on 10/05/17.    PATHOLOGY:  Tumor:  Size/Extent of Tumor Bed: 1.2 x 0.6 cm   Size/Extent of Residual Invasive Tumor: No invasive tumor identified   Overall Residual Cellularity of the Tumor Bed: <1%   1.5 mm DCIS present  Margins Free From Tumor:  Yes  ER:  negative   PR:  negative    Her 2:  negative  Grade:  3  Lymph Nodes:  0/4  LVSI:  no  Extranodal extension:  no      BREAST IMAGING:  Mammogram:    -- Bilateral screening mammogram 12/20/16 ([facility]) revealed scattered fibroglandular densities. There was a focal asymmetry present within the central posterior right breast. Additional diagnostic images and ultrasound were recommended. No abnormalities were present on the left breast.  -- Right diagnostic mammogram 01/04/17 ([facility]) revealed an approximately 1.2 cm x 0.6 cm x 0.5 cm low-density mass at 4:00 with associated calcifications. This was considered suspicious and further evaluation with ultrasound will be performed.    Ultrasound:    -- Targeted right breast ultrasound 01/04/17 ([facility]) revealed at 4:00 there was an irregular hypoechoic mass measuring approximately 6 mm x 9 mm x 8 mm with associated microcalcifications. This was thought to likely correlate with the mammographic abnormality and was considered somewhat suspicious. Ultrasound-guided biopsy was recommended. If the clip marker form sono guided biopsy of this mass did not correlate to the mammographic finding, additional stereotactic biopsy might be required. These findings recommendations were discussed in detail with the patient at the time of today's procedure.     REPRODUCTIVE HEALTH:  Age at first Menarche: Unknown   Age at Menopause:  Hysterectomy/BSO at 57, HRT cream for several months    PROCEDURE: Right RSL lumpectomy/SLNB, 07/06/17  PERTINENT PMH:  HTN, chronic leukopenia  FAMILY HISTORY:  Maternal Grandmother- Breast cancer dx age late 85s  PHYSICAL EXAM on PRESENTATION: Right - No palpable breast masses. No skin, nipple, or areolar change. Left - No palpable breast masses. No skin, nipple, or areolar change. No supraclavicular or axillary adenopathy.   MEDICAL ONCOLOGY:  Dr. Lafaye Dawn NEOADJUVANT THERAPY: Taxotere dennice completed 06/01/17  REFERRED BY:  Melissa Huntington PA        Nocturia 01/2017    Post-void dribbling 01/2017    Lichen sclerosus et atrophicus of the vulva 09/22/2015    Agatston coronary artery calcium score between 200 and 399 08/21/2015    Hypercholesterolemia 08/20/2015    Vulvar lesion 03/12/2015    Recurrent UTI 09/11/2014    Dyspareunia, female 05/13/2013    Vaginal atrophy 05/13/2013    Urinary incontinence, mixed 05/13/2013  Mixed incontinence urge and stress (female)(female) 10/19/2011         Review of Systems   Constitutional: Negative.   HENT:  Positive for tinnitus.    Eyes: Negative.    Cardiovascular:  Positive for leg swelling.   Respiratory: Negative.     Endocrine: Negative.    Hematologic/Lymphatic: Negative.    Musculoskeletal:  Positive for back pain and myalgias.   Genitourinary:         Frequent UTI's   Neurological: Negative.    Psychiatric/Behavioral: Negative.     Allergic/Immunologic: Negative.        Physical Exam   Constitutional: Olena appears well-developed. No distress.   HENT:   Head: Normocephalic and atraumatic. Mouth/Throat: Mucous membranes are moist.   Eyes: No scleral icterus.   Neck: No JVD present. Carotid bruit is not present.   Cardiovascular: Normal rate and regular rhythm.   No murmur heard.  Scarring on the LLE anterior shin. Varicosities and spider veins. Trace b/l Edema.    Pulmonary/Chest: Effort normal and breath sounds normal. No respiratory distress. Sherl has no wheezes.   Abdominal: Soft. Bowel sounds are normal. Irelynd exhibits no distension. There is no abdominal tenderness.   Musculoskeletal:      Right lower leg: Edema present.      Left lower leg: Edema present.   Neurological: Jermiya is alert and oriented to person, place, and time. Coordination normal.   Skin: Skin is warm and dry. Lakeeta is not diaphoretic. No pallor.   Psychiatric: Jade's behavior is normal. Mood, judgment and thought content normal.         Cardiovascular Studies      Cardiovascular Health Factors  Vitals BP Readings from Last 3 Encounters: 01/08/24 (!) 135/92   11/15/23 121/73   09/04/23 (!) 154/86     Wt Readings from Last 3 Encounters:   01/08/24 90.6 kg (199 lb 12.8 oz)   11/15/23 93.4 kg (206 lb)   09/04/23 92.7 kg (204 lb 6.4 oz)     BMI Readings from Last 3 Encounters:   01/08/24 30.38 kg/m?   11/15/23 31.32 kg/m?   09/04/23 31.08 kg/m?      Smoking Social History     Tobacco Use   Smoking Status Never   Smokeless Tobacco Never      Lipid Profile Cholesterol   Date Value Ref Range Status   09/25/2023 137  Final     HDL   Date Value Ref Range Status   09/25/2023 43  Final     LDL   Date Value Ref Range Status   09/25/2023 78  Final     Triglycerides   Date Value Ref Range Status   09/25/2023 81  Final      Blood Sugar No results found for: HGBA1C  Glucose   Date Value Ref Range Status   09/25/2023 89  Final   08/04/2022 93  Final   07/04/2022 100  Final          Problems Addressed Today  Encounter Diagnoses   Name Primary?    Screening for heart disease Yes    Venous insufficiency (chronic) (peripheral)     Statin intolerance     Lymphedema due to venous insufficiency     Hypercholesterolemia     Agatston coronary artery calcium score between 200 and 399                  Current Medications (including today's revisions)   acetaminophen  (TYLENOL   EXTRA STRENGTH) 500 mg tablet Take one tablet by mouth as Needed for Pain. Max of 4,000 mg of acetaminophen  in 24 hours.    albuterol  sulfate (PROAIR  HFA) 90 mcg/actuation HFA aerosol inhaler Inhale two puffs by mouth into the lungs every 12 hours as needed.    BENEFIBER (GUAR GUM) PO Take 2 Gummy by mouth daily.    cholecalciferol (vitamin D3) (OPTIMAL D3) 50,000 units capsule Take one capsule by mouth every 7 days.    cholecalciferol (vitD3)/vit K2 (VITAMIN D3-VITAMIN K2 PO) Take 2 drops by mouth twice weekly. (Patient not taking: Reported on 01/08/2024)    clobetasoL  (TEMOVATE ) 0.05 % topical ointment APPLY TO THE AFFECTED AREA(S) TWICE DAILY for 2-4 weeks then taper to three times a week AS NEEDED (Patient taking differently: three times weekly. APPLY TO THE AFFECTED AREA(S) TWICE DAILY for 2-4 weeks then taper to three times a week AS NEEDED)    estradioL  (ESTRACE ) 0.01 % (0.1 mg/g) vaginal cream Insert or Apply one g to vaginal area twice weekly.    ibuprofen (ADVIL) 200 mg tablet Take one tablet by mouth as Needed for Pain. Take with food.    losartan  (COZAAR ) 50 mg tablet Take one tablet by mouth daily.    moxifloxacin (VIGAMOX) 0.5 % ophthalmic solution Apply one drop to right eye as directed four times daily.    omeprazole  DR (PRILOSEC) 20 mg capsule TAKE ONE CAPSULE BY MOUTH EVERY DAY before breakfast    phenazopyridine  (PYRIDIUM ) 200 mg tablet Take one tablet by mouth three times daily as needed for Pain. Take after meals for up to 2 days. (Patient not taking: Reported on 01/08/2024)    psyllium husk (with sugar) (METAMUCIL (WITH SUGAR)) 3.4 gram packet Take one packet by mouth as Needed. (Patient not taking: Reported on 01/08/2024)    REPATHA  SYRINGE 140 mg/mL injectable SYRINGE inject 1ml under the skin every 14 days

## 2024-02-06 ENCOUNTER — Encounter: Admit: 2024-02-06 | Discharge: 2024-02-06 | Payer: MEDICARE

## 2024-02-16 ENCOUNTER — Encounter: Admit: 2024-02-16 | Discharge: 2024-02-16 | Payer: MEDICARE

## 2024-02-16 NOTE — Progress Notes [1]
 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     Your prior genetic testing from 2019 was completed by the laboratory [facility] and analyzed 84 genes. 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 This means that your genetic variant is not suspected to increase the risk of cancer.      No clinical action is indicated based on this changed interpretation of the MUTYH variant.  There is no change in classification to the other variant(s) found on your previous testing in the TERT (c.1729C>T) VUS.    Laboratories that perform genetic testing periodically review and reinterpret genetic test results. A Variant of Uncertain Significance (VUS) is a change in a gene that has an unknown effect on a person's health.      As genetic information grows, reclassifications are made based on new publications in the medical literature, new technology, and new ways to analyze the genes and their products / functions.  As more individuals are tested, more knowledge is added to the international databases.     Variants fall into five categories:      PATHOGENIC: This is a genetic variant that has sufficient evidence to be classified as capable of causing a disease (such as cancer). Targeted testing of at-risk family members and appropriate changes in medical management (i.e. high risk surveillance) for pathogenic mutation carriers is recommended. LIKELY PATHOGENIC: This is a variant that falls short of absolute proof that it is disease causing. The likelihood that it is pathogenic is greater than 90%. Targeted testing of at-risk family members and appropriate changes in medical management (i.e. high risk surveillance) for likely pathogenic mutation carriers is recommended.     VARIANT OF UNCERTAIN SIGNIFICANCE (VUS): As the name indicates, this result is the most ambiguous. A VUS is a change in the gene that has either never been seen before or has been seen so seldom that it is not clear if the alteration is linked with the condition or not. Data is either not available or is inconsistent for whether the change alters the protein. The expectation is that most VUS results will eventually have enough data to be reclassified to pathogenic or benign. Approximately 90% of VUS results are estimated to be downgraded to likely benign. Medical management should be based on personal and family clinical histories, not VUS carrier status.    LIKELY BENIGN: These are variants where the likelihood is greater than 90% that the gene is not altered in function. Targeted testing of at-risk family members is not recommended. Medical management should be based on personal and family clinical histories.    BENIGN: There is strong evidence that the variant does not affect the gene?s function. Often this is because the change has been seen in studies of healthy individuals, or because the change is unlikely to alter the protein product of the gene. Targeted testing of at-risk family members is  not recommended. Medical management should be based on personal and family clinical histories.                                                                                                                                                                    New genetic test panels may have become available since your testing was performed. Additional genetic testing on you, or your family members, may be considered, depending on your personal and/or family history of cancer, and any new family diagnoses. For example, if any of the following apply to you, you may wish to consider additional genetic testing through our [facility].  If you had testing for a limited number of genes, and have a strong family history of cancer  If a close family member has more than 10 - 20 colon polyps  If you, or a family member, have been diagnosed with another cancer after your testing, especially if...  the relative is under age 34 at diagnosis  the relative has a cancer associated with your cancer or another hereditary cancer syndrome  the relative has a cancer for which new testing, or a new insurance guideline, is available (for example, kidney, adrenal, prostate, uterine, melanoma or ovarian cancer, as well as possibly other cancers)    Questions?  Please let us  know if you have any questions about this amended report. You can contact us  at [phone] to make an appointment for a return visit to thoroughly review your current family history, past testing, and any additional testing options. A copy of this amended test report will be added to your electronic medical record.     What else can you do with this information? Consider entering data into one or all of the following registries to advance knowledge of genetic disorders by sharing de-identifying genetic and health information.     These online registries are a joint effort between laboratories and academic medical centers to learn more about genes, with the help of patients who have had genetic panel testing and are found to have variants of uncertain significance. While all of the genes on the panels are associated with an increased risk of cancer, the understanding of these risks associated with some of the genes are understood more than the others. Participation involves completing online surveys and patients can enroll directly through these websites:     PROMPT registry www.promptstudy.org  GenomeConnect- www.genomeconnect.org   ClinVar  (simplespeech.[email or web address]) or other variant classification database     [facility] Certified Genetic Counselors:                 Devin Cox, MS, CGC   Comer Knuckles, MS, CGC  Jordyn Koehn MS, CGC  [place] MS, CGC  [place]  Countee MS, CGC  [employer] MS, CGC  Feliciano Gin, MS, [facility]    Appointments: [phone]                                                                                                                                                                  Family Members can find a local board certified genetic counselor through these professional sites:  American Board of Genetic Counseling (Cartridgeclearance.com.cy)  ?Find a Counselor? link  Therapist, Music ([email or web address])  ?Find a Dentist? link

## 2024-02-27 ENCOUNTER — Encounter: Admit: 2024-02-27 | Discharge: 2024-02-27 | Payer: MEDICARE

## 2024-03-12 ENCOUNTER — Encounter: Admit: 2024-03-12 | Discharge: 2024-03-12 | Payer: MEDICARE

## 2024-03-12 MED ORDER — REPATHA SYRINGE 140 MG/ML SC SYRG
140 mg | SUBCUTANEOUS | 11 refills | 28.00000 days | Status: AC
Start: 2024-03-12 — End: ?

## 2024-03-14 ENCOUNTER — Encounter: Admit: 2024-03-14 | Discharge: 2024-03-14 | Payer: MEDICARE

## 2024-03-17 ENCOUNTER — Encounter: Admit: 2024-03-17 | Discharge: 2024-03-17 | Payer: MEDICARE
